# Patient Record
Sex: Female | Born: 1935 | Race: White | Hispanic: No | Smoking: Former smoker
Health system: Southern US, Community
[De-identification: ages and names within clinical notes are randomized; demographics above are authoritative.]

## PROBLEM LIST (undated history)

## (undated) DIAGNOSIS — E785 Hyperlipidemia, unspecified: Secondary | ICD-10-CM

## (undated) DIAGNOSIS — K602 Anal fissure, unspecified: Secondary | ICD-10-CM

## (undated) DIAGNOSIS — I4819 Other persistent atrial fibrillation: Secondary | ICD-10-CM

## (undated) DIAGNOSIS — K222 Esophageal obstruction: Secondary | ICD-10-CM

## (undated) DIAGNOSIS — G4700 Insomnia, unspecified: Secondary | ICD-10-CM

## (undated) DIAGNOSIS — Z9889 Other specified postprocedural states: Secondary | ICD-10-CM

## (undated) DIAGNOSIS — N289 Disorder of kidney and ureter, unspecified: Secondary | ICD-10-CM

## (undated) DIAGNOSIS — I679 Cerebrovascular disease, unspecified: Secondary | ICD-10-CM

## (undated) DIAGNOSIS — K449 Diaphragmatic hernia without obstruction or gangrene: Secondary | ICD-10-CM

## (undated) DIAGNOSIS — K589 Irritable bowel syndrome without diarrhea: Secondary | ICD-10-CM

## (undated) DIAGNOSIS — M545 Low back pain, unspecified: Secondary | ICD-10-CM

## (undated) DIAGNOSIS — K219 Gastro-esophageal reflux disease without esophagitis: Secondary | ICD-10-CM

## (undated) DIAGNOSIS — K5790 Diverticulosis of intestine, part unspecified, without perforation or abscess without bleeding: Secondary | ICD-10-CM

## (undated) DIAGNOSIS — C44301 Unspecified malignant neoplasm of skin of nose: Secondary | ICD-10-CM

## (undated) DIAGNOSIS — K259 Gastric ulcer, unspecified as acute or chronic, without hemorrhage or perforation: Secondary | ICD-10-CM

## (undated) DIAGNOSIS — M1990 Unspecified osteoarthritis, unspecified site: Secondary | ICD-10-CM

## (undated) DIAGNOSIS — G8929 Other chronic pain: Secondary | ICD-10-CM

## (undated) DIAGNOSIS — I10 Essential (primary) hypertension: Secondary | ICD-10-CM

## (undated) DIAGNOSIS — S82891A Other fracture of right lower leg, initial encounter for closed fracture: Secondary | ICD-10-CM

## (undated) DIAGNOSIS — D126 Benign neoplasm of colon, unspecified: Secondary | ICD-10-CM

## (undated) DIAGNOSIS — J42 Unspecified chronic bronchitis: Secondary | ICD-10-CM

## (undated) DIAGNOSIS — Z9289 Personal history of other medical treatment: Secondary | ICD-10-CM

## (undated) DIAGNOSIS — R011 Cardiac murmur, unspecified: Secondary | ICD-10-CM

## (undated) DIAGNOSIS — R112 Nausea with vomiting, unspecified: Secondary | ICD-10-CM

## (undated) DIAGNOSIS — C649 Malignant neoplasm of unspecified kidney, except renal pelvis: Secondary | ICD-10-CM

## (undated) DIAGNOSIS — F411 Generalized anxiety disorder: Secondary | ICD-10-CM

## (undated) DIAGNOSIS — G629 Polyneuropathy, unspecified: Secondary | ICD-10-CM

## (undated) HISTORY — DX: Unspecified malignant neoplasm of skin of nose: C44.301

## (undated) HISTORY — DX: Malignant neoplasm of unspecified kidney, except renal pelvis: C64.9

## (undated) HISTORY — DX: Disorder of kidney and ureter, unspecified: N28.9

## (undated) HISTORY — DX: Unspecified osteoarthritis, unspecified site: M19.90

## (undated) HISTORY — DX: Esophageal obstruction: K22.2

## (undated) HISTORY — DX: Generalized anxiety disorder: F41.1

## (undated) HISTORY — DX: Personal history of other medical treatment: Z92.89

## (undated) HISTORY — DX: Essential (primary) hypertension: I10

## (undated) HISTORY — PX: SKIN CANCER EXCISION: SHX779

## (undated) HISTORY — PX: OTHER SURGICAL HISTORY: SHX169

## (undated) HISTORY — DX: Benign neoplasm of colon, unspecified: D12.6

## (undated) HISTORY — PX: CHOLECYSTECTOMY: SHX55

## (undated) HISTORY — DX: Polyneuropathy, unspecified: G62.9

## (undated) HISTORY — DX: Hyperlipidemia, unspecified: E78.5

## (undated) HISTORY — DX: Irritable bowel syndrome without diarrhea: K58.9

## (undated) HISTORY — DX: Cerebrovascular disease, unspecified: I67.9

## (undated) HISTORY — PX: TONSILLECTOMY: SUR1361

## (undated) HISTORY — DX: Anal fissure, unspecified: K60.2

## (undated) HISTORY — PX: APPENDECTOMY: SHX54

## (undated) HISTORY — DX: Gastric ulcer, unspecified as acute or chronic, without hemorrhage or perforation: K25.9

## (undated) HISTORY — DX: Diverticulosis of intestine, part unspecified, without perforation or abscess without bleeding: K57.90

## (undated) HISTORY — DX: Insomnia, unspecified: G47.00

## (undated) HISTORY — PX: CATARACT EXTRACTION W/ INTRAOCULAR LENS  IMPLANT, BILATERAL: SHX1307

---

## 1975-10-01 HISTORY — PX: ABDOMINAL HYSTERECTOMY: SHX81

## 1987-10-01 HISTORY — PX: ANTERIOR AND POSTERIOR VAGINAL REPAIR: SUR5

## 1998-04-06 ENCOUNTER — Observation Stay (HOSPITAL_COMMUNITY): Admission: RE | Admit: 1998-04-06 | Discharge: 1998-04-07 | Payer: Self-pay | Admitting: *Deleted

## 1998-04-16 ENCOUNTER — Emergency Department (HOSPITAL_COMMUNITY): Admission: EM | Admit: 1998-04-16 | Discharge: 1998-04-16 | Payer: Self-pay | Admitting: Internal Medicine

## 1999-02-14 ENCOUNTER — Encounter: Payer: Self-pay | Admitting: Gastroenterology

## 1999-02-14 ENCOUNTER — Ambulatory Visit (HOSPITAL_COMMUNITY): Admission: RE | Admit: 1999-02-14 | Discharge: 1999-02-14 | Payer: Self-pay | Admitting: Gastroenterology

## 1999-08-23 ENCOUNTER — Encounter: Payer: Self-pay | Admitting: Emergency Medicine

## 1999-08-23 ENCOUNTER — Emergency Department (HOSPITAL_COMMUNITY): Admission: EM | Admit: 1999-08-23 | Discharge: 1999-08-23 | Payer: Self-pay | Admitting: Emergency Medicine

## 2000-05-08 ENCOUNTER — Encounter: Admission: RE | Admit: 2000-05-08 | Discharge: 2000-05-08 | Payer: Self-pay | Admitting: Pulmonary Disease

## 2000-05-08 ENCOUNTER — Encounter: Payer: Self-pay | Admitting: Pulmonary Disease

## 2000-12-18 ENCOUNTER — Emergency Department (HOSPITAL_COMMUNITY): Admission: EM | Admit: 2000-12-18 | Discharge: 2000-12-19 | Payer: Self-pay | Admitting: Emergency Medicine

## 2002-12-09 ENCOUNTER — Emergency Department (HOSPITAL_COMMUNITY): Admission: EM | Admit: 2002-12-09 | Discharge: 2002-12-10 | Payer: Self-pay | Admitting: Emergency Medicine

## 2003-08-09 ENCOUNTER — Inpatient Hospital Stay (HOSPITAL_COMMUNITY): Admission: RE | Admit: 2003-08-09 | Discharge: 2003-08-10 | Payer: Self-pay | Admitting: Orthopedic Surgery

## 2004-05-31 ENCOUNTER — Emergency Department (HOSPITAL_COMMUNITY): Admission: EM | Admit: 2004-05-31 | Discharge: 2004-05-31 | Payer: Self-pay | Admitting: Emergency Medicine

## 2004-07-31 ENCOUNTER — Ambulatory Visit: Payer: Self-pay | Admitting: Cardiology

## 2004-09-27 ENCOUNTER — Ambulatory Visit: Payer: Self-pay | Admitting: Pulmonary Disease

## 2004-11-05 ENCOUNTER — Ambulatory Visit: Payer: Self-pay | Admitting: Pulmonary Disease

## 2004-11-06 ENCOUNTER — Ambulatory Visit: Payer: Self-pay | Admitting: Cardiology

## 2004-12-11 ENCOUNTER — Ambulatory Visit: Payer: Self-pay | Admitting: Pulmonary Disease

## 2004-12-26 ENCOUNTER — Ambulatory Visit: Payer: Self-pay | Admitting: *Deleted

## 2005-01-07 ENCOUNTER — Ambulatory Visit: Payer: Self-pay | Admitting: Family Medicine

## 2005-01-14 ENCOUNTER — Ambulatory Visit: Payer: Self-pay | Admitting: Cardiology

## 2005-03-27 ENCOUNTER — Ambulatory Visit: Payer: Self-pay | Admitting: Pulmonary Disease

## 2005-04-09 ENCOUNTER — Ambulatory Visit: Payer: Self-pay | Admitting: *Deleted

## 2005-05-07 ENCOUNTER — Ambulatory Visit: Payer: Self-pay | Admitting: Pulmonary Disease

## 2005-05-07 ENCOUNTER — Inpatient Hospital Stay (HOSPITAL_COMMUNITY): Admission: EM | Admit: 2005-05-07 | Discharge: 2005-05-09 | Payer: Self-pay | Admitting: Emergency Medicine

## 2005-05-08 ENCOUNTER — Ambulatory Visit: Payer: Self-pay | Admitting: Cardiology

## 2005-05-09 ENCOUNTER — Encounter: Payer: Self-pay | Admitting: Cardiology

## 2005-05-09 ENCOUNTER — Ambulatory Visit: Payer: Self-pay | Admitting: Pulmonary Disease

## 2005-05-13 ENCOUNTER — Ambulatory Visit: Payer: Self-pay | Admitting: Pulmonary Disease

## 2005-05-17 ENCOUNTER — Ambulatory Visit: Payer: Self-pay | Admitting: Internal Medicine

## 2005-05-21 ENCOUNTER — Ambulatory Visit: Payer: Self-pay | Admitting: Cardiology

## 2005-05-22 ENCOUNTER — Ambulatory Visit: Payer: Self-pay | Admitting: Pulmonary Disease

## 2005-05-29 ENCOUNTER — Ambulatory Visit: Payer: Self-pay

## 2005-06-05 ENCOUNTER — Ambulatory Visit: Payer: Self-pay | Admitting: Cardiology

## 2005-06-11 ENCOUNTER — Ambulatory Visit: Payer: Self-pay | Admitting: Pulmonary Disease

## 2005-06-14 ENCOUNTER — Ambulatory Visit: Payer: Self-pay | Admitting: Internal Medicine

## 2005-06-25 ENCOUNTER — Ambulatory Visit: Payer: Self-pay | Admitting: *Deleted

## 2005-07-01 ENCOUNTER — Ambulatory Visit: Payer: Self-pay | Admitting: Cardiology

## 2005-07-11 ENCOUNTER — Ambulatory Visit: Payer: Self-pay | Admitting: Cardiology

## 2005-07-18 ENCOUNTER — Ambulatory Visit: Payer: Self-pay | Admitting: Cardiology

## 2005-08-01 ENCOUNTER — Ambulatory Visit: Payer: Self-pay | Admitting: Cardiology

## 2005-08-06 ENCOUNTER — Ambulatory Visit: Payer: Self-pay | Admitting: Cardiology

## 2005-08-19 ENCOUNTER — Ambulatory Visit: Payer: Self-pay | Admitting: Cardiology

## 2005-08-19 ENCOUNTER — Ambulatory Visit: Payer: Self-pay | Admitting: Pulmonary Disease

## 2005-08-28 ENCOUNTER — Ambulatory Visit: Payer: Self-pay | Admitting: Pulmonary Disease

## 2005-10-02 ENCOUNTER — Ambulatory Visit: Payer: Self-pay | Admitting: Cardiology

## 2005-10-28 ENCOUNTER — Ambulatory Visit: Payer: Self-pay | Admitting: Pulmonary Disease

## 2005-10-29 ENCOUNTER — Ambulatory Visit: Payer: Self-pay | Admitting: Cardiology

## 2005-10-30 ENCOUNTER — Ambulatory Visit: Payer: Self-pay | Admitting: Cardiology

## 2005-11-08 ENCOUNTER — Encounter: Payer: Self-pay | Admitting: Cardiovascular Disease

## 2005-11-08 ENCOUNTER — Ambulatory Visit: Payer: Self-pay | Admitting: Cardiovascular Disease

## 2005-11-20 ENCOUNTER — Ambulatory Visit: Payer: Self-pay | Admitting: Pulmonary Disease

## 2005-11-22 ENCOUNTER — Ambulatory Visit: Payer: Self-pay | Admitting: Internal Medicine

## 2005-11-27 ENCOUNTER — Ambulatory Visit: Payer: Self-pay | Admitting: Cardiology

## 2005-12-11 ENCOUNTER — Ambulatory Visit: Payer: Self-pay | Admitting: Cardiology

## 2005-12-23 ENCOUNTER — Ambulatory Visit: Payer: Self-pay | Admitting: Pulmonary Disease

## 2005-12-24 ENCOUNTER — Ambulatory Visit (HOSPITAL_COMMUNITY): Admission: RE | Admit: 2005-12-24 | Discharge: 2005-12-24 | Payer: Self-pay | Admitting: Pulmonary Disease

## 2005-12-25 ENCOUNTER — Ambulatory Visit: Payer: Self-pay | Admitting: *Deleted

## 2006-01-15 ENCOUNTER — Ambulatory Visit: Payer: Self-pay | Admitting: *Deleted

## 2006-01-25 ENCOUNTER — Encounter: Admission: RE | Admit: 2006-01-25 | Discharge: 2006-01-25 | Payer: Self-pay | Admitting: Neurology

## 2006-02-10 ENCOUNTER — Ambulatory Visit: Payer: Self-pay | Admitting: Cardiology

## 2006-03-10 ENCOUNTER — Ambulatory Visit: Payer: Self-pay | Admitting: Cardiology

## 2006-04-10 ENCOUNTER — Ambulatory Visit: Payer: Self-pay | Admitting: Cardiology

## 2006-04-22 ENCOUNTER — Ambulatory Visit: Payer: Self-pay | Admitting: Cardiology

## 2006-05-12 ENCOUNTER — Ambulatory Visit: Payer: Self-pay | Admitting: Cardiovascular Disease

## 2006-05-14 ENCOUNTER — Ambulatory Visit: Payer: Self-pay | Admitting: Cardiology

## 2006-05-19 ENCOUNTER — Ambulatory Visit: Payer: Self-pay | Admitting: Internal Medicine

## 2006-05-26 ENCOUNTER — Ambulatory Visit: Payer: Self-pay

## 2006-06-09 ENCOUNTER — Ambulatory Visit: Payer: Self-pay | Admitting: *Deleted

## 2006-06-20 ENCOUNTER — Ambulatory Visit: Payer: Self-pay | Admitting: Cardiology

## 2006-06-21 ENCOUNTER — Emergency Department (HOSPITAL_COMMUNITY): Admission: EM | Admit: 2006-06-21 | Discharge: 2006-06-21 | Payer: Self-pay | Admitting: Emergency Medicine

## 2006-06-24 ENCOUNTER — Ambulatory Visit: Payer: Self-pay | Admitting: Pulmonary Disease

## 2006-06-26 ENCOUNTER — Emergency Department (HOSPITAL_COMMUNITY): Admission: EM | Admit: 2006-06-26 | Discharge: 2006-06-27 | Payer: Self-pay | Admitting: Emergency Medicine

## 2006-07-01 ENCOUNTER — Ambulatory Visit: Payer: Self-pay | Admitting: Pulmonary Disease

## 2006-07-10 ENCOUNTER — Ambulatory Visit: Payer: Self-pay | Admitting: Cardiology

## 2006-08-07 ENCOUNTER — Ambulatory Visit: Payer: Self-pay | Admitting: Internal Medicine

## 2006-08-19 ENCOUNTER — Ambulatory Visit: Payer: Self-pay | Admitting: Internal Medicine

## 2006-08-20 ENCOUNTER — Ambulatory Visit: Payer: Self-pay | Admitting: Internal Medicine

## 2006-08-25 ENCOUNTER — Ambulatory Visit: Payer: Self-pay | Admitting: Cardiology

## 2006-09-05 ENCOUNTER — Ambulatory Visit: Payer: Self-pay | Admitting: Cardiology

## 2006-09-09 ENCOUNTER — Ambulatory Visit: Payer: Self-pay

## 2006-10-02 ENCOUNTER — Ambulatory Visit: Payer: Self-pay | Admitting: Cardiology

## 2006-10-15 ENCOUNTER — Ambulatory Visit: Payer: Self-pay | Admitting: Cardiology

## 2006-10-24 ENCOUNTER — Ambulatory Visit: Payer: Self-pay | Admitting: Internal Medicine

## 2006-11-11 ENCOUNTER — Ambulatory Visit: Payer: Self-pay | Admitting: Cardiology

## 2006-11-14 ENCOUNTER — Ambulatory Visit: Payer: Self-pay | Admitting: Cardiology

## 2006-11-25 ENCOUNTER — Ambulatory Visit: Payer: Self-pay | Admitting: Pulmonary Disease

## 2006-11-25 LAB — CONVERTED CEMR LAB
ALT: 21 units/L (ref 0–40)
AST: 24 units/L (ref 0–37)
Basophils Relative: 0.9 % (ref 0.0–1.0)
Bilirubin, Direct: 0.1 mg/dL (ref 0.0–0.3)
CO2: 30 meq/L (ref 19–32)
Calcium: 9.3 mg/dL (ref 8.4–10.5)
Chloride: 109 meq/L (ref 96–112)
Direct LDL: 138 mg/dL
Eosinophils Absolute: 0.2 10*3/uL (ref 0.0–0.6)
Eosinophils Relative: 3.6 % (ref 0.0–5.0)
GFR calc Af Amer: 69 mL/min
GFR calc non Af Amer: 57 mL/min
Glucose, Bld: 95 mg/dL (ref 70–99)
HCT: 41.1 % (ref 36.0–46.0)
HDL: 40.8 mg/dL (ref >39.0)
Lymphocytes Relative: 26 % (ref 12.0–46.0)
MCV: 91.1 fL (ref 78.0–100.0)
Neutro Abs: 2.9 10*3/uL (ref 1.4–7.7)
Neutrophils Relative %: 57.3 % (ref 43.0–77.0)
Platelets: 167 10*3/uL (ref 150–400)
Sodium: 145 meq/L (ref 135–145)
Total Protein: 6.4 g/dL (ref 6.0–8.3)
VLDL: 46 mg/dL — ABNORMAL HIGH (ref 0–40)
WBC: 5 10*3/uL (ref 4.5–10.5)

## 2006-12-03 ENCOUNTER — Ambulatory Visit: Payer: Self-pay | Admitting: *Deleted

## 2006-12-09 ENCOUNTER — Ambulatory Visit: Payer: Self-pay | Admitting: Pulmonary Disease

## 2006-12-11 ENCOUNTER — Ambulatory Visit: Payer: Self-pay | Admitting: Cardiology

## 2006-12-24 ENCOUNTER — Encounter: Admission: RE | Admit: 2006-12-24 | Discharge: 2006-12-24 | Payer: Self-pay | Admitting: Neurology

## 2007-01-13 ENCOUNTER — Ambulatory Visit: Payer: Self-pay | Admitting: Cardiovascular Disease

## 2007-01-27 ENCOUNTER — Ambulatory Visit: Payer: Self-pay | Admitting: Cardiology

## 2007-02-02 ENCOUNTER — Ambulatory Visit: Payer: Self-pay | Admitting: Cardiology

## 2007-02-16 ENCOUNTER — Ambulatory Visit: Payer: Self-pay | Admitting: Pulmonary Disease

## 2007-02-17 ENCOUNTER — Emergency Department (HOSPITAL_COMMUNITY): Admission: EM | Admit: 2007-02-17 | Discharge: 2007-02-17 | Payer: Self-pay | Admitting: Emergency Medicine

## 2007-02-17 LAB — CONVERTED CEMR LAB
LDL Cholesterol: 103 mg/dL — ABNORMAL HIGH (ref 0–99)
VLDL: 29 mg/dL (ref 0–40)

## 2007-02-19 ENCOUNTER — Ambulatory Visit: Payer: Self-pay | Admitting: Cardiology

## 2007-03-18 ENCOUNTER — Ambulatory Visit: Payer: Self-pay | Admitting: Cardiology

## 2007-04-15 ENCOUNTER — Ambulatory Visit: Payer: Self-pay | Admitting: Cardiovascular Disease

## 2007-05-01 ENCOUNTER — Ambulatory Visit: Payer: Self-pay | Admitting: Cardiology

## 2007-05-15 ENCOUNTER — Ambulatory Visit: Payer: Self-pay | Admitting: Internal Medicine

## 2007-06-12 ENCOUNTER — Ambulatory Visit: Payer: Self-pay | Admitting: Cardiology

## 2007-06-22 ENCOUNTER — Ambulatory Visit: Payer: Self-pay | Admitting: Pulmonary Disease

## 2007-06-23 ENCOUNTER — Ambulatory Visit: Payer: Self-pay | Admitting: Cardiology

## 2007-06-23 ENCOUNTER — Ambulatory Visit: Payer: Self-pay | Admitting: Pulmonary Disease

## 2007-06-23 LAB — CONVERTED CEMR LAB
ALT: 28 units/L (ref 0–35)
Basophils Absolute: 0.1 10*3/uL (ref 0.0–0.1)
Bilirubin, Direct: 0.1 mg/dL (ref 0.0–0.3)
Calcium: 10.4 mg/dL (ref 8.4–10.5)
Eosinophils Absolute: 0.3 10*3/uL (ref 0.0–0.6)
GFR calc Af Amer: 69 mL/min
GFR calc non Af Amer: 57 mL/min
Glucose, Bld: 99 mg/dL (ref 70–99)
HDL: 42.1 mg/dL (ref >39.0)
Hemoglobin, Urine: NEGATIVE
Ketones, ur: NEGATIVE mg/dL
MCHC: 34.3 g/dL (ref 30.0–36.0)
MCV: 91.1 fL (ref 78.0–100.0)
Monocytes Relative: 11.7 % — ABNORMAL HIGH (ref 3.0–11.0)
Neutrophils Relative %: 59.2 % (ref 43.0–77.0)
Platelets: 176 10*3/uL (ref 150–400)
Pro B Natriuretic peptide (BNP): 136 pg/mL — ABNORMAL HIGH (ref 0.0–100.0)
RBC: 4.45 M/uL (ref 3.87–5.11)
RDW: 12.5 % (ref 11.5–14.6)
Sodium: 147 meq/L — ABNORMAL HIGH (ref 135–145)
Specific Gravity, Urine: 1.01 (ref 1.000–1.03)
TSH: 4.93 microintl units/mL (ref 0.35–5.50)
Total CHOL/HDL Ratio: 4.5
Total Protein, Urine: NEGATIVE mg/dL
Triglycerides: 337 mg/dL (ref 0–149)
pH: 6.5 (ref 5.0–8.0)

## 2007-07-10 ENCOUNTER — Ambulatory Visit: Payer: Self-pay | Admitting: Internal Medicine

## 2007-07-29 ENCOUNTER — Ambulatory Visit: Payer: Self-pay | Admitting: Pulmonary Disease

## 2007-07-29 DIAGNOSIS — M1990 Unspecified osteoarthritis, unspecified site: Secondary | ICD-10-CM | POA: Insufficient documentation

## 2007-07-29 DIAGNOSIS — I4891 Unspecified atrial fibrillation: Secondary | ICD-10-CM

## 2007-07-29 DIAGNOSIS — K219 Gastro-esophageal reflux disease without esophagitis: Secondary | ICD-10-CM

## 2007-07-29 DIAGNOSIS — I10 Essential (primary) hypertension: Secondary | ICD-10-CM | POA: Insufficient documentation

## 2007-07-29 DIAGNOSIS — F411 Generalized anxiety disorder: Secondary | ICD-10-CM | POA: Insufficient documentation

## 2007-07-29 DIAGNOSIS — E7800 Pure hypercholesterolemia, unspecified: Secondary | ICD-10-CM

## 2007-08-10 ENCOUNTER — Ambulatory Visit: Payer: Self-pay | Admitting: Internal Medicine

## 2007-08-19 ENCOUNTER — Ambulatory Visit: Payer: Self-pay | Admitting: Cardiology

## 2007-08-19 LAB — CONVERTED CEMR LAB: Free T4: 0.7 ng/dL (ref 0.6–1.6)

## 2007-08-25 ENCOUNTER — Ambulatory Visit: Payer: Self-pay | Admitting: Pulmonary Disease

## 2007-09-07 ENCOUNTER — Telehealth: Payer: Self-pay | Admitting: Pulmonary Disease

## 2007-09-10 ENCOUNTER — Ambulatory Visit: Payer: Self-pay | Admitting: Pulmonary Disease

## 2007-09-11 ENCOUNTER — Ambulatory Visit: Payer: Self-pay | Admitting: Cardiology

## 2007-09-18 ENCOUNTER — Ambulatory Visit: Payer: Self-pay | Admitting: Cardiology

## 2007-10-09 ENCOUNTER — Telehealth (INDEPENDENT_AMBULATORY_CARE_PROVIDER_SITE_OTHER): Payer: Self-pay | Admitting: *Deleted

## 2007-10-16 ENCOUNTER — Ambulatory Visit: Payer: Self-pay | Admitting: Cardiology

## 2007-10-30 ENCOUNTER — Ambulatory Visit: Payer: Self-pay | Admitting: Pulmonary Disease

## 2007-11-06 ENCOUNTER — Ambulatory Visit: Payer: Self-pay | Admitting: Pulmonary Disease

## 2007-11-13 ENCOUNTER — Ambulatory Visit: Payer: Self-pay | Admitting: Internal Medicine

## 2007-11-27 ENCOUNTER — Ambulatory Visit: Payer: Self-pay | Admitting: Internal Medicine

## 2007-12-11 ENCOUNTER — Ambulatory Visit: Payer: Self-pay | Admitting: Internal Medicine

## 2007-12-18 ENCOUNTER — Ambulatory Visit: Payer: Self-pay | Admitting: Cardiovascular Disease

## 2008-01-10 ENCOUNTER — Emergency Department (HOSPITAL_COMMUNITY): Admission: EM | Admit: 2008-01-10 | Discharge: 2008-01-10 | Payer: Self-pay | Admitting: Emergency Medicine

## 2008-02-08 ENCOUNTER — Ambulatory Visit: Payer: Self-pay | Admitting: Cardiology

## 2008-02-10 ENCOUNTER — Ambulatory Visit: Payer: Self-pay | Admitting: Cardiology

## 2008-02-12 ENCOUNTER — Ambulatory Visit: Payer: Self-pay | Admitting: Pulmonary Disease

## 2008-02-12 LAB — CONVERTED CEMR LAB
Crystals: NEGATIVE
Ketones, ur: NEGATIVE mg/dL
Urine Glucose: NEGATIVE mg/dL
Urobilinogen, UA: 0.2 (ref 0.0–1.0)

## 2008-02-13 ENCOUNTER — Encounter: Payer: Self-pay | Admitting: Adult Health

## 2008-02-15 ENCOUNTER — Ambulatory Visit: Payer: Self-pay | Admitting: Internal Medicine

## 2008-02-18 ENCOUNTER — Ambulatory Visit: Payer: Self-pay | Admitting: Internal Medicine

## 2008-02-23 ENCOUNTER — Encounter: Payer: Self-pay | Admitting: Pulmonary Disease

## 2008-03-10 ENCOUNTER — Ambulatory Visit: Payer: Self-pay | Admitting: Cardiology

## 2008-03-15 ENCOUNTER — Telehealth (INDEPENDENT_AMBULATORY_CARE_PROVIDER_SITE_OTHER): Payer: Self-pay | Admitting: *Deleted

## 2008-04-07 ENCOUNTER — Ambulatory Visit: Payer: Self-pay | Admitting: Cardiology

## 2008-04-28 ENCOUNTER — Ambulatory Visit: Payer: Self-pay | Admitting: Cardiology

## 2008-05-05 ENCOUNTER — Ambulatory Visit: Payer: Self-pay | Admitting: Internal Medicine

## 2008-05-19 ENCOUNTER — Ambulatory Visit: Payer: Self-pay | Admitting: Internal Medicine

## 2008-06-16 ENCOUNTER — Ambulatory Visit: Payer: Self-pay | Admitting: Cardiology

## 2008-07-14 ENCOUNTER — Ambulatory Visit: Payer: Self-pay | Admitting: Internal Medicine

## 2008-07-28 ENCOUNTER — Ambulatory Visit: Payer: Self-pay | Admitting: Cardiovascular Disease

## 2008-08-04 ENCOUNTER — Ambulatory Visit: Payer: Self-pay | Admitting: Cardiology

## 2008-08-17 ENCOUNTER — Telehealth (INDEPENDENT_AMBULATORY_CARE_PROVIDER_SITE_OTHER): Payer: Self-pay | Admitting: *Deleted

## 2008-08-19 ENCOUNTER — Ambulatory Visit: Payer: Self-pay | Admitting: Cardiology

## 2008-09-02 ENCOUNTER — Ambulatory Visit: Payer: Self-pay | Admitting: Pulmonary Disease

## 2008-09-02 DIAGNOSIS — K5900 Constipation, unspecified: Secondary | ICD-10-CM | POA: Insufficient documentation

## 2008-09-02 DIAGNOSIS — N289 Disorder of kidney and ureter, unspecified: Secondary | ICD-10-CM | POA: Insufficient documentation

## 2008-09-03 DIAGNOSIS — I679 Cerebrovascular disease, unspecified: Secondary | ICD-10-CM

## 2008-09-08 ENCOUNTER — Ambulatory Visit: Payer: Self-pay | Admitting: Pulmonary Disease

## 2008-09-14 LAB — CONVERTED CEMR LAB
ALT: 23 units/L (ref 0–35)
AST: 22 units/L (ref 0–37)
Alkaline Phosphatase: 40 units/L (ref 39–117)
Basophils Absolute: 0 10*3/uL (ref 0.0–0.1)
Basophils Relative: 0.2 % (ref 0.0–3.0)
Bilirubin, Direct: 0.1 mg/dL (ref 0.0–0.3)
CO2: 30 meq/L (ref 19–32)
Chloride: 109 meq/L (ref 96–112)
Creatinine, Ser: 1 mg/dL (ref 0.4–1.2)
Ketones, ur: NEGATIVE mg/dL
LDL Cholesterol: 82 mg/dL (ref 0–99)
Leukocytes, UA: NEGATIVE
Lymphocytes Relative: 17.4 % (ref 12.0–46.0)
MCHC: 34.6 g/dL (ref 30.0–36.0)
Neutrophils Relative %: 70.6 % (ref 43.0–77.0)
Platelets: 158 10*3/uL (ref 150–400)
Potassium: 4.1 meq/L (ref 3.5–5.1)
RBC: 4.19 M/uL (ref 3.87–5.11)
Sodium: 143 meq/L (ref 135–145)
Specific Gravity, Urine: 1.02 (ref 1.000–1.03)
Total Bilirubin: 0.8 mg/dL (ref 0.3–1.2)
Total CHOL/HDL Ratio: 3.2
Triglycerides: 85 mg/dL (ref 0–149)
Urobilinogen, UA: 0.2 (ref 0.0–1.0)

## 2008-09-16 ENCOUNTER — Ambulatory Visit: Payer: Self-pay | Admitting: Cardiovascular Disease

## 2008-09-26 ENCOUNTER — Telehealth: Payer: Self-pay | Admitting: Pulmonary Disease

## 2008-09-26 ENCOUNTER — Telehealth (INDEPENDENT_AMBULATORY_CARE_PROVIDER_SITE_OTHER): Payer: Self-pay | Admitting: *Deleted

## 2008-10-13 ENCOUNTER — Ambulatory Visit: Payer: Self-pay | Admitting: Cardiovascular Disease

## 2008-10-25 ENCOUNTER — Telehealth (INDEPENDENT_AMBULATORY_CARE_PROVIDER_SITE_OTHER): Payer: Self-pay | Admitting: *Deleted

## 2008-11-08 ENCOUNTER — Ambulatory Visit: Payer: Self-pay | Admitting: Cardiology

## 2008-11-14 ENCOUNTER — Telehealth (INDEPENDENT_AMBULATORY_CARE_PROVIDER_SITE_OTHER): Payer: Self-pay | Admitting: *Deleted

## 2008-11-14 ENCOUNTER — Ambulatory Visit: Payer: Self-pay | Admitting: Internal Medicine

## 2008-11-14 ENCOUNTER — Ambulatory Visit: Payer: Self-pay | Admitting: Pulmonary Disease

## 2008-11-16 ENCOUNTER — Encounter: Payer: Self-pay | Admitting: Pulmonary Disease

## 2008-11-16 ENCOUNTER — Ambulatory Visit (HOSPITAL_COMMUNITY): Admission: RE | Admit: 2008-11-16 | Discharge: 2008-11-16 | Payer: Self-pay | Admitting: Urology

## 2008-11-28 ENCOUNTER — Ambulatory Visit: Payer: Self-pay | Admitting: Pulmonary Disease

## 2008-11-30 ENCOUNTER — Encounter: Payer: Self-pay | Admitting: Pulmonary Disease

## 2008-11-30 ENCOUNTER — Encounter: Admission: RE | Admit: 2008-11-30 | Discharge: 2008-11-30 | Payer: Self-pay | Admitting: Urology

## 2008-11-30 LAB — CONVERTED CEMR LAB
INR: 3 — ABNORMAL HIGH (ref 0.8–1.0)
Prothrombin Time: 30.6 s — ABNORMAL HIGH (ref 10.9–13.3)

## 2008-12-06 ENCOUNTER — Ambulatory Visit: Payer: Self-pay | Admitting: Cardiology

## 2008-12-13 ENCOUNTER — Ambulatory Visit: Payer: Self-pay | Admitting: Internal Medicine

## 2008-12-20 ENCOUNTER — Ambulatory Visit: Payer: Self-pay | Admitting: Cardiology

## 2008-12-20 ENCOUNTER — Ambulatory Visit: Payer: Self-pay | Admitting: Cardiovascular Disease

## 2009-01-05 ENCOUNTER — Telehealth (INDEPENDENT_AMBULATORY_CARE_PROVIDER_SITE_OTHER): Payer: Self-pay | Admitting: *Deleted

## 2009-01-06 ENCOUNTER — Ambulatory Visit (HOSPITAL_COMMUNITY): Admission: RE | Admit: 2009-01-06 | Discharge: 2009-01-07 | Payer: Self-pay | Admitting: Interventional Radiology

## 2009-01-06 ENCOUNTER — Encounter: Payer: Self-pay | Admitting: Pulmonary Disease

## 2009-01-06 ENCOUNTER — Encounter (INDEPENDENT_AMBULATORY_CARE_PROVIDER_SITE_OTHER): Payer: Self-pay | Admitting: Interventional Radiology

## 2009-01-11 ENCOUNTER — Encounter: Admission: RE | Admit: 2009-01-11 | Discharge: 2009-01-11 | Payer: Self-pay | Admitting: Interventional Radiology

## 2009-01-13 ENCOUNTER — Ambulatory Visit: Payer: Self-pay | Admitting: Internal Medicine

## 2009-01-20 ENCOUNTER — Ambulatory Visit: Payer: Self-pay | Admitting: Cardiology

## 2009-02-16 ENCOUNTER — Ambulatory Visit: Payer: Self-pay | Admitting: Internal Medicine

## 2009-02-21 ENCOUNTER — Encounter: Admission: RE | Admit: 2009-02-21 | Discharge: 2009-02-21 | Payer: Self-pay | Admitting: Interventional Radiology

## 2009-02-21 ENCOUNTER — Encounter: Payer: Self-pay | Admitting: Cardiology

## 2009-02-21 ENCOUNTER — Encounter: Admission: RE | Admit: 2009-02-21 | Discharge: 2009-02-21 | Payer: Self-pay | Admitting: Urology

## 2009-03-01 ENCOUNTER — Encounter: Payer: Self-pay | Admitting: *Deleted

## 2009-03-13 ENCOUNTER — Ambulatory Visit: Payer: Self-pay | Admitting: Cardiology

## 2009-03-13 LAB — CONVERTED CEMR LAB
POC INR: 2.8
Protime: 20.4

## 2009-03-22 ENCOUNTER — Encounter: Payer: Self-pay | Admitting: Pulmonary Disease

## 2009-04-05 ENCOUNTER — Encounter: Payer: Self-pay | Admitting: *Deleted

## 2009-04-14 ENCOUNTER — Telehealth: Payer: Self-pay | Admitting: Adult Health

## 2009-04-14 ENCOUNTER — Telehealth (INDEPENDENT_AMBULATORY_CARE_PROVIDER_SITE_OTHER): Payer: Self-pay | Admitting: *Deleted

## 2009-04-17 ENCOUNTER — Ambulatory Visit: Payer: Self-pay | Admitting: Adult Health

## 2009-04-17 ENCOUNTER — Ambulatory Visit: Payer: Self-pay | Admitting: Pulmonary Disease

## 2009-04-17 LAB — CONVERTED CEMR LAB
Basophils Absolute: 0 10*3/uL (ref 0.0–0.1)
Eosinophils Relative: 3.6 % (ref 0.0–5.0)
INR: 2.2 — ABNORMAL HIGH (ref 0.8–1.0)
Monocytes Relative: 12.9 % — ABNORMAL HIGH (ref 3.0–12.0)
Neutrophils Relative %: 67.5 % (ref 43.0–77.0)
Platelets: 164 10*3/uL (ref 150.0–400.0)
Prothrombin Time: 23.1 s — ABNORMAL HIGH (ref 10.9–13.3)
RDW: 12.6 % (ref 11.5–14.6)
WBC: 6.7 10*3/uL (ref 4.5–10.5)

## 2009-04-21 ENCOUNTER — Ambulatory Visit: Payer: Self-pay | Admitting: Internal Medicine

## 2009-04-21 LAB — CONVERTED CEMR LAB: Prothrombin Time: 13.8 s

## 2009-04-24 ENCOUNTER — Encounter: Payer: Self-pay | Admitting: Pulmonary Disease

## 2009-04-28 ENCOUNTER — Encounter (INDEPENDENT_AMBULATORY_CARE_PROVIDER_SITE_OTHER): Payer: Self-pay | Admitting: *Deleted

## 2009-05-01 ENCOUNTER — Ambulatory Visit: Payer: Self-pay | Admitting: Internal Medicine

## 2009-05-01 LAB — CONVERTED CEMR LAB: Prothrombin Time: 18.4 s

## 2009-05-22 ENCOUNTER — Ambulatory Visit: Payer: Self-pay | Admitting: Cardiovascular Disease

## 2009-06-06 ENCOUNTER — Ambulatory Visit: Payer: Self-pay | Admitting: Cardiology

## 2009-06-06 LAB — CONVERTED CEMR LAB: POC INR: 3.1

## 2009-06-20 ENCOUNTER — Ambulatory Visit: Payer: Self-pay | Admitting: Cardiology

## 2009-06-27 ENCOUNTER — Ambulatory Visit: Payer: Self-pay | Admitting: Cardiology

## 2009-07-14 ENCOUNTER — Telehealth: Payer: Self-pay | Admitting: Pulmonary Disease

## 2009-07-18 ENCOUNTER — Encounter: Admission: RE | Admit: 2009-07-18 | Discharge: 2009-07-18 | Payer: Self-pay | Admitting: Interventional Radiology

## 2009-07-18 ENCOUNTER — Ambulatory Visit (HOSPITAL_COMMUNITY): Admission: RE | Admit: 2009-07-18 | Discharge: 2009-07-18 | Payer: Self-pay | Admitting: Interventional Radiology

## 2009-07-21 ENCOUNTER — Ambulatory Visit: Payer: Self-pay | Admitting: Cardiology

## 2009-07-21 LAB — CONVERTED CEMR LAB: POC INR: 2.8

## 2009-07-25 ENCOUNTER — Telehealth: Payer: Self-pay | Admitting: Cardiology

## 2009-07-31 HISTORY — PX: RENAL CRYOABLATION: SHX2322

## 2009-08-14 ENCOUNTER — Telehealth (INDEPENDENT_AMBULATORY_CARE_PROVIDER_SITE_OTHER): Payer: Self-pay | Admitting: *Deleted

## 2009-08-18 ENCOUNTER — Ambulatory Visit (HOSPITAL_COMMUNITY): Admission: RE | Admit: 2009-08-18 | Discharge: 2009-08-19 | Payer: Self-pay | Admitting: Interventional Radiology

## 2009-08-22 ENCOUNTER — Telehealth: Payer: Self-pay | Admitting: Cardiology

## 2009-08-28 ENCOUNTER — Ambulatory Visit: Payer: Self-pay | Admitting: Cardiology

## 2009-08-28 LAB — CONVERTED CEMR LAB
INR: 3.5
POC INR: 3.5

## 2009-09-04 ENCOUNTER — Ambulatory Visit: Payer: Self-pay | Admitting: Cardiology

## 2009-09-05 LAB — CONVERTED CEMR LAB
BUN: 16 mg/dL (ref 6–23)
Chloride: 104 meq/L (ref 96–112)
Creatinine, Ser: 1 mg/dL (ref 0.4–1.2)
GFR calc non Af Amer: 56.28 mL/min (ref >60)

## 2009-09-11 ENCOUNTER — Ambulatory Visit: Payer: Self-pay | Admitting: Cardiovascular Disease

## 2009-09-11 LAB — CONVERTED CEMR LAB
INR: 5.4
POC INR: 5.4

## 2009-09-12 ENCOUNTER — Ambulatory Visit (HOSPITAL_COMMUNITY): Admission: RE | Admit: 2009-09-12 | Discharge: 2009-09-12 | Payer: Self-pay | Admitting: Interventional Radiology

## 2009-09-12 ENCOUNTER — Encounter: Admission: RE | Admit: 2009-09-12 | Discharge: 2009-09-12 | Payer: Self-pay | Admitting: Interventional Radiology

## 2009-09-27 ENCOUNTER — Ambulatory Visit: Payer: Self-pay | Admitting: Cardiology

## 2009-10-17 ENCOUNTER — Ambulatory Visit: Payer: Self-pay | Admitting: Cardiology

## 2009-11-07 ENCOUNTER — Ambulatory Visit: Payer: Self-pay | Admitting: Cardiology

## 2009-11-07 LAB — CONVERTED CEMR LAB: POC INR: 2.4

## 2009-11-15 ENCOUNTER — Ambulatory Visit: Payer: Self-pay | Admitting: Pulmonary Disease

## 2009-11-15 DIAGNOSIS — C649 Malignant neoplasm of unspecified kidney, except renal pelvis: Secondary | ICD-10-CM | POA: Insufficient documentation

## 2009-11-15 DIAGNOSIS — R490 Dysphonia: Secondary | ICD-10-CM

## 2009-11-17 ENCOUNTER — Ambulatory Visit: Payer: Self-pay | Admitting: Pulmonary Disease

## 2009-11-19 LAB — CONVERTED CEMR LAB
ALT: 21 units/L (ref 0–35)
AST: 23 units/L (ref 0–37)
Alkaline Phosphatase: 48 units/L (ref 39–117)
BUN: 19 mg/dL (ref 6–23)
Bilirubin, Direct: 0.1 mg/dL (ref 0.0–0.3)
Chloride: 107 meq/L (ref 96–112)
Eosinophils Absolute: 0.2 10*3/uL (ref 0.0–0.7)
Eosinophils Relative: 3.2 % (ref 0.0–5.0)
GFR calc non Af Amer: 63.53 mL/min (ref >60)
HCT: 40.8 % (ref 36.0–46.0)
HDL: 45.7 mg/dL (ref >39.00)
Lymphs Abs: 1.1 10*3/uL (ref 0.7–4.0)
MCHC: 33.8 g/dL (ref 30.0–36.0)
MCV: 93.5 fL (ref 78.0–100.0)
Monocytes Absolute: 0.7 10*3/uL (ref 0.1–1.0)
Neutrophils Relative %: 64.8 % (ref 43.0–77.0)
Platelets: 171 10*3/uL (ref 150.0–400.0)
Potassium: 4.4 meq/L (ref 3.5–5.1)
RDW: 12.6 % (ref 11.5–14.6)
Sed Rate: 17 mm/hr (ref 0–22)
Sodium: 142 meq/L (ref 135–145)
TSH: 1.78 microintl units/mL (ref 0.35–5.50)
Total Bilirubin: 0.4 mg/dL (ref 0.3–1.2)
Total CHOL/HDL Ratio: 3
VLDL: 50.2 mg/dL — ABNORMAL HIGH (ref 0.0–40.0)
WBC: 5.7 10*3/uL (ref 4.5–10.5)

## 2009-11-27 ENCOUNTER — Encounter: Payer: Self-pay | Admitting: Pulmonary Disease

## 2009-11-28 ENCOUNTER — Ambulatory Visit: Payer: Self-pay | Admitting: Cardiology

## 2009-12-05 ENCOUNTER — Ambulatory Visit: Payer: Self-pay | Admitting: Cardiology

## 2009-12-12 ENCOUNTER — Telehealth: Payer: Self-pay | Admitting: Cardiology

## 2009-12-22 ENCOUNTER — Telehealth: Payer: Self-pay | Admitting: Cardiology

## 2009-12-29 ENCOUNTER — Telehealth: Payer: Self-pay | Admitting: Pulmonary Disease

## 2010-01-02 ENCOUNTER — Ambulatory Visit: Payer: Self-pay | Admitting: Internal Medicine

## 2010-01-02 LAB — CONVERTED CEMR LAB: POC INR: 1.7

## 2010-01-26 ENCOUNTER — Encounter: Payer: Self-pay | Admitting: Cardiology

## 2010-01-29 ENCOUNTER — Ambulatory Visit: Payer: Self-pay | Admitting: Cardiology

## 2010-02-14 ENCOUNTER — Telehealth (INDEPENDENT_AMBULATORY_CARE_PROVIDER_SITE_OTHER): Payer: Self-pay | Admitting: *Deleted

## 2010-02-16 ENCOUNTER — Ambulatory Visit: Payer: Self-pay | Admitting: Internal Medicine

## 2010-02-27 ENCOUNTER — Ambulatory Visit: Payer: Self-pay | Admitting: Pulmonary Disease

## 2010-03-04 LAB — CONVERTED CEMR LAB
Albumin: 3.8 g/dL (ref 3.5–5.2)
Bilirubin, Direct: 0.1 mg/dL (ref 0.0–0.3)
Chloride: 105 meq/L (ref 96–112)
Cholesterol: 155 mg/dL (ref 0–200)
GFR calc non Af Amer: 61.89 mL/min (ref >60)
Glucose, Bld: 88 mg/dL (ref 70–99)
HDL: 40.9 mg/dL (ref >39.00)
Potassium: 4.1 meq/L (ref 3.5–5.1)
Sodium: 142 meq/L (ref 135–145)
Total CHOL/HDL Ratio: 4
Total Protein: 6.7 g/dL (ref 6.0–8.3)
VLDL: 41.2 mg/dL — ABNORMAL HIGH (ref 0.0–40.0)

## 2010-03-07 ENCOUNTER — Encounter: Admission: RE | Admit: 2010-03-07 | Discharge: 2010-03-07 | Payer: Self-pay | Admitting: Interventional Radiology

## 2010-03-07 ENCOUNTER — Encounter: Payer: Self-pay | Admitting: Cardiology

## 2010-03-07 ENCOUNTER — Ambulatory Visit (HOSPITAL_COMMUNITY): Admission: RE | Admit: 2010-03-07 | Discharge: 2010-03-07 | Payer: Self-pay | Admitting: Interventional Radiology

## 2010-03-09 ENCOUNTER — Telehealth: Payer: Self-pay | Admitting: Pulmonary Disease

## 2010-03-20 ENCOUNTER — Telehealth (INDEPENDENT_AMBULATORY_CARE_PROVIDER_SITE_OTHER): Payer: Self-pay | Admitting: *Deleted

## 2010-03-23 ENCOUNTER — Ambulatory Visit: Payer: Self-pay | Admitting: Internal Medicine

## 2010-03-23 LAB — CONVERTED CEMR LAB: POC INR: 1

## 2010-04-04 ENCOUNTER — Ambulatory Visit: Payer: Self-pay | Admitting: Internal Medicine

## 2010-04-04 LAB — CONVERTED CEMR LAB: POC INR: 1.6

## 2010-04-18 ENCOUNTER — Ambulatory Visit: Payer: Self-pay | Admitting: Cardiology

## 2010-04-18 LAB — CONVERTED CEMR LAB: POC INR: 2.1

## 2010-05-18 ENCOUNTER — Ambulatory Visit: Payer: Self-pay | Admitting: Cardiology

## 2010-05-23 ENCOUNTER — Telehealth: Payer: Self-pay | Admitting: Cardiology

## 2010-06-11 ENCOUNTER — Telehealth (INDEPENDENT_AMBULATORY_CARE_PROVIDER_SITE_OTHER): Payer: Self-pay | Admitting: *Deleted

## 2010-06-15 ENCOUNTER — Ambulatory Visit: Payer: Self-pay | Admitting: Cardiology

## 2010-06-18 ENCOUNTER — Ambulatory Visit: Payer: Self-pay | Admitting: Cardiology

## 2010-06-19 ENCOUNTER — Telehealth (INDEPENDENT_AMBULATORY_CARE_PROVIDER_SITE_OTHER): Payer: Self-pay | Admitting: *Deleted

## 2010-06-22 ENCOUNTER — Ambulatory Visit: Payer: Self-pay | Admitting: Cardiology

## 2010-06-22 ENCOUNTER — Inpatient Hospital Stay (HOSPITAL_COMMUNITY): Admission: AD | Admit: 2010-06-22 | Discharge: 2010-06-27 | Payer: Self-pay | Admitting: Cardiology

## 2010-06-22 ENCOUNTER — Encounter: Payer: Self-pay | Admitting: Cardiology

## 2010-06-26 ENCOUNTER — Encounter: Payer: Self-pay | Admitting: Cardiovascular Disease

## 2010-06-28 ENCOUNTER — Telehealth: Payer: Self-pay | Admitting: Cardiology

## 2010-07-03 ENCOUNTER — Telehealth: Payer: Self-pay | Admitting: Cardiology

## 2010-07-04 ENCOUNTER — Ambulatory Visit: Payer: Self-pay | Admitting: Cardiovascular Disease

## 2010-07-04 ENCOUNTER — Encounter: Payer: Self-pay | Admitting: Physician Assistant

## 2010-07-11 ENCOUNTER — Telehealth: Payer: Self-pay | Admitting: Cardiology

## 2010-07-19 ENCOUNTER — Ambulatory Visit: Payer: Self-pay | Admitting: Cardiovascular Disease

## 2010-07-19 ENCOUNTER — Ambulatory Visit: Payer: Self-pay | Admitting: Cardiology

## 2010-07-19 ENCOUNTER — Encounter: Payer: Self-pay | Admitting: Cardiology

## 2010-07-21 ENCOUNTER — Telehealth: Payer: Self-pay | Admitting: Cardiology

## 2010-07-30 ENCOUNTER — Telehealth: Payer: Self-pay | Admitting: Cardiology

## 2010-08-03 ENCOUNTER — Telehealth: Payer: Self-pay | Admitting: Cardiology

## 2010-08-03 ENCOUNTER — Ambulatory Visit: Payer: Self-pay | Admitting: Cardiology

## 2010-08-03 ENCOUNTER — Inpatient Hospital Stay (HOSPITAL_COMMUNITY): Admission: AD | Admit: 2010-08-03 | Discharge: 2010-08-07 | Payer: Self-pay | Admitting: Cardiology

## 2010-08-10 ENCOUNTER — Ambulatory Visit: Payer: Self-pay | Admitting: Cardiology

## 2010-08-16 ENCOUNTER — Telehealth: Payer: Self-pay | Admitting: Cardiology

## 2010-08-17 ENCOUNTER — Ambulatory Visit: Payer: Self-pay | Admitting: Cardiology

## 2010-08-20 ENCOUNTER — Telehealth: Payer: Self-pay | Admitting: Internal Medicine

## 2010-08-27 ENCOUNTER — Ambulatory Visit: Payer: Self-pay | Admitting: Internal Medicine

## 2010-08-27 ENCOUNTER — Telehealth: Payer: Self-pay | Admitting: Internal Medicine

## 2010-08-28 ENCOUNTER — Ambulatory Visit: Payer: Self-pay | Admitting: Cardiology

## 2010-08-29 LAB — CONVERTED CEMR LAB
BUN: 24 mg/dL — ABNORMAL HIGH (ref 6–23)
CO2: 27 meq/L (ref 19–32)
Chloride: 104 meq/L (ref 96–112)
Eosinophils Absolute: 0.2 10*3/uL (ref 0.0–0.7)
Eosinophils Relative: 3 % (ref 0–5)
Glucose, Bld: 97 mg/dL (ref 70–99)
HCT: 42.7 % (ref 36.0–46.0)
Hemoglobin: 13.9 g/dL (ref 12.0–15.0)
INR: 0.94 (ref <1.50)
Lymphs Abs: 1.6 10*3/uL (ref 0.7–4.0)
MCV: 94.5 fL (ref 78.0–100.0)
Monocytes Absolute: 1.2 10*3/uL — ABNORMAL HIGH (ref 0.1–1.0)
Monocytes Relative: 11 % (ref 3–12)
Potassium: 4.8 meq/L (ref 3.5–5.3)
RBC: 4.52 M/uL (ref 3.87–5.11)
WBC: 9 10*3/uL (ref 4.0–10.5)

## 2010-08-31 ENCOUNTER — Ambulatory Visit (HOSPITAL_COMMUNITY)
Admission: RE | Admit: 2010-08-31 | Discharge: 2010-08-31 | Payer: Self-pay | Source: Home / Self Care | Admitting: Interventional Radiology

## 2010-09-04 ENCOUNTER — Ambulatory Visit: Payer: Self-pay | Admitting: Cardiovascular Disease

## 2010-09-04 ENCOUNTER — Encounter: Admission: RE | Admit: 2010-09-04 | Discharge: 2010-09-04 | Payer: Self-pay | Admitting: Interventional Radiology

## 2010-09-04 LAB — CONVERTED CEMR LAB: POC INR: 3.2

## 2010-09-07 ENCOUNTER — Ambulatory Visit: Payer: Self-pay | Admitting: Internal Medicine

## 2010-09-07 ENCOUNTER — Encounter: Payer: Self-pay | Admitting: Internal Medicine

## 2010-09-07 DIAGNOSIS — I495 Sick sinus syndrome: Secondary | ICD-10-CM

## 2010-09-10 ENCOUNTER — Ambulatory Visit: Payer: Self-pay | Admitting: Internal Medicine

## 2010-09-10 ENCOUNTER — Ambulatory Visit (HOSPITAL_COMMUNITY)
Admission: RE | Admit: 2010-09-10 | Discharge: 2010-09-10 | Payer: Self-pay | Source: Home / Self Care | Attending: Cardiology | Admitting: Cardiology

## 2010-09-12 ENCOUNTER — Telehealth: Payer: Self-pay | Admitting: Internal Medicine

## 2010-09-13 ENCOUNTER — Telehealth: Payer: Self-pay | Admitting: Internal Medicine

## 2010-09-21 ENCOUNTER — Ambulatory Visit: Payer: Self-pay

## 2010-09-27 ENCOUNTER — Ambulatory Visit: Admission: RE | Admit: 2010-09-27 | Discharge: 2010-09-27 | Payer: Self-pay | Source: Home / Self Care

## 2010-10-08 ENCOUNTER — Ambulatory Visit: Admission: RE | Admit: 2010-10-08 | Discharge: 2010-10-08 | Payer: Self-pay | Source: Home / Self Care

## 2010-10-08 LAB — CONVERTED CEMR LAB: POC INR: 3

## 2010-10-15 ENCOUNTER — Encounter: Payer: Self-pay | Admitting: Cardiology

## 2010-10-15 ENCOUNTER — Ambulatory Visit
Admission: RE | Admit: 2010-10-15 | Discharge: 2010-10-15 | Payer: Self-pay | Source: Home / Self Care | Attending: Cardiology | Admitting: Cardiology

## 2010-10-17 ENCOUNTER — Ambulatory Visit
Admission: RE | Admit: 2010-10-17 | Discharge: 2010-10-17 | Payer: Self-pay | Source: Home / Self Care | Attending: Internal Medicine | Admitting: Internal Medicine

## 2010-10-17 ENCOUNTER — Encounter: Payer: Self-pay | Admitting: Internal Medicine

## 2010-10-21 ENCOUNTER — Encounter: Payer: Self-pay | Admitting: Urology

## 2010-10-22 ENCOUNTER — Ambulatory Visit: Admission: RE | Admit: 2010-10-22 | Discharge: 2010-10-22 | Payer: Self-pay | Source: Home / Self Care

## 2010-10-29 ENCOUNTER — Ambulatory Visit: Admission: RE | Admit: 2010-10-29 | Discharge: 2010-10-29 | Payer: Self-pay | Source: Home / Self Care

## 2010-10-30 NOTE — Assessment & Plan Note (Signed)
Summary: 6 mo f/u./cy  Medications Added ASPIRIN ADULT LOW STRENGTH 81 MG TBEC (ASPIRIN) 1 tab once daily        Visit Type:  6 mo f/u Primary Anne Hayes:  Dr. Pete Hayes  CC:  sob at times....denies any cp or edema.  History of Present Illness: [name] comes in today for evaluation management of her paroxysmal atrial fibrillation.  She denies any chest pain today or any shortness of breath. She does note that her heart goes in and out of atrial fib. She is currently made to fit in the 90s.  She has been on flecainide 100 mg twice a day for some time. She began to have more frequent breakthroughs.  She has a history of normal left ventricular function by 2-D echocardiogram in 2007. At that time showed normal mitral valve function but mild mitral regurgitation. She had some mild left atrial enlargement. She's also had a stress Myoview which showed no ischemia with normal left ventricular function. Please see below.  She denies orthopnea, PND or edema. She is very compliant and her last 2 INRs over the the last 4+ weeks have been therapeutic. Please refer to the Coumadin flow sheet. She denies any bleeding or melena.  She's had some problems with bradycardia in the past,  hence why she's on pindolol.  She does not want to increase the flexion because of severe constipation already.  Clinical Reports Reviewed:  CXR:  11/15/2009: CXR Results:  CHEST - 2 VIEW Comparison: 11/16/2008.   Findings: The cardiopericardial silhouette is enlarged. Pulmonary nodules seen in the left midlung on the previous study is not evident on today's film.  There is diffuse underlying chronic interstitial opacity with chronic features.  This appears slightly progressed in the lung bases.  No pulmonary edema or focal pneumonia. Bones are diffusely demineralized.   IMPRESSION: Cardiomegaly with underlying chronic interstitial changes, slightly progressed in the lung bases.   Read By:  Anne Hayes,  M.D.  Nuclear Study:  09/09/2006:  Excerise capacity: Adenosine study with no exercise  Blood Pressure response:  Clinical symptoms:  ECG impression: No significant ST segment change suggestive of ischemia  Overall impression: There is probable mild breast attenuation but no sign of scar or ischemia.  Anne Millers, MD   01/06/2003:  Final Interpretation: Stress Cardiolite with chest tightness as well as a positive electrocardiographic response. It should be noted there was supraventricular tachycardia with peak exercise that resolved in recovery. The scintigraphic results show no evidence of ischemia or infarction in any vascular territory. The gated ejection fraction was 71%.  Anne Hayes, M.D.; F.A.C.C.  Echocardiogram:  11/08/2005: Echocardiogram Findings:  SUMMARY   -  There were no left ventricular regional wall motion         abnormalities.   -  The aortic valve was mildly calcified.   -  There was mild mitral valvular regurgitation.   -  The left atrium was mildly dilated.    ---------------------------------------------------------------   Prepared and Electronically Authenticated by   Anne Hayes M.D.  05/09/2005: Echocardiogram Findings:   SUMMARY   -  Left ventricular ejection fraction was estimated to be 65 %.         There was no diagnostic evidence of left ventricular regional         wall motion abnormalities.   -  Left atrial size was at the upper limits of normal.   -  There are no significant wall motion abnormalities.   IMPRESSIONS   -  There are no significant wall motion abnormalities.    ---------------------------------------------------------------   Prepared and Electronically Authenticated by   Anne Hayes M.D.   Current Medications (verified): 1)  Coumadin 5 Mg  Tabs (Warfarin Sodium) .... Use As Directed 2)  Aspirin Adult Low Strength 81 Mg Tbec (Aspirin) .[name].. 1 Tab Once Daily 3)  Flecainide Acetate 100 Mg  Tabs (Flecainide  Acetate) .... Take One Tablet Two Times A Day 4)  Pindolol 5 Mg  Tabs (Pindolol) .... Take 1/2 Tablet Two Times A Day 5)  Amlodipine Besylate 2.5 Mg Tabs (Amlodipine Besylate) .... Take One By Mouth Evey Am 6)  Pravastatin Sodium 80 Mg  Tabs (Pravastatin Sodium) .... Take 1 Tablet By Mouth Once A Day 7)  Zetia 10 Mg  Tabs (Ezetimibe) .... Take 1 Tablet By Mouth Once A Day 8)  Fish Oil 1000 Mg  Caps (Omega-3 Fatty Acids) .... Take 1 Cap Once Daily 9)  Coq10 30 Mg Caps (Coenzyme Q10) .... Take 1 Tablet By Mouth Once A Day 10)  Cvs Folic Acid 400 Mcg  Tabs (Folic Acid) .... Take 1 Tablet By Mouth Once A Day 11)  Vitamin D 1000 Unit  Tabs (Cholecalciferol) .... Take 1 Tablet By Mouth Once A Day 12)  Magic Mouthwash .... 1 Tsp Gargle & Swallow 4 Times Daily As Directed...  Allergies: 1)  ! Penicillin 2)  ! 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  Alcohol Use - yes Retired   Risk Factors: Smoking Status: quit (12/20/2008)  Review of Systems       negative other than history of present illness  Vital Signs:  Patient profile:   77 year old female Height:      66.5 inches Weight:      156.12 pounds Pulse rate:   98 / minute Pulse rhythm:   irregular BP  sitting:   108 / 70  (left arm) Cuff size:   large  Vitals Entered By: Anne Hayes, CMA (June 18, 2010 4:10 PM)  Physical Exam  General:  no acute distress, looks younger than stated age Head:  normocephalic and atraumatic Eyes:  PERRLA/EOM intact; conjunctiva and lids normal. Neck:  Neck supple, no JVD. No masses, thyromegaly or abnormal cervical nodes. Chest Wall:  no deformities or breast masses noted Lungs:  Clear bilaterally to auscultation and percussion. Heart:  PMI nondisplaced, irregular rate and rhythm, soft systolic murmur at the apex. No gallop. Carotid upstrokes equal bilaterally without bruits Msk:  decreased ROM.   Pulses:  pulses normal in all 4 extremities Extremities:  No clubbing or cyanosis. Neurologic:  Alert and oriented x 3. Skin:  Intact without lesions or rashes. Psych:  Normal affect.   EKG  Procedure date:  06/18/2010  Findings:      patient for ablation, nonspecific ST segment changes, rate in the mid 90s  Impression & Recommendations:  Problem # 1:  ATRIAL FIBRILLATION (ICD-427.31) Assessment Deteriorated I have recommended that since she  does not want to go up on her flecainide dose, that we discontinue it. Will arrange  for her to come to the hospital this Friday to go on sotalol 120 mg p.o. b.i.d. We will discontinue the pindollol lbecause of a history of bradycardia. She has been therapeutic on her Coumadin for over 4 weeks. If she does not convert after 5 doses, we'll arrange for transesophageal echo cardioversion on Monday. Indications, and to benefit, and all questions have been answered. Hopefully, she will convert and will find a nice balance without a significant bradycardia or tachycardia when she goes into atrial fib. The following medications were removed from the medication list:    Flecainide Acetate 100 Mg Tabs (Flecainide acetate) .[name]... Take one tablet two times a day Her updated medication list for this problem includes:     Coumadin 5 Mg Tabs (Warfarin sodium) ..... Use as directed    Aspirin Adult Low Strength 81 Mg Tbec (Aspirin) .[name]... 1 tab once daily    Pindolol 5 Mg Tabs (Pindolol) .[name]... Take 1/2 tablet two times a day  Orders: EKG w/ Interpretation (93000)  Problem # 2:  COUMADIN THERAPY (ICD-V58.61) Assessment: Unchanged  Problem # 3:  HYPERTENSION (ICD-401.9) Assessment: Improved  Her updated medication list for this problem includes:    Aspirin Adult Low Strength 81 Mg Tbec (Aspirin) .[name]... 1 tab once daily    Pindolol 5 Mg Tabs (Pindolol) .[name]... Take 1/2 tablet two times a day    Amlodipine Besylate 2.5 Mg Tabs (Amlodipine besylate) .[name]... Take one by mouth evey am  Problem # 4:  CEREBROVASCULAR DISEASE (ICD-437.9)  Problem # 5:  CONSTIPATION (ICD-564.00)  Patient Instructions: 1)  Your physician has recommended you make the following change in your medication: STOP Flecainide 2)  Your physician has recommended that you start taking Sotalol.  Initiation of this medication requires a 3-day hospital stay, during which the medication is administered and you will be monitored closely.  You should not stop this medication without physician assistance.  We will schedule admission to the hospital on Friday September 23,11.

## 2010-10-30 NOTE — Assessment & Plan Note (Signed)
Summary: B/P ranges from 172/73am-144/63pm/nm  Medications Added AMLODIPINE BESYLATE 5 MG  TABS (AMLODIPINE BESYLATE) One tablet by mouth daily * MAGIC MOUTHWASH 1 tsp gargle & swallow, as needed BETAPACE 120 MG TABS (SOTALOL HCL) two times a day, pt does not know the dose      Allergies Added:   Visit Type:  follow-up, Hosp Primary Provider:  Dr. Pete Glatter  CC:  high BP, pt stated is tired, and .  History of Present Illness: This is an 77 year old white female patient who was recently admitted to the hospital for atrial fibrillation and sotalol treatment. She had been on flecainide but could not tolerate higher dose because of constipation. In the hospital she did have some sinus bradycardia on higher doses of sotalol and converted to sinus rhythm. When her sotalol dose was decreased she reverted back to atrial fibrillation. It was decided to stay with the higher dose of sotalol and follow her bradycardia.  Since the patient's been home she complains of elevated blood pressures. She checks them frequently at home and they've been between 156/94 and 175/73. Her pulse ranges between 45 and 97. She says she feels tired when her blood pressure is high. She lives at [facility] and needs one meal a day they are but when she cooks for herself she usually eats a frozen chicken potpie or something similar.  Current Medications (verified): 1)  Coumadin 5 Mg  Tabs (Warfarin Sodium) .... Use As Directed 2)  Aspirin Adult Low Strength 81 Mg Tbec (Aspirin) .[name].. 1 Tab Once Daily 3)  Amlodipine Besylate 2.5 Mg Tabs (Amlodipine Besylate) .... Take One By Mouth Evey Am 4)  Pravastatin Sodium 80 Mg  Tabs (Pravastatin Sodium) .... Take 1 Tablet By Mouth Once A Day 5)  Zetia 10 Mg  Tabs (Ezetimibe) .... Take 1 Tablet By Mouth Once A Day 6)  Fish Oil 1000 Mg  Caps (Omega-3 Fatty Acids) .... Take 1 Cap Once Daily 7)  Cvs Folic Acid 400 Mcg  Tabs (Folic Acid) .... Take 1 Tablet By Mouth Once A Day 8)   Vitamin D 1000 Unit  Tabs (Cholecalciferol) .... Take 1 Tablet By Mouth Once A Day 9)  Magic Mouthwash .... 1 Tsp Gargle & Swallow, As Needed 10)  Betapace 120 Mg Tabs (Sotalol Hcl) .... Two Times A Day, Pt Does Not Know The Dose  Allergies (verified): 1)  ! Penicillin 2)  ! 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 Neck: No JVD, HJR, Bruit, or thyroid enlargement Lungs: No tachypnea, clear without wheezing, rales, or rhonchi Cardiovascular: RRR, PMI not displaced, heart sounds normal, no murmurs, gallops, bruit, thrill, or heave. Abdomen: BS normal. Soft without organomegaly, masses,  lesions or tenderness. Extremities: without cyanosis, clubbing or edema. Good distal pulses bilateral SKin: Warm, no lesions or rashes  Musculoskeletal: No deformities Neuro: no focal signs    EKG  Procedure date:  07/04/2010  Findings:      sinus bradycardia of 42 beats per minute nonspecific T-wave changes QT corrected 412 ms  Impression & Recommendations:  Problem # 1:  ATRIAL FIBRILLATION (ICD-427.31) Patient converted to normal sinus rhythm on sotalol. She remains bradycardic in the 40s. I  reviewed her EKG with Dr.Allred, who was to continue the current dose of sotalol. The following medications were removed from the medication list:    Pindolol 5 Mg Tabs (Pindolol) .[name]... Take 1/2 tablet two times a day Her updated medication list for this problem includes:    Coumadin 5 Mg Tabs (Warfarin sodium) ..... Use as directed    Aspirin Adult Low Strength 81 Mg Tbec (Aspirin) .[name]... 1 tab once daily    Betapace 120 Mg Tabs (Sotalol hcl) .[name]..[name] Two times a day, pt does not know the dose  Orders: EKG w/ Interpretation (93000)  Problem # 2:  HYPERTENSION (ICD-401.9) Patient's blood pressures been elevated since she's been out of the hospital. Will increase her amlodipine 5 mg daily. We have also him and her 2 g sodium diet to follow. The following medications were removed from the medication list:    Pindolol 5 Mg Tabs (Pindolol) .[name]... Take 1/2 tablet two times a day Her updated medication list for this problem includes:    Aspirin Adult Low Strength 81 Mg Tbec (Aspirin) .[name]... 1 tab once daily    Amlodipine Besylate 5 Mg Tabs (Amlodipine besylate) ..... One tablet by mouth daily    Betapace 120 Mg Tabs (Sotalol hcl) .[name]..[name] Two times a day, pt does not know the dose  Problem # 3:  COUMADIN THERAPY (ICD-V58.61) continue Coumadin  Patient Instructions: 1)  Your physician recommends that you schedule a follow-up appointment in: Pt. has an appointment with Dr. Daleen Squibb on 07/19/10. 2)  Your physician has requested that you limit the intake of sodium (salt) in your diet to two grams daily. Please see [facility] handout. 3)  Your physician has recommended you make the following change in your medication: Amlodipine 5 mg daily. Take 2/2.5 mg Amlodipine daily untill finish, then take one tablet of the 5 mg amlodipine. Prescriptions: AMLODIPINE BESYLATE 5 MG  TABS (AMLODIPINE BESYLATE) One tablet by mouth daily  #30 x 6   Entered by:   Ollen Gross, RN, BSN   Authorized by:   Marletta Lor,  PA-C   Signed by:   Ollen Gross, RN, BSN on 07/04/2010   Method used:   Electronically to        [facility] [address] (retail)       [phone]       [place], [place]  [address]       Ph: [phone]       Fax: [phone]   RxID:   [id number]

## 2010-10-30 NOTE — Medication Information (Signed)
Summary: ccr  Anticoagulant Therapy  Managed by: Weston Brass, PharmD Referring MD: Valera Castle MD PCP: Merry Proud MD: Riley Kill MD, Maisie Fus Indication 1: Atrial Fibrillation (ICD-427.31) Lab Used: [facility] Site: [facility] INR POC 2.3 INR RANGE 2 - 3  Dietary changes: no    Health status changes: no    Bleeding/hemorrhagic complications: no    Recent/future hospitalizations: no    Any changes in medication regimen? no    Recent/future dental: no  Any missed doses?: no       Is patient compliant with meds? yes       Allergies: 1)  ! Penicillin 2)  ! Codeine  Anticoagulation Management History:      The patient is taking warfarin and comes in today for a routine follow up visit.  Positive risk factors for bleeding include an age of 75 years or older.  The bleeding index is 'intermediate risk'.  Positive CHADS2 values include History of HTN and Age > 75 years old.  The start date was 05/07/2005.  Her last INR was 5.4.  Anticoagulation responsible provider: Riley Kill MD, Maisie Fus.  INR POC: 2.3.  Cuvette Lot#: [id number].  Exp: 06/2011.    Anticoagulation Management Assessment/Plan:      The patient's current anticoagulation dose is Coumadin 5 mg  tabs: use as directed.  The target INR is 2 - 3.  The next INR is due 06/15/2010.  Anticoagulation instructions were given to patient.  Results were reviewed/authorized by Weston Brass, PharmD.  She was notified by Gweneth Fritter, PharmD Candidate.         Prior Anticoagulation Instructions: INR 2.1  Continue same dose of 1 tab daily except for 1.5 tabs on Tuesday and Thursday.  Re-check in 4 weeks.  Current Anticoagulation Instructions: INR 2.3  Continue taking 1 tablet (5mg ) every day except take 1.5 tablets (7.5mg ) on Tuesdays and Thursdays.  Recheck in 4 weeks.

## 2010-10-30 NOTE — Medication Information (Signed)
Summary: rov/cb  Anticoagulant Therapy  Managed by: Bethena Midget, RN, BSN Referring MD: Valera Castle MD PCP: Merry Proud MD: Juanda Chance MD, Lucien Budney Indication 1: Atrial Fibrillation (ICD-427.31) Lab Used: [facility] Site: [facility] INR POC 3.1 INR RANGE 2 - 3  Dietary changes: no    Health status changes: no    Bleeding/hemorrhagic complications: no    Recent/future hospitalizations: no    Any changes in medication regimen? yes       Details: Now on GERD med, not sure of name, will call us with name.   Recent/future dental: no  Any missed doses?: no       Is patient compliant with meds? yes       Allergies: 1)  ! Penicillin 2)  ! Codeine  Anticoagulation Management History:      The patient is taking warfarin and comes in today for a routine follow up visit.  Positive risk factors for bleeding include an age of 39 years or older.  The bleeding index is 'intermediate risk'.  Positive CHADS2 values include History of HTN and Age > 99 years old.  The start date was 05/07/2005.  Her last INR was 5.4.  Anticoagulation responsible provider: Juanda Chance MD, Smitty Cords.  INR POC: 3.1.  Cuvette Lot#: [id number].  Exp: 03/2011.    Anticoagulation Management Assessment/Plan:      The patient's current anticoagulation dose is Coumadin 5 mg  tabs: use as directed.  The target INR is 2 - 3.  The next INR is due 02/16/2010.  Anticoagulation instructions were given to patient.  Results were reviewed/authorized by Bethena Midget, RN, BSN.  She was notified by Bethena Midget, RN, BSN.         Prior Anticoagulation Instructions: INR 1.7. Take 2 tablets today, then take 1 tablet daily except 1.5 tablets Tues, Thurs, Sat.  Current Anticoagulation Instructions: INR 3.1 Change dose to 1 pill everyday except 1 1/2 pill on Tuesdays and Saturdays. Recheck in 2-3 weeks.

## 2010-10-30 NOTE — Medication Information (Signed)
Summary: rov/sl   Anticoagulant Therapy  Managed by: Weston Brass, PharmD Referring MD: Valera Castle MD PCP: Dr. Pete Glatter Supervising MD: Eden Emms MD, Theron Arista Indication 1: Atrial Fibrillation (ICD-427.31) Lab Used: [facility] Site: [facility] INR POC 3.2 INR RANGE 2 - 3  Dietary changes: yes       Details: decreased green vegetables  Health status changes: no    Bleeding/hemorrhagic complications: no    Recent/future hospitalizations: no    Any changes in medication regimen? no    Recent/future dental: no  Any missed doses?: no       Is patient compliant with meds? yes       Allergies: 1)  ! Penicillin 2)  ! Codeine  Anticoagulation Management History:      The patient is taking warfarin and comes in today for a routine follow up visit.  Positive risk factors for bleeding include an age of 75 years or older.  The bleeding index is 'intermediate risk'.  Positive CHADS2 values include History of HTN and Age > 26 years old.  The start date was 05/07/2005.  Her last INR was 0.94.  Anticoagulation responsible provider: Eden Emms MD, Theron Arista.  INR POC: 3.2.  Cuvette Lot#: [id number].  Exp: 07/2011.    Anticoagulation Management Assessment/Plan:      The patient's current anticoagulation dose is Coumadin 5 mg  tabs: use as directed.  The target INR is 2 - 3.  The next INR is due 09/11/2010.  Anticoagulation instructions were given to patient.  Results were reviewed/authorized by Weston Brass, PharmD.  She was notified by Weston Brass PharmD.         Prior Anticoagulation Instructions: INR 1.0  Today, Tuesday, November 29th and tomorrow, November 30th take Coumadin 2 tabs (10 mg). Then take Coumadin 1 tab (5 mg) daily. Return to clinic in 1 week.   Current Anticoagulation Instructions: INR 3.2  Continue same dose of 1 tablet every day.  Recheck INR in 1 week.

## 2010-10-30 NOTE — Progress Notes (Signed)
Summary: medication questions   Phone Note Call from Patient Call back at Home Phone [phone]   Caller: Patient Summary of Call: Pt calling regarding medication Initial call taken by: Judie Grieve,  July 11, 2010 10:53 AM  Follow-up for Phone Call        Pt understands to take amlodipine 5 mg daily as well as betapace 120mg  two times a day.  Her bp today was 121/60.  Yesterday bp was 159/72, 142/63, & 148/62. Mylo Red RN

## 2010-10-30 NOTE — Letter (Signed)
Summary: Cardioversion/TEE Instructions  Architectural technologist, Main Office  [address]   [place], [place] [address]   Phone: [phone]  Fax: [phone]     TEE Cardioversion Instructions  09/07/2010 MRN: [id number]  [facility] [address] [place], [place]  [address]  Dear Anne Hayes, You are scheduled for a  TEE Cardioversion on 09/10/10 with Dr. Jens Som.   Please arrive at the [facility] at 11:30 a.m.  on the day of your procedure.  1)   DIET:  A)   Nothing to eat or drink after midnight except your medications with a sip of water.    2)   Come to the [place] office on 09/07/10 for lab work.You do not have to be fasting.  3)   MAKE SURE YOU TAKE YOUR COUMADIN.  4)   B)   YOU MAY TAKE ALL of your remaining medications with a small amount of water.   5)  Must have a responsible person to drive you home.  6)   Bring a current list of your medications and current insurance cards.   * Special Note:  Every effort is made to have your procedure done on time. Occasionally there are emergencies that present themselves at the hospital that may cause delays. Please be patient if a delay does occur.  * If you have any questions after you get home, please call the office at [phone]. Anselm Pancoast

## 2010-10-30 NOTE — Progress Notes (Signed)
Summary: returned call [facility] x1   Phone Note Call from Patient Call back at [facility] Phone [phone]   Caller: Patient Call For: Velmer Woelfel Summary of Call: Returning Leigh's call. Initial call taken by: Darletta Moll,  March 09, 2010 3:44 PM  Follow-up for Phone Call        [facility] Yetta Barre RN  March 09, 2010 4:15 PM---   called and spoke with pt ---she is aware of lab results---per SN--she will need better low fat diet---triglycerides were still elevated at 206---ldl at 72.  pt voiced her understanding of lab results and will cont with diet alone. Randell Loop CMA  March 12, 2010 1:01 PM

## 2010-10-30 NOTE — Progress Notes (Signed)
Summary: set up labs  Phone Note Call from Patient Call back at Home Phone [phone]   Caller: Patient Call For: nadel Summary of Call: pt had to cancel for today w/ sn. however, she does want her labs set up to check her cholesterol.  Initial call taken by: Tivis Ringer, CNA,  Feb 14, 2010 12:44 PM  Follow-up for Phone Call        Called and spoke with pt.  She states that she had to cancel her appt for today because she is "overwhlemed" by all of her other appointments.  She is unsure when she will be able to resched at this point but is concerned about her triglycerides and would like to have labs rechecked before she goes back to see cards in 1 month.  Pt was last seen here by SN on 11/15/09.  Please advise thanks! Follow-up by: Vernie Murders,  Feb 14, 2010 1:26 PM  Additional Follow-up for Phone Call Additional follow up Details #1::        per SN---ok to have labs---lip-272.0/bmp-401.9/hepat-790.5---lmomtcb for pt to see when she wanted to come in for this. Randell Loop CMA  Feb 14, 2010 3:07 PM     Additional Follow-up for Phone Call Additional follow up Details #2::    called and spoke with pt and she will come in monday for her labs. Randell Loop CMA  Feb 16, 2010 9:20 AM

## 2010-10-30 NOTE — Medication Information (Signed)
Summary: rov/ewj  Anticoagulant Therapy  Managed by: Cloyde Reams, RN, BSN Referring MD: Valera Castle MD PCP: Merry Proud MD: Myrtis Ser MD, Tinnie Gens Indication 1: Atrial Fibrillation (ICD-427.31) Lab Used: [facility] Site: [facility] INR POC 2.0 INR RANGE 2 - 3  Dietary changes: yes       Details: Incr in salads, vit K.    Health status changes: no    Bleeding/hemorrhagic complications: no    Recent/future hospitalizations: no    Any changes in medication regimen? no    Recent/future dental: no  Any missed doses?: no       Is patient compliant with meds? yes       Allergies (verified): 1)  ! Penicillin 2)  ! Codeine  Anticoagulation Management History:      The patient is taking warfarin and comes in today for a routine follow up visit.  Positive risk factors for bleeding include an age of 75 years or older.  The bleeding index is 'intermediate risk'.  Positive CHADS2 values include History of HTN and Age > 29 years old.  The start date was 05/07/2005.  Her last INR was 5.4.  Anticoagulation responsible provider: Myrtis Ser MD, Tinnie Gens.  INR POC: 2.0.  Cuvette Lot#: [id number].  Exp: 01/2011.    Anticoagulation Management Assessment/Plan:      The patient's current anticoagulation dose is Coumadin 5 mg  tabs: use as directed.  The target INR is 2 - 3.  The next INR is due 01/02/2010.  Anticoagulation instructions were given to patient.  Results were reviewed/authorized by Cloyde Reams, RN, BSN.  She was notified by Cloyde Reams RN.         Prior Anticoagulation Instructions: INR 2.4  Continue on same dosage 5mg  daily except 7.5mg  on Tuesdays and Thursdays.  Recheck in 4 weeks.    Current Anticoagulation Instructions: INR 2.0  Take 2 tablets tonight, then resume same dosage 1 tablet daily except 1.5 tablets on Tuesdays and Thursdays.  Recheck in 4 weeks.

## 2010-10-30 NOTE — Progress Notes (Signed)
Summary: low bp   Phone Note Call from Patient   Caller: Patient [phone] Reason for Call: Talk to Nurse Summary of Call: pt calling re low bp Initial call taken by: Glynda Jaeger,  June 28, 2010 1:59 PM  Follow-up for Phone Call        Spoke with pt. Pt. states took her B/P this am with her b/p machine and  at the houseing office. B/P was 98/58 she states wanted to verify if she was in a-fib, but she said she was in SR. pt. denies any symptoms . Pt. was d/c from the hospital yesterday. She states her b/p was 98, 96 systalic in the hospital. Pt is taken Amlodipine 2.5 mg daily. I let pt. know to call the office if she has any symptoms. Pt. verbalized understanding.   Follow-up by: Ollen Gross, RN, BSN,  June 28, 2010 3:25 PM  Additional Follow-up for Phone Call Additional follow up Details #1::        Lets stop amlodopine. Additional Follow-up by: Gaylord Shih, MD, [facility],  June 28, 2010 4:41 PM     Appended Document: low bp [facility] Mylo Red RN  Appended Document: low bp Pt will stop Amlodipine. Mylo Red RN  Appended Document: low bp Pt's daughter has rechecked blood pressure x 2 today.  Pressures were 150/64 p 62 and 148/68.  Pt has taken her amlodipine today.  Will talk with DOD and call patient back. Mylo Red RN  Appended Document: low bp Pt aware DOD recommended continuing amlodipine as bp was 150/64 & 148/68. Mylo Red RN

## 2010-10-30 NOTE — Medication Information (Signed)
Summary: rov/eac  Anticoagulant Therapy  Managed by: Cloyde Reams, RN, BSN Referring MD: Valera Castle MD PCP: Merry Proud MD: Riley Kill MD, Maisie Fus Indication 1: Atrial Fibrillation (ICD-427.31) Lab Used: [facility] Site: [facility] INR POC 2.4 INR RANGE 2 - 3  Dietary changes: no    Health status changes: no    Bleeding/hemorrhagic complications: no    Recent/future hospitalizations: no    Any changes in medication regimen? no    Recent/future dental: no  Any missed doses?: no       Is patient compliant with meds? yes      Comments: Pt states she has been taking 5mg  daily except 7.5mg  on Tuesdays and Thursdays.    Allergies (verified): 1)  ! Penicillin 2)  ! Codeine  Anticoagulation Management History:      The patient is taking warfarin and comes in today for a routine follow up visit.  Positive risk factors for bleeding include an age of 75 years or older.  The bleeding index is 'intermediate risk'.  Positive CHADS2 values include History of HTN and Age > 56 years old.  The start date was 05/07/2005.  Her last INR was 5.4.  Anticoagulation responsible provider: Riley Kill MD, Maisie Fus.  INR POC: 2.4.  Cuvette Lot#: [id number].  Exp: 12/2010.    Anticoagulation Management Assessment/Plan:      The patient's current anticoagulation dose is Coumadin 5 mg  tabs: use as directed.  The target INR is 2 - 3.  The next INR is due 12/05/2009.  Anticoagulation instructions were given to patient.  Results were reviewed/authorized by Cloyde Reams, RN, BSN.  She was notified by Cloyde Reams RN.         Prior Anticoagulation Instructions: INR 2.1 Change dose to 5mg s everyday except 7.5mg s on Tuesdays, Thursdays and Saturdays. Recheck in 3 weeks.   Current Anticoagulation Instructions: INR 2.4  Continue on same dosage 5mg  daily except 7.5mg  on Tuesdays and Thursdays.  Recheck in 4 weeks.

## 2010-10-30 NOTE — Medication Information (Signed)
Summary: rov/sp      Allergies Added:  Anticoagulant Therapy  Managed by: Reina Fuse, PharmD Referring MD: Valera Castle MD PCP: Dr. Pete Glatter Supervising MD: Eden Emms MD, Theron Arista Indication 1: Atrial Fibrillation (ICD-427.31) Lab Used: [facility] Site: [facility] INR POC 2.5 INR RANGE 2 - 3  Dietary changes: yes       Details: Has been eating less leafy greens in the past week.   Health status changes: no    Bleeding/hemorrhagic complications: no    Recent/future hospitalizations: yes       Details: D/C'd from hospital the Friday before last. Has been on regular Coumadin schedule.   Any changes in medication regimen? no    Recent/future dental: no  Any missed doses?: no       Is patient compliant with meds? yes       Current Medications (verified): 1)  Coumadin 5 Mg  Tabs (Warfarin Sodium) .... Use As Directed 2)  Aspirin Adult Low Strength 81 Mg Tbec (Aspirin) .[name].. 1 Tab Once Daily 3)  Amlodipine Besylate 5 Mg  Tabs (Amlodipine Besylate) .... One Tablet By Mouth Daily 4)  Pravastatin Sodium 80 Mg  Tabs (Pravastatin Sodium) .... Take 1 Tablet By Mouth Once A Day 5)  Zetia 10 Mg  Tabs (Ezetimibe) .... Take 1 Tablet By Mouth Once A Day 6)  Fish Oil 1000 Mg  Caps (Omega-3 Fatty Acids) .... Take 1 Cap Once Daily 7)  Cvs Folic Acid 400 Mcg  Tabs (Folic Acid) .... Take 1 Tablet By Mouth Once A Day 8)  Vitamin D 1000 Unit  Tabs (Cholecalciferol) .... Take 1 Tablet By Mouth Once A Day 9)  Magic Mouthwash .... 1 Tsp Gargle & Swallow, As Needed 10)  Sotalol Hcl 120 Mg Tabs (Sotalol Hcl) .[name].. 1 Tab Two Times A Day 11)  Coq10 30 Mg Caps (Coenzyme Q10) .[name].. 1 Cap Once Daily 12)  Folic Acid 1 Mg Tabs (Folic Acid) .[name].. 1 Tab Once Daily 13)  Calcium-Vitamin D 500-200 Mg-Unit Tabs (Calcium Carbonate-Vitamin D) .[name].. 1 Tab Once Daily 14)  Omeprazole 40 Mg Cpdr (Omeprazole) .[name].. 1 Tab Once Daily  Allergies (verified): 1)  ! Penicillin 2)  ! Codeine  Anticoagulation Management  History:      The patient is taking warfarin and comes in today for a routine follow up visit.  Positive risk factors for bleeding include an age of 9 years or older.  The bleeding index is 'intermediate risk'.  Positive CHADS2 values include History of HTN and Age > 38 years old.  The start date was 05/07/2005.  Her last INR was 5.4.  Anticoagulation responsible provider: Eden Emms MD, Theron Arista.  INR POC: 2.5.  Cuvette Lot#: [id number].  Exp: 08/2011.    Anticoagulation Management Assessment/Plan:      The patient's current anticoagulation dose is Coumadin 5 mg  tabs: use as directed.  The target INR is 2 - 3.  The next INR is due 08/16/2010.  Anticoagulation instructions were given to patient.  Results were reviewed/authorized by Reina Fuse, PharmD.  She was notified by Reina Fuse PharmD.         Prior Anticoagulation Instructions: INR 2.6  Continue on same dosage 5mg  daily except 7.5mg  on Tuesdays and Thursdays.  Recheck in 4 weeks.    Current Anticoagulation Instructions: INR 2.5  Continue taking Coumadin 5 mg (1 tab) on all days except Coumadin 7.5 mg (1.5 tabs) on Tuesdays and Thursdays. Return to clinic in 4 weeks.

## 2010-10-30 NOTE — Progress Notes (Signed)
Summary: pt not any better/req call until 2p   Phone Note Call from Patient   Caller: Patient [phone] until 2p Reason for Call: Talk to Nurse Summary of Call: pt not any better, wants a nurse call pls Initial call taken by: Glynda Jaeger,  August 27, 2010 12:00 PM  Follow-up for Phone Call        Pt to come in today at 4pm Dennis Bast, RN, BSN  August 27, 2010 12:44 PM

## 2010-10-30 NOTE — Progress Notes (Signed)
  Phone Note Other Incoming   Request: Send information Summary of Call: [place] medical release completed by the patient. Request forwarded to [facility] for copies of all records.

## 2010-10-30 NOTE — Consult Note (Signed)
Summary: [facility]  [facility]   Imported By: Lester [place] 12/08/2009 09:03:17  _____________________________________________________________________  External Attachment:    Type:   Image     Comment:   External Document

## 2010-10-30 NOTE — Consult Note (Signed)
Summary: [facility]   [facility]   Imported By: Roderic Ovens 07/17/2010 15:10:46  _____________________________________________________________________  External Attachment:    Type:   Image     Comment:   External Document

## 2010-10-30 NOTE — Medication Information (Signed)
Summary: rov/tm  Anticoagulant Therapy  Managed by: Bethena Midget, RN, BSN Referring MD: Valera Castle MD PCP: Merry Proud MD: Myrtis Ser MD, Tinnie Gens Indication 1: Atrial Fibrillation (ICD-427.31) Lab Used: [facility] Site: [facility] INR POC 2.1 INR RANGE 2 - 3  Dietary changes: no    Health status changes: no    Bleeding/hemorrhagic complications: no    Recent/future hospitalizations: no    Any changes in medication regimen? no    Recent/future dental: no  Any missed doses?: no       Is patient compliant with meds? yes       Allergies: 1)  ! Penicillin 2)  ! Codeine  Anticoagulation Management History:      The patient is taking warfarin and comes in today for a routine follow up visit.  Positive risk factors for bleeding include an age of 75 years or older.  The bleeding index is 'intermediate risk'.  Positive CHADS2 values include History of HTN and Age > 75 years old.  The start date was 05/07/2005.  Her last INR was 5.4.  Anticoagulation responsible provider: Myrtis Ser MD, Tinnie Gens.  INR POC: 2.1.  Cuvette Lot#: [id number].  Exp: 12/2010.    Anticoagulation Management Assessment/Plan:      The patient's current anticoagulation dose is Coumadin 5 mg  tabs: use as directed.  The target INR is 2 - 3.  The next INR is due 11/07/2009.  Anticoagulation instructions were given to patient.  Results were reviewed/authorized by Bethena Midget, RN, BSN.  She was notified by Bethena Midget, RN, BSN.         Prior Anticoagulation Instructions: INR 2.1 Continue 5mg s daily except 7.5mg s on Tuesdays and Thursdays. Recheck in 2 weeks.   Current Anticoagulation Instructions: INR 2.1 Change dose to 5mg s everyday except 7.5mg s on Tuesdays, Thursdays and Saturdays. Recheck in 3 weeks.

## 2010-10-30 NOTE — Medication Information (Signed)
Summary: ccr  Medications Added AMIODARONE HCL 200 MG TABS (AMIODARONE HCL) three times a day x 1 week, then twice daily       Anticoagulant Therapy  Managed by: Reina Fuse, PharmD Referring MD: Valera Castle MD PCP: Dr. Pete Glatter Supervising MD: Myrtis Ser MD, Tinnie Gens Indication 1: Atrial Fibrillation (ICD-427.31) Lab Used: [facility] Site: [facility] INR POC 3.7 INR RANGE 2 - 3  Dietary changes: no    Health status changes: no    Bleeding/hemorrhagic complications: no    Recent/future hospitalizations: yes       Details: D/C'd from hospital 11/9 for Afib  Any changes in medication regimen? yes       Details: new start to amiodarone in hospital  Recent/future dental: no  Any missed doses?: no       Is patient compliant with meds? yes      Comments: Started on amiodarone 200 mg three times a day x 1 week, then 200 mg two times a day. INR elevated today. Will empirically decrease dose today by ~13%. Pt may need further dose reduction at next visit.   Allergies: 1)  ! Penicillin 2)  ! Codeine  Anticoagulation Management History:      The patient is taking warfarin and comes in today for a routine follow up visit.  Positive risk factors for bleeding include an age of 75 years or older.  The bleeding index is 'intermediate risk'.  Positive CHADS2 values include History of HTN and Age > 75 years old.  The start date was 05/07/2005.  Her last INR was 5.4.  Anticoagulation responsible provider: Myrtis Ser MD, Tinnie Gens.  INR POC: 3.7.  Cuvette Lot#: [id number].  Exp: 08/2011.    Anticoagulation Management Assessment/Plan:      The patient's current anticoagulation dose is Coumadin 5 mg  tabs: use as directed.  The target INR is 2 - 3.  The next INR is due 08/21/2010.  Anticoagulation instructions were given to patient.  Results were reviewed/authorized by Reina Fuse, PharmD.  She was notified by Reina Fuse PharmD.         Prior Anticoagulation Instructions: INR 2.5  Continue taking Coumadin  5 mg (1 tab) on all days except Coumadin 7.5 mg (1.5 tabs) on Tuesdays and Thursdays. Return to clinic in 4 weeks.   Current Anticoagulation Instructions: INR 3.7  Do not take Coumadin today, Friday, November 11th. Then, take Coumadin 1 tab (5 mg) every day. Return to clinic in 10 days.

## 2010-10-30 NOTE — Medication Information (Signed)
Summary: ccr  Anticoagulant Therapy  Managed by: Elaina Pattee, PharmD Referring MD: Valera Castle MD PCP: Merry Proud MD: Tenny Craw MD, Gunnar Fusi Indication 1: Atrial Fibrillation (ICD-427.31) Lab Used: [facility] Site: [facility] INR POC 1.0 INR RANGE 2 - 3  Dietary changes: no    Health status changes: no    Bleeding/hemorrhagic complications: no    Recent/future hospitalizations: yes       Details: Pt will be having eyelid tuck on 03/28/10. Pt stopped Coumadin Monday night per Dr. Daleen Squibb.  Any changes in medication regimen? no    Recent/future dental: no  Any missed doses?: no       Is patient compliant with meds? yes       Allergies: 1)  ! Penicillin 2)  ! Codeine  Anticoagulation Management History:      The patient is taking warfarin and comes in today for a routine follow up visit.  Positive risk factors for bleeding include an age of 75 years or older.  The bleeding index is 'intermediate risk'.  Positive CHADS2 values include History of HTN and Age > 50 years old.  The start date was 05/07/2005.  Her last INR was 5.4.  Anticoagulation responsible provider: Tenny Craw MD, Gunnar Fusi.  INR POC: 1.0.  Cuvette Lot#: [id number].  Exp: 8/12.    Anticoagulation Management Assessment/Plan:      The patient's current anticoagulation dose is Coumadin 5 mg  tabs: use as directed.  The target INR is 2 - 3.  The next INR is due 04/04/2010.  Anticoagulation instructions were given to patient.  Results were reviewed/authorized by Elaina Pattee, PharmD.  She was notified by Elaina Pattee, PharmD.         Prior Anticoagulation Instructions: INR 2.2  Continue taking 1.5 tablets on Tuesday and Saturday and 1 tablet all other days.  Return to clinic in 4 weeks.  Patient pending procedure, please inquire at next visit.  Current Anticoagulation Instructions: INR 1.0. Start Coumadin when MD tells you to after the procedure.  Take Coumadin 1.5 tablets for 2 days, then take 1 tablet daily except 1.5  tablets on Tues and Sat. Recheck on 04/04/10.

## 2010-10-30 NOTE — Assessment & Plan Note (Signed)
Summary: 6 MMO F/U /CY    Visit Type:  6 mo f/u Primary Provider:  Kriste Basque  CC:  sob at times...denies any cp or edema.  History of Present Illness: Anne Hayes returns today for further management of her paroxysmal atrial fibrillation, anticoagulation, hypertension, and hyperlipidemia.  She has no complaints today. In fact she feels really good. She denies any bleeding or melena.  Her last lipids were goal except for a total triglyceride count of 251 with a VLDL of 50.2.  Current Medications (verified): 1)  Coumadin 5 Mg  Tabs (Warfarin Sodium) .... Use As Directed 2)  Aspirin Adult Low Strength 81 Mg Tbec (Aspirin) .... Take 2 Tablets By Mouth Once Daily 3)  Flecainide Acetate 100 Mg  Tabs (Flecainide Acetate) .... Take One Tablet Two Times A Day 4)  Pindolol 5 Mg  Tabs (Pindolol) .... Take 1/2 Tablet Two Times A Day 5)  Amlodipine Besylate 2.5 Mg Tabs (Amlodipine Besylate) .... Take One By Mouth Evey Am 6)  Pravastatin Sodium 80 Mg  Tabs (Pravastatin Sodium) .... Take 1 Tablet By Mouth Once A Day 7)  Zetia 10 Mg  Tabs (Ezetimibe) .... Take 1 Tablet By Mouth Once A Day 8)  Fish Oil 1000 Mg  Caps (Omega-3 Fatty Acids) .... Take 1 Cap Once Daily 9)  Coq10 30 Mg Caps (Coenzyme Q10) .... Take 1 Tablet By Mouth Once A Day 10)  Cvs Folic Acid 400 Mcg  Tabs (Folic Acid) .... Take 1 Tablet By Mouth Once A Day 11)  One-A-Day Extras Antioxidant   Caps (Multiple Vitamins-Minerals) .... Take 1 Capsule By Mouth Once A Day 12)  Vitamin D 1000 Unit  Tabs (Cholecalciferol) .... Take 1 Tablet By Mouth Once A Day 13)  Magic Mouthwash .... 1 Tsp Gargle & Swallow 4 Times Daily As Directed...  Allergies: 1)  ! Penicillin 2)  ! 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  Alcohol Use - yes Retired   Risk Factors: Smoking Status: quit (12/20/2008)  Review of Systems       negative other than history of present illness  Vital Signs:  Patient profile:   77 year old female Height:      66.5 inches Weight:      156 pounds BMI:     24.89 Pulse rate:   50 / minute Pulse rhythm:   irregular BP sitting:   136 / 70  (left arm) Cuff size:   large  Vitals Entered By: Danielle Rankin, CMA (November 28, 2009 3:18 PM)  Physical Exam  General:  younger than stated a Head:  normocephalic and atraumatic Eyes:  PERRLA/EOM intact; conjunctiva and lids normal. Neck:  Neck supple, no JVD. No masses, thyromegaly or abnormal cervical nodes. Chest Praneeth Bussey:  no deformities or breast masses noted Lungs:  Clear bilaterally to auscultation and percussion. Heart:  regular rate and rhythm, normal S1-S2 Msk:  Back normal, normal gait. Muscle strength and tone normal. Pulses:  pulses normal in all 4 extremities Extremities:  No clubbing or cyanosis. Neurologic:  Alert and oriented x 3. Skin:  Intact without lesions or rashes. Psych:  Normal affect.   EKG  Procedure date:  11/28/2009  Findings:      sinus bradycardia with first-degree AV block, no acute changes  Impression & Recommendations:  Problem # 1:  PAROXYSMAL ATRIAL FIBRILLATION (ICD-427.31) Assessment Improved  Her updated medication list for this problem includes:    Coumadin 5 Mg Tabs (Warfarin sodium)  ..... Use as directed    Aspirin Adult Low Strength 81 Mg Tbec (Aspirin) .[name]... Take 2 tablets by mouth once daily    Flecainide Acetate 100 Mg Tabs (Flecainide acetate) .[name]... Take one tablet two times a day    Pindolol 5 Mg Tabs (Pindolol) .[name]... Take 1/2 tablet two times a day  Orders: EKG w/ Interpretation (93000)  Problem # 2:  COUMADIN THERAPY (ICD-V58.61) Assessment: Unchanged  Problem # 3:  HYPERTENSION (ICD-401.9) Assessment: Improved  Her updated medication list for this problem includes:    Aspirin Adult Low Strength 81 Mg Tbec (Aspirin) .[name]... Take 2 tablets by mouth once daily    Pindolol 5 Mg Tabs (Pindolol) .[name]... Take 1/2 tablet two times a day    Amlodipine Besylate 2.5 Mg Tabs (Amlodipine besylate) .[name]... Take one by mouth evey am  Problem # 4:  HYPERCHOLESTEROLEMIA (ICD-272.0) Assessment: Improved  Her updated medication list for this problem includes:    Pravastatin Sodium 80 Mg Tabs (Pravastatin sodium) .[name]... Take 1 tablet by mouth once a day    Zetia 10 Mg Tabs (Ezetimibe) .[name]... Take 1 tablet by mouth once a day  Patient Instructions: 1)  Your physician recommends that you schedule a follow-up appointment in: 6 MONTHS WITH DR Paxton Kanaan DUE SEPT 2011 2)  Your physician recommends that you continue on your current medications as directed. Please refer to the Current Medication list given to you today.

## 2010-10-30 NOTE — Progress Notes (Signed)
Summary: refill request   Phone Note Refill Request Message from:  Patient on May 23, 2010 9:50 AM  Refills Requested: Medication #1:  FLECAINIDE ACETATE 100 MG  TABS take one tablet two times a day [facility]   Method Requested: Telephone to Pharmacy Initial call taken by: Glynda Jaeger,  May 23, 2010 9:51 AM  Follow-up for Phone Call        CMA s/w pt to advised rx already called in earlier this morning. pt gave verbal understanding... Danielle Rankin, CMA  May 23, 2010 10:59 AM

## 2010-10-30 NOTE — Medication Information (Signed)
Summary: rov/ewj  Anticoagulant Therapy  Managed by: Weston Brass, PharmD Referring MD: Valera Castle MD PCP: Merry Proud MD: Shirlee Latch MD, Freida Busman Indication 1: Atrial Fibrillation (ICD-427.31) Lab Used: [facility] Site: [facility] INR POC 2.1 INR RANGE 2 - 3  Dietary changes: no    Health status changes: no    Bleeding/hemorrhagic complications: no    Recent/future hospitalizations: no    Any changes in medication regimen? no    Recent/future dental: no  Any missed doses?: no       Is patient compliant with meds? yes       Allergies: 1)  ! Penicillin 2)  ! Codeine  Anticoagulation Management Assessment/Plan:      The patient's current anticoagulation dose is Coumadin 5 mg  tabs: use as directed.  The target INR is 2 - 3.  The next INR is due 05/16/2010.  Anticoagulation instructions were given to patient.  Results were reviewed/authorized by Weston Brass, PharmD.  She was notified by Dillard Cannon.         Prior Anticoagulation Instructions: INR 1.6  Take 1.5 tablets today and tomorrow, then resume same dosage 1 tablet daily except 1.5 tablets on Tuesdays and Thursdays.  Recheck in 2 weeks.    Current Anticoagulation Instructions: INR 2.1  Continue same dose of 1 tab daily except for 1.5 tabs on Tuesday and Thursday.  Re-check in 4 weeks.  Anticoagulation Management History:      The patient is taking warfarin and comes in today for a routine follow up visit.  Positive risk factors for bleeding include an age of 75 years or older.  The bleeding index is 'intermediate risk'.  Positive CHADS2 values include History of HTN and Age > 64 years old.  The start date was 05/07/2005.  Her last INR was 5.4.  Anticoagulation responsible provider: Shirlee Latch MD, [facility]  INR POC: 2.1.  Cuvette Lot#: [id number].  Exp: 06/2011.

## 2010-10-30 NOTE — Assessment & Plan Note (Signed)
Summary: rov/apc   Primary Care Provider:  Kriste Basque  CC:  14 month ROV & review of mult medical problems....  History of Present Illness: 77 y/o WF here for a follow up visit... she has multiple medical problems as noted below...    ~  November 15, 2009:  she had f/u right renal lesion by Urology w/ slow growth evident & Bx 4/10 showed Renal Cell Ca- clear cell type... decision was made betw DrBorden & [name] to try radiofreq ablation & this was done on 01/06/09 & 08/18/09... f/u CT 12/10 showed large ablation defect in right kidney- no residual enhancement (?sm nodule RLL to be followed as well)... she remains on Flecanide Rx from [name]- stable... her CC is intermittent hoarseness & "can't clear my throat"... her exam is neg & we discussed Rx w/ Mucinex, MMW, & ENT eval to get a look at her cords...   Current Problems:   Hx of COUGH (ICD-786.2) - he intermittent and recurrent croupy cough & throat symptoms- ?reflux related... encouraged to take PPI daily, & antireflux regimen discussed...  ~  baseline CXR showed incr markings c/w mild fibrosis...  ~  CXR 2/11 showed cardiomeg, & diffuse chr interstitial opac, osteopenia, NAD...  HYPERTENSION (ICD-401.9) - on PINDOLOL 5mg - 1/2Bid & AMLODIPINE 2.5mg /d... BP= 114/64, takes meds regularly, & tolerates well... denies HA, visual changes, CP, palipit, dizziness, syncope, dyspnea, edema, etc...   PAROXYSMAL ATRIAL FIBRILLATION (ICD-427.31) - long hx of tachy palpit... on FLECAINIDE 100mg Bid & COUMADIN followed [facility] the [facility]... she is followed by [name] & last seen 11/09- Amlodipine 2.5mg  added for her BP.  ~  [facility] 8/06 w/ new AF & rvr... converted spont to NSR...   ~  2DEcho 2/07 showed no LV regional wall motion abn, mild AoV calcif, mild MR, mild LA dil...  ~  NuclearStressTest 12/07 showed no scar or ischemia, EF= 72%... no change from 2000 or 2004.  CEREBROVASCULAR DISEASE (ICD-437.9) - she had a neuro eval 2008 by DrWillis w/ hx of  diplopia...  ~  MRI showed some atrophy & small vessel disease...  HYPERCHOLESTEROLEMIA (ICD-272.0) - on PRAVASTATIN 80mg /d,  ZETIA 10mg /d,  FISH OIL 1000mg Bid... prev followed in the [facility]id Clinic but she never followed up...  ~  last FLP 9/08 showed TChol 191, TG 337, HDL 42, LDL 90... may need fibrate added.  ~  FLP 12/09 showed TChol 143, TG 85, HDL 44, LDL 82  ~  FLP 2/11 showed TChol 138, TG 251, HDL 46, LDL 64  GERD (ICD-530.81) - last EGD was 1998 by [facility] showing small gastric ulcer, neg HPylori, Rx PPI.   CONSTIPATION (ICD-564.00) - last colonoscopy 11/07 by DrDBrodie was WNL- no divertics, polyps, etc... constip Rx w/ Miralax/ Senakot OTC... but she claims severe reaction to Miralax(?)...  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 she has urge > stress incont w/ unacceptable side effects from Detrol Ditropan, Enablex, Veiscare... she had prev bladder tac from DrMcPhail, & Raz Urethropexy from DrTannenbaum... she has a left renal cyst- 4-56mm size, no changes from 2000 & followed by sonar... **incidental finding of an 18mm enhancing lesion in right kidney, mid to lower pole- seen on CT Abd 9/08 (concern for small renal cell ca)... plan was for f/u sonar Q21mo to determine the growth rate...  ~  2/11:  ** SEE ABOVE **  DEGENERATIVE JOINT DISEASE (ICD-715.90) - hx DJD and LBP treated by  [name] and DrKritzer in the past...  ANXIETY DISORDER, GENERALIZED (ICD-300.02)   Allergies: 1)  ! Penicillin 2)  ! Codeine  Comments:  Nurse/Medical Assistant: The patient's medications and allergies were reviewed with the patient and were updated in the Medication and Allergy Lists.  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 non contributory  Social History: Reviewed history from 06/16/2009 and no changes required. Widowed  Tobacco Use - Former.  Alcohol Use - yes Retired   Review of Systems      See HPI       The patient complains of decreased hearing and dyspnea on exertion.  The patient denies anorexia, fever, weight loss, weight gain, vision loss, hoarseness, chest pain, syncope, peripheral edema, prolonged cough, headaches, hemoptysis, abdominal pain, melena, hematochezia, severe indigestion/heartburn, hematuria, incontinence, muscle weakness, suspicious skin lesions, transient blindness, difficulty walking, depression, unusual weight change, abnormal bleeding, enlarged lymph nodes, and angioedema.    Vital Signs:  Patient profile:   77 year old female Height:      66.5 inches Weight:      163.25 pounds BMI:     26.05 O2 Sat:      98 % on Room air Temp:     97.1 degrees F oral Pulse rate:   55 / minute BP sitting:   114 / 64  (right arm) Cuff size:   regular  Vitals Entered By: Randell Loop CMA (November 15, 2009 12:11 PM)  O2 Sat at Rest %:  98 O2 Flow:  Room air CC: 14 month ROV & review of mult medical problems... Is Patient Diabetic? No Pain Assessment Patient in pain? no      Comments meds updated today   Physical Exam  Additional Exam:  WD, WN, 77 y/o WF in NAD... GENERAL:  Alert & oriented; pleasant & cooperative... HEENT:  [place]/AT, EOM-wnl, PERRLA, EACs-clear, TMs-wnl, NOSE-clear, THROAT-clear & wnl, no lesions  seen. NECK:  Supple w/ fairROM; no JVD; normal carotid impulses w/o bruits; no thyromegaly or nodules palpated; no lymphadenopathy. CHEST:  Clear to P & A; without wheezes/ rales/ or rhonchi. HEART:  Regular Rhythm; without murmurs/ rubs/ or gallops. ABDOMEN:  Soft & nontender; normal bowel sounds; no organomegaly or masses detected. EXT: without deformities, mod arthritic changes; no varicose veins/ +venous insuffic/ no edema. NEURO:  CN's intact; motor testing normal; sensory testing normal; gait normal & balance OK. DERM:  No lesions noted; no rash etc...    CXR  Procedure date:  11/15/2009  Findings:      CHEST - 2 VIEW Comparison: 11/16/2008.   Findings: The cardiopericardial silhouette is enlarged. Pulmonary nodules seen in the left midlung on the previous study is not evident on today's film.  There is diffuse underlying chronic interstitial opacity with chronic features.  This appears slightly progressed in the lung bases.  No pulmonary edema or focal pneumonia. Bones are diffusely demineralized.   IMPRESSION: Cardiomegaly with underlying chronic interstitial changes, slightly progressed in the lung bases.   Read By:  Kennith Center,  M.D.   MISC. Report  Procedure  date:  11/17/2009  Findings:      Lipid Panel (LIPID)   Cholesterol               138 mg/dL                   1-610   Triglycerides        [H]  251.0 mg/dL                 9.6-045.4   HDL                       09.81 mg/dL                 >19.14   Cholesterol LDL - Direct                             64.4 mg/dL  BMP (METABOL)   Sodium                    142 mEq/L                   135-145   Potassium                 4.4 mEq/L                   3.5-5.1   Chloride                  107 mEq/L                   96-112   Carbon Dioxide            31 mEq/L                    19-32   Glucose              [H]  106 mg/dL                   78-29   BUN                       19 mg/dL                    5-62    Creatinine                0.9 mg/dL                   1.3-0.8   Calcium                   9.3 mg/dL                   6.5-78.4   GFR                       63.53 mL/min                >60  Hepatic/Liver Function Panel (HEPATIC)   Total Bilirubin           0.4 mg/dL                   6.9-6.2   Direct Bilirubin          0.1 mg/dL  0.0-0.3   Alkaline Phosphatase      48 U/L                      39-117   AST                       23 U/L                      0-37   ALT                       21 U/L                      0-35   Total Protein             6.7 g/dL                    1.6-1.0   Albumin                   3.6 g/dL                    9.6-0.4            Comments:      CBC Platelet w/Diff (CBCD)   White Cell Count          5.7 K/uL                    4.5-10.5   Red Cell Count            4.36 Mil/uL                 3.87-5.11   Hemoglobin                13.8 g/dL                   54.0-98.1   Hematocrit                40.8 %                      36.0-46.0   MCV                       93.5 fl                     78.0-100.0   Platelet Count            171.0 K/uL                  150.0-400.0   Neutrophil %              64.8 %                      43.0-77.0   Lymphocyte %              18.7 %                      12.0-46.0   Monocyte %           [H]  13.1 %                      3.0-12.0   Eosinophils%  3.2 %                       0.0-5.0   Basophils %               0.2 %                       0.0-3.0  TSH (TSH)   FastTSH                   1.78 uIU/mL                 0.35-5.50  Sed Rate (ESR)   Sed Rate                  17 mm/hr                    0-22   Impression & Recommendations:  Problem # 1:  HOARSENESS (EAV-409.81) We discussed neg exam & need to get a look at her cords... Rx w/ Mucinex, Fluids, MMW, & refer to ENT. Orders: ENT Referral (ENT)  Problem # 2:  HYPERTENSION (ICD-401.9) Controlled-  same meds. Her updated medication list for this  problem includes:    Pindolol 5 Mg Tabs (Pindolol) .[name]... Take 1/2 tablet two times a day    Amlodipine Besylate 2.5 Mg Tabs (Amlodipine besylate) .[name]... Take one by mouth evey am  Orders: T-2 View CXR ([id number])  Problem # 3:  PAROXYSMAL ATRIAL FIBRILLATION (ICD-427.31) Followed by [name] for Cards-  stable, same Rx. Her updated medication list for this problem includes:    Coumadin 5 Mg Tabs (Warfarin sodium) ..... Use as directed    Aspirin Adult Low Strength 81 Mg Tbec (Aspirin) .[name]... Take 2 tablets by mouth once daily    Flecainide Acetate 100 Mg Tabs (Flecainide acetate) .[name]... Take one tablet two times a day    Pindolol 5 Mg Tabs (Pindolol) .[name]... Take 1/2 tablet two times a day    Amlodipine Besylate 2.5 Mg Tabs (Amlodipine besylate) .[name]... Take one by mouth evey am  Problem # 4:  HYPERCHOLESTEROLEMIA (ICD-272.0) Stable on meds & diet... Her updated medication list for this problem includes:    Pravastatin Sodium 80 Mg Tabs (Pravastatin sodium) .[name]... Take 1 tablet by mouth once a day    Zetia 10 Mg Tabs (Ezetimibe) .[name]... Take 1 tablet by mouth once a day  Problem # 5:  GERD (ICD-530.81) GI is stable...  Problem # 6:  RENAL CELL CANCER (ICD-189.0) Followed by DrBorden & [name]... f/u CT planned for May11...  Problem # 7:  OTHER MEDICAL PROBLEMS AS NOTED>>>  Complete Medication List: 1)  Coumadin 5 Mg Tabs (Warfarin sodium) .... Use as directed 2)  Aspirin Adult Low Strength 81 Mg Tbec (Aspirin) .... Take 2 tablets by mouth once daily 3)  Flecainide Acetate 100 Mg Tabs (Flecainide acetate) .... Take one tablet two times a day 4)  Pindolol 5 Mg Tabs (Pindolol) .... Take 1/2 tablet two times a day 5)  Amlodipine Besylate 2.5 Mg Tabs (Amlodipine besylate) .... Take one by mouth evey am 6)  Pravastatin Sodium 80 Mg Tabs (Pravastatin sodium) .... Take 1 tablet by mouth once a day 7)  Zetia 10 Mg Tabs (Ezetimibe) .... Take 1 tablet by mouth once a day 8)  Fish Oil 1000 Mg Caps  (Omega-3 fatty acids) .... Take 1 cap once daily 9)  Coq10 30 Mg Caps (Coenzyme q10) .Marland KitchenMarland KitchenMarland Kitchen  Take 1 tablet by mouth once a day 10)  Cvs Folic Acid 400 Mcg Tabs (Folic acid) .... Take 1 tablet by mouth once a day 11)  One-a-day Extras Antioxidant Caps (Multiple vitamins-minerals) .... Take 1 capsule by mouth once a day 12)  Vitamin D 1000 Unit Tabs (Cholecalciferol) .... Take 1 tablet by mouth once a day 13)  Magic Mouthwash  .... 1 tsp gargle & swallow 4 times daily as directed...  Other Orders: Prescription Created Electronically [phone])  Patient Instructions: 1)  Today we updated your med list- see below.... 2)  We wrote a new perscription for MAGIC MOUTHWASH to gargle & swallow 4 times daily.Marland KitchenMarland Kitchen 3)  We will arrange for an ENT appt to get a look at your vocal cords... 4)  Today we did a follow up CXR.Marland KitchenMarland Kitchen 5)  Please return to our lab one morning soon for your FASTING blood work... 6)  Call for any questions.Marland KitchenMarland Kitchen 7)  Let's plan a recheck in 2-3 months to see how you are doing... Prescriptions: MAGIC MOUTHWASH 1 tsp gargle & swallow 4 times daily as directed...  #4 oz x prn   Entered and Authorized by:   Michele Mcalpine MD   Signed by:   Michele Mcalpine MD on 11/15/2009   Method used:   Print then Give to Patient   RxID:   [id number]

## 2010-10-30 NOTE — Medication Information (Signed)
Summary: rov/sl   Anticoagulant Therapy  Managed by: Lyna Poser, PharmD Referring MD: Valera Castle MD PCP: Dr. Pete Glatter Supervising MD: Antoine Poche MD, Fayrene Fearing Indication 1: Atrial Fibrillation (ICD-427.31) Lab Used: [facility] Site: [facility] INR POC 2.6 INR RANGE 2 - 3   Health status changes: yes       Details: heart fluttering   Bleeding/hemorrhagic complications: no    Recent/future hospitalizations: no    Any changes in medication regimen? no    Recent/future dental: no  Any missed doses?: no       Is patient compliant with meds? yes      Comments: started amiodarone a couple weeks ago before leaving hospital.   Allergies: 1)  ! Penicillin 2)  ! Codeine  Anticoagulation Management History:      The patient is taking warfarin and comes in today for a routine follow up visit.  Positive risk factors for bleeding include an age of 75 years or older.  The bleeding index is 'intermediate risk'.  Positive CHADS2 values include History of HTN and Age > 18 years old.  The start date was 05/07/2005.  Her last INR was 5.4.  Anticoagulation responsible provider: Antoine Poche MD, Fayrene Fearing.  INR POC: 2.6.  Cuvette Lot#: [id number].  Exp: 08/2011.    Anticoagulation Management Assessment/Plan:      The patient's current anticoagulation dose is Coumadin 5 mg  tabs: use as directed.  The target INR is 2 - 3.  The next INR is due 08/31/2010.  Anticoagulation instructions were given to patient.  Results were reviewed/authorized by Lyna Poser, PharmD.         Prior Anticoagulation Instructions: INR 3.7  Do not take Coumadin today, Friday, November 11th. Then, take Coumadin 1 tab (5 mg) every day. Return to clinic in 10 days.   Current Anticoagulation Instructions: INR 2.6 Continue taking 1 tablet everyday. Recheck in 2 weeks.

## 2010-10-30 NOTE — Progress Notes (Signed)
Summary: heart racing since yesterday   Phone Note Call from Patient   Caller: Patient Reason for Call: Talk to Nurse Summary of Call: pt heart racing since yesterday pm-denies any other symptoms other than fatigue -pls call [phone] until [phone] or [phone] Initial call taken by: Glynda Jaeger,  July 30, 2010 9:08 AM  Follow-up for Phone Call        per lisa office [phone] calling back for pt Lorne Skeens  July 30, 2010 9:34 AM  Returned patient call and no answer.  I will call back later. Mylo Red RN  Additional Follow-up for Phone Call Additional follow up Details #1::        PT calling back [phone] Judie Grieve  July 30, 2010 10:36 AM Mrs. Wanamaker calls today with concern about elevated bp and hr. Early am 136/105 hr 117.  She says she has been in atrial fib since Sunday.  She is feeling tired and frustrated.  She was unable to go to work b/c of this.  The nurse from [facility] rechecked her bp and according to pt " got pretty much the same thing, and she wouldn't let me drive".  Mrs. Richeson also states she was a little nauseous this morning and the nausea resolved after eating breakfast.  She is not short of breath and is not having any dizziness. Reassurance was given to her. Her current bp is 127/108 hr 104.  She has taken her medications.  She is aware Dr. Daleen Squibb is out of the office today.  She does not want to go to the hospital. I will forward to Dr. Daleen Squibb.  I told her I will check on her back this afternoon and that if she were to develop any symptoms (shortness of breath or dizziness)  to please call us.  She sounded less frustrated by the end of our conversation. Mylo Red RN      Appended Document: heart racing since yesterday LMTCB. Mylo Red RN  Appended Document: heart racing since yesterday S he will have recurrent PAF. Unless really symptomatic and doesnt stop, come to hospital.

## 2010-10-30 NOTE — Medication Information (Signed)
Summary: rov/to   Anticoagulant Therapy  Managed by: Geoffry Paradise, PharmD Referring MD: Valera Castle MD PCP: Dr. Pete Glatter Supervising MD: Johney Frame MD, Fayrene Fearing Indication 1: Atrial Fibrillation (ICD-427.31) Lab Used: [facility] Site: [facility] INR POC 3.5 INR RANGE 2 - 3           Allergies: 1)  ! Penicillin 2)  ! Codeine  Anticoagulation Management History:      Positive risk factors for bleeding include an age of 70 years or older.  The bleeding index is 'intermediate risk'.  Positive CHADS2 values include History of HTN and Age > 38 years old.  The start date was 05/07/2005.  Her last INR was 0.94.  Anticoagulation responsible provider: Sondi Desch MD, Fayrene Fearing.  INR POC: 3.5.  Cuvette Lot#: [id number].  Exp: 07/2011.    Anticoagulation Management Assessment/Plan:      The patient's current anticoagulation dose is Coumadin 5 mg  tabs: use as directed.  The target INR is 2 - 3.  The next INR is due 09/21/2010.  Anticoagulation instructions were given to patient.  Results were reviewed/authorized by Geoffry Paradise, PharmD.         Prior Anticoagulation Instructions: INR 3.2  Continue same dose of 1 tablet every day.  Recheck INR in 1 week.   Current Anticoagulation Instructions: INR: 3.5   Your INR is high today.  Skip tonight's dose of Coumadin.  Then take 1 tablet everyday except for 1/2 a tablet on Monday.  Please return to clinic in 2 weeks for another INR check.

## 2010-10-30 NOTE — Progress Notes (Signed)
   Phone Note Outgoing Call   Call placed by: Bjorn Loser Call placed to: Patient Details for Reason: Follow-up on heart rate Summary of Call: Patient states that she has been taking her heart rate over the weekend after going back on Amiodarone three times a day. She states that the rate varied from 112-120.  She did take PRN Cardizem yesterday.  This morning her HR was 66 but stated that it was likely to go back up this afternoon. Pt states that Dr. Johney Frame is following her now.  I adv pt that I would send the info to him and his nurse for further follow-up as they deemed necessary.   Initial call taken by: Claris Gladden RN,  August 20, 2010 10:07 AM  Follow-up for Phone Call        spoke with pt and advised her to continue to take the Amiodarone 200mg  three times a day and the Cardizem 30mg  q 8 hours as needed afib.  Alos keep follow up apt 12/09 with Dr Johney Frame.  Her HR today is 66.  Will ask Dr Johney Frame on Jhonnie [address]ner if there is anything else  she can do.  Pt aware doctor not in office until Evonnie Pat, RN, BSN  August 20, 2010 11:09 AM Follow-up by: Hillis Range, MD,  August 22, 2010 10:15 PM  Additional Follow-up for Phone Call Additional follow up Details #1::        agree with above plan She should contact our office again if not improved.  She may require cardioversion if she remains in afib. Additional Follow-up by: Hillis Range, MD,  August 22, 2010 10:15 PM

## 2010-10-30 NOTE — Medication Information (Signed)
Summary: Anne Hayes   Anticoagulant Therapy  Managed by: Reina Fuse, PharmD Referring MD: Valera Castle MD PCP: Dr. Salome Holmes MD: Myrtis Ser MD, Tinnie Gens Indication 1: Atrial Fibrillation (ICD-427.31) Lab Used: [facility] Site: [facility] INR POC 1.0 INR RANGE 2 - 3  Dietary changes: yes       Details: Ate extra greens last week.   Health status changes: no    Bleeding/hemorrhagic complications: no    Recent/future hospitalizations: no    Any changes in medication regimen? yes       Details: Started amiodarone two weeks ago.  Recent/future dental: no  Any missed doses?: no       Is patient compliant with meds? yes      Comments: Pt was scheduled for cardioversion today. Labs drawn yesterday revealed INR of 0.94. Pt reports compliance with her warfarin and no changes. With the new start to amiodarone it would be expected  INR would be supratherapeutic rather than subtherapeutic. Will boost and recheck in 1 week.   Allergies: 1)  ! Penicillin 2)  ! Codeine  Anticoagulation Management History:      The patient is taking warfarin and comes in today for a routine follow up visit.  Positive risk factors for bleeding include an age of 75 years or older.  The bleeding index is 'intermediate risk'.  Positive CHADS2 values include History of HTN and Age > 70 years old.  The start date was 05/07/2005.  Her last INR was 0.94.  Anticoagulation responsible provider: Myrtis Ser MD, Tinnie Gens.  INR POC: 1.0.  Cuvette Lot#: [id number].  Exp: 08/2011.    Anticoagulation Management Assessment/Plan:      The patient's current anticoagulation dose is Coumadin 5 mg  tabs: use as directed.  The target INR is 2 - 3.  The next INR is due 09/04/2010.  Anticoagulation instructions were given to patient.  Results were reviewed/authorized by Reina Fuse, PharmD.  She was notified by Reina Fuse PharmD.         Prior Anticoagulation Instructions: INR 2.6 Continue taking 1 tablet everyday. Recheck in 2 weeks.    Current Anticoagulation Instructions: INR 1.0  Today, Tuesday, November 29th and tomorrow, November 30th take Coumadin 2 tabs (10 mg). Then take Coumadin 1 tab (5 mg) daily. Return to clinic in 1 week.

## 2010-10-30 NOTE — Progress Notes (Signed)
Summary: c/o dizziness, afib- would like to be seen today   Phone Note Call from Patient Call back at [facility] Phone [phone]   Caller: Patient Details for Reason: c/o dizziness, afib, b/p  today 90/62.  pt would like to be seen.  Initial call taken by: Lorne Skeens,  August 03, 2010 8:54 AM  Follow-up for Phone Call        Pt calls today b/c she has increasing dizziness and is not feeling well with her rhythm.  bp 90/60 hr 97. Dr. Daleen Squibb & Rosann Auerbach are aware and patient will be transported to hospital this am. Mylo Red RN

## 2010-10-30 NOTE — Medication Information (Signed)
Summary: rov/tm  Anticoagulant Therapy  Managed by: Eda Keys, PharmD Referring MD: Valera Castle MD PCP: Merry Proud MD: Tenny Craw MD, Gunnar Fusi Indication 1: Atrial Fibrillation (ICD-427.31) Lab Used: [facility] Site: [facility] INR POC 2.2 INR RANGE 2 - 3  Dietary changes: no    Health status changes: no    Bleeding/hemorrhagic complications: no    Recent/future hospitalizations: yes       Details: see note below regarding procedure  Any changes in medication regimen? no    Recent/future dental: no  Any missed doses?: no       Is patient compliant with meds? yes      Comments: eye lid tuck 03/28/10, Dr. Daleen Squibb approved holding coumadin 5 days in advance of procedure.  Will follow up with this at next appt.    Allergies: 1)  ! Penicillin 2)  ! Codeine  Anticoagulation Management History:      The patient is taking warfarin and comes in today for a routine follow up visit.  Positive risk factors for bleeding include an age of 75 years or older.  The bleeding index is 'intermediate risk'.  Positive CHADS2 values include History of HTN and Age > 12 years old.  The start date was 05/07/2005.  Her last INR was 5.4.  Anticoagulation responsible provider: Tenny Craw MD, Gunnar Fusi.  INR POC: 2.2.  Cuvette Lot#: [id number].  Exp: 8/12.    Anticoagulation Management Assessment/Plan:      The patient's current anticoagulation dose is Coumadin 5 mg  tabs: use as directed.  The target INR is 2 - 3.  The next INR is due 03/16/2010.  Anticoagulation instructions were given to patient.  Results were reviewed/authorized by Eda Keys, PharmD.  She was notified by Eda Keys, PharmD.         Prior Anticoagulation Instructions: INR 3.1 Change dose to 1 pill everyday except 1 1/2 pill on Tuesdays and Saturdays. Recheck in 2-3 weeks.   Current Anticoagulation Instructions: INR 2.2  Continue taking 1.5 tablets on Tuesday and Saturday and 1 tablet all other days.  Return to clinic in 4  weeks.  Patient pending procedure, please inquire at next visit.

## 2010-10-30 NOTE — Progress Notes (Signed)
  Phone Note Other Incoming   Request: Send information Summary of Call: Request for records received from [facility]. Request forwarded to [employer] to send 3 years of records.

## 2010-10-30 NOTE — Progress Notes (Signed)
   12 Lead faxed to [facility] @ fax [phone] [facility]  March 20, 2010 1:22 PM

## 2010-10-30 NOTE — Progress Notes (Signed)
Summary: pt having low bp   Phone Note Call from Patient   Caller: Patient Reason for Call: Talk to Nurse Summary of Call: pt still having low bp-pulse rate [phone] Initial call taken by: Glynda Jaeger,  July 03, 2010 3:49 PM  Follow-up for Phone Call        Spoke with patient. Pt. states her b/p today ranges from 172/73 to 144/63 pulse 46 to 49. Pt states she feels a little weak. On 06/28/10 pt's b/p was ranging from 96 to 98 systalic. Amlodipine 2.5 mg was stop per Dr. wall MD. On 06/29/10 pt's b/p was 150/64 to 148/68. Amlodipine 2.5 mg daily was restarted per DOD. According to pt. she  is taken the Amlidipine 2.5 mg daily. An appointment was made with the PA for tomorrow at 10:15 AM . Pt. aware. Follow-up by: Ollen Gross, RN, BSN,  July 03, 2010 4:21 PM

## 2010-10-30 NOTE — Progress Notes (Signed)
Summary: heart rate 112**LMTCB**   Phone Note Call from Patient   Caller: Patient Reason for Call: Talk to Nurse Summary of Call: pt states heart rate is 112. pt states heart is fluttering. no chest pain no sob.  Initial call taken by: Roe Coombs,  August 16, 2010 10:45 AM  Follow-up for Phone Call        I spoke with the pt and she c/o fast heart rate that has been constant since yesterday morning.   The pt c/o "a little" SOB and fatigue.  Pt denies CP.  The pt's pulse is running around 120.  BP 129/73.  The only change that the pt has made in her medications was decreasing her Amiodarone to bid yesterday.  I will speak with Dr Johney Frame and call the pt back.  Follow-up by: Julieta Gutting, RN, BSN,  August 16, 2010 10:52 AM  Additional Follow-up for Phone Call Additional follow up Details #1::        Per Dr Johney Frame the pt should go back to Amiodarone three times a day and use as needed Cardizem. He would like the pt to call the office tomorrow and let us know how she is feeling. I left a message for the pt to call back.  Julieta Gutting, RN, BSN  August 16, 2010 12:50 PM  Left message for pt to call back. Julieta Gutting, RN, BSN  August 16, 2010 3:41 PM    Additional Follow-up for Phone Call Additional follow up Details #2::    LMTCB Mylo Red RN   Appended Document: heart rate 112**LMTCB** pt in office. b/p 101/70, hr 110, denies cp and shob. adv pt to take Amiodarone three times a day and use Cardizem as needed q8. pt expressed understanding. Will f/u w/pt on Monday to see how doing. Also adv pt if becomes shob or devlop cp to seek emergency care. Claris Gladden, RN, BSN    Appended Document: heart rate 112**LMTCB** Left message on machine. I will call back tomorrow. Mylo Red RN

## 2010-10-30 NOTE — Letter (Signed)
Summary: Cardioversion/TEE Instructions  Architectural technologist, Main Office  [address]   [place], [place] [address]   Phone: [phone]  Fax: [phone]    Cardioversion 08/27/2010 MRN: [id number]  [facility] [address] [place], [place]  [address]  Dear Ms. Selman, You are scheduled for a Cardioversion on 08/28/10 with Dr. Elease Hashimoto.   Please arrive at the [facility] at 9:00 a.m.  on the day of your procedure.  1)   DIET:  A)   Nothing to eat or drink after midnight except your medications with a sip of water.    2)   Come to the [place] office on 08/27/10 for lab work.  You do not have to be fasting.  3)   MAKE SURE YOU TAKE YOUR COUMADIN.  4) B)   YOU MAY TAKE ALL of your remaining medications with a small amount of water.     5)  Must have a responsible person to drive you home.  6)   Bring a current list of your medications and current insurance cards.   * Special Note:  Every effort is made to have your procedure done on time. Occasionally there are emergencies that present themselves at the hospital that may cause delays. Please be patient if a delay does occur.  * If you have any questions after you get home, please call the office at [phone]. Anselm Pancoast

## 2010-10-30 NOTE — Progress Notes (Signed)
Summary: xray  Phone Note Call from Patient   Caller: Patient Call For: Anne Hayes Summary of Call: calling to see if copy of chest xray have been sent to Dr Anne Hayes office Initial call taken by: Anne Hayes,  December 29, 2009 1:13 PM  Follow-up for Phone Call        xray faxed per pt request to dr. Laverle Hayes. pt aware.  Anne Hayes CMA  December 29, 2009 1:36 PM

## 2010-10-30 NOTE — Medication Information (Signed)
Summary: rov/cb  Anticoagulant Therapy  Managed by: Cloyde Reams, RN, BSN Referring MD: Valera Castle MD PCP: Merry Proud MD: Johney Frame MD, Fayrene Fearing Indication 1: Atrial Fibrillation (ICD-427.31) Lab Used: [facility] Site: [facility] INR POC 1.6 INR RANGE 2 - 3  Dietary changes: no    Health status changes: no    Bleeding/hemorrhagic complications: no    Recent/future hospitalizations: no    Any changes in medication regimen? no    Recent/future dental: no  Any missed doses?: yes     Details: Off several days prior to eye surgery, resumed on Thurs 03/29/10.   Is patient compliant with meds? yes       Allergies: 1)  ! Penicillin 2)  ! Codeine  Anticoagulation Management History:      The patient is taking warfarin and comes in today for a routine follow up visit.  Positive risk factors for bleeding include an age of 24 years or older.  The bleeding index is 'intermediate risk'.  Positive CHADS2 values include History of HTN and Age > 28 years old.  The start date was 05/07/2005.  Her last INR was 5.4.  Anticoagulation responsible provider: Jayd Cadieux MD, Fayrene Fearing.  INR POC: 1.6.  Cuvette Lot#: [id number].  Exp: 06/2011.    Anticoagulation Management Assessment/Plan:      The patient's current anticoagulation dose is Coumadin 5 mg  tabs: use as directed.  The target INR is 2 - 3.  The next INR is due 04/18/2010.  Anticoagulation instructions were given to patient.  Results were reviewed/authorized by Cloyde Reams, RN, BSN.  She was notified by Cloyde Reams RN.         Prior Anticoagulation Instructions: INR 1.0. Start Coumadin when MD tells you to after the procedure.  Take Coumadin 1.5 tablets for 2 days, then take 1 tablet daily except 1.5 tablets on Tues and Sat. Recheck on 04/04/10.  Current Anticoagulation Instructions: INR 1.6  Take 1.5 tablets today and tomorrow, then resume same dosage 1 tablet daily except 1.5 tablets on Tuesdays and Thursdays.  Recheck in 2 weeks.

## 2010-10-30 NOTE — Medication Information (Signed)
Summary: rov/jk  Anticoagulant Therapy  Managed by: Cloyde Reams, RN, BSN Referring MD: Valera Castle MD PCP: Merry Proud MD: Myrtis Ser MD, Tinnie Gens Indication 1: Atrial Fibrillation (ICD-427.31) Lab Used: [facility] Site: [facility] INR POC 2.6 INR RANGE 2 - 3  Dietary changes: no    Health status changes: no    Bleeding/hemorrhagic complications: no    Recent/future hospitalizations: no    Any changes in medication regimen? no    Recent/future dental: no  Any missed doses?: no       Is patient compliant with meds? yes       Allergies: 1)  ! Penicillin 2)  ! Codeine  Anticoagulation Management History:      The patient is taking warfarin and comes in today for a routine follow up visit.  Positive risk factors for bleeding include an age of 75 years or older.  The bleeding index is 'intermediate risk'.  Positive CHADS2 values include History of HTN and Age > 75 years old.  The start date was 05/07/2005.  Her last INR was 5.4.  Anticoagulation responsible provider: Myrtis Ser MD, Tinnie Gens.  INR POC: 2.6.  Cuvette Lot#: [id number].  Exp: 08/2011.    Anticoagulation Management Assessment/Plan:      The patient's current anticoagulation dose is Coumadin 5 mg  tabs: use as directed.  The target INR is 2 - 3.  The next INR is due 07/13/2010.  Anticoagulation instructions were given to patient.  Results were reviewed/authorized by Cloyde Reams, RN, BSN.  She was notified by Cloyde Reams RN.         Prior Anticoagulation Instructions: INR 2.3  Continue taking 1 tablet (5mg ) every day except take 1.5 tablets (7.5mg ) on Tuesdays and Thursdays.  Recheck in 4 weeks.   Current Anticoagulation Instructions: INR 2.6  Continue on same dosage 5mg  daily except 7.5mg  on Tuesdays and Thursdays.  Recheck in 4 weeks.

## 2010-10-30 NOTE — Progress Notes (Signed)
   Phone Note Other Incoming   Reason for Call: Discuss lab or test results Details for Reason: INR O.94 Details of Action Taken: Post poned DCCV Summary of Call: Anne Hayes has symptomatic Afib and was seen in the office on 11/28, STAT PT/INR ordered after plan for DCCV made/scheduled for 08/28/10.  Unfortunately, her INR came back well below therapeutic range at 0.94.  This is curious as INR 2.39 on 08/07/10 & she reports excellent compliance.  She does admit to increased consumption of "greens" but still ? accuracy of this lab.  Will cancel DCCV for now and recheck PT/INR asap in the office.  Reschedule DCCV after INR determined to be therapeutic.  Discussed this plan with [name] and she is OK with it as outlined above. Initial call taken by: Lenard Galloway

## 2010-10-30 NOTE — Progress Notes (Signed)
   Phone Note Call from Patient   Caller: Patient Summary of Call: pt called this AM, complaining of increased HR of 114 to 118. she felt she was back in A fib. she denied any associated symptoms. she had not yet taken her AM meds, including Sotalol. I called back a few hours later. she still remained asymptomatic and, in fact, stated that she felt "fine". however, she now was reporting a HR of 46, with BP 126/62. I advised her to call me in the event she were to develop any symptoms of dizziness or near-syncope. she was reassured by this, but still frustrated that she might be going in/out of A fib. Initial call taken by: Nelida Meuse, PA-C,  July 21, 2010 4:43 PM  Follow-up for Phone Call        Called pt to check on her. She states heart beat returned to regular rhythm  Sunday AM. At that time heart rate was 46, BP 129/64. Rhythm has been regular since then. At 8:15 this AM heart rate was 47 and BP 144/61. She states she felt tired when in afib but feels fine today.  I told pt I would forward note to Dr. Daleen Squibb and call her back if he wanted to make any changes Follow-up by: Dossie Arbour, RN, BSN,  July 23, 2010 9:49 AM  Additional Follow-up for Phone Call Additional follow up Details #1::        delighted it was that slow. no changes for now. Additional Follow-up by: Gaylord Shih, MD, [facility],  July 23, 2010 12:26 PM

## 2010-10-30 NOTE — Progress Notes (Signed)
Summary: carb list   Phone Note Call from Patient Call back at Home Phone [phone]   Caller: Patient Reason for Call: Talk to Nurse Summary of Call: req to speak to Anne Hayes about a list of carbs she can eat and can not eat Initial call taken by: Migdalia Dk,  December 12, 2009 2:14 PM  Follow-up for Phone Call        list mailed to patient..mp Follow-up by: Anne Hayes PharmD, BCPS, CPP,  December 12, 2009 2:19 PM

## 2010-10-30 NOTE — Letter (Signed)
Summary: [facility] Medical Clearance   [facility] Medical Clearance   Imported By: Roderic Ovens 02/15/2010 13:29:15  _____________________________________________________________________  External Attachment:    Type:   Image     Comment:   External Document

## 2010-10-30 NOTE — Progress Notes (Signed)
Summary: pt would like a diet plan sent to her   Phone Note Call from Patient Call back at Home Phone [phone]   Caller: Patient Reason for Call: Talk to Nurse, Talk to Doctor Summary of Call: pt is on triglycerides and would like someone to mail her a list of things she can and can not eat Initial call taken by: Omer Jack,  December 22, 2009 3:48 PM  Follow-up for Phone Call        Pt. has not received carbohydrate food list sent by Shelby Dubin, PharmD. She is requesting to have it sent again. Will forward this note to Shelby Dubin. Pt. aware Corrie Dandy is not in office today and it would be early next week when list mailed Dossie Arbour, RN, BSN  December 22, 2009 4:16 PM  mailed...mp Follow-up by: Shelby Dubin PharmD, BCPS, CPP,  December 26, 2009 10:16 AM

## 2010-10-30 NOTE — Assessment & Plan Note (Signed)
Summary: eph/amber    Visit Type:  EPH Primary Provider:  Dr. Pete Glatter  CC:  pt states some sob at times....pt denies any other cardiac complaints today...pt states her heart rate has been in the 40's since she has been home from the hospital.  History of Present Illness: Ms Domek returns today for post hospital followup for atrial fibrillation. Please see the discharge summary for fairly complicated hospital course.  She finally held sinus rhythm and mostly sinus bradycardia with sotalol 120 mg  twice a day.  Since discharge she had no further recurrent symptoms of atrial fib. She denies palpitations, chest discomfort, dyspnea on exertion, presyncope or syncope. Her blood pressures been under relatively good control her heart rates have been in the mid 40s to high 40s when she checked it.  Current Medications (verified): 1)  Coumadin 5 Mg  Tabs (Warfarin Sodium) .... Use As Directed 2)  Aspirin Adult Low Strength 81 Mg Tbec (Aspirin) .[name].. 1 Tab Once Daily 3)  Amlodipine Besylate 5 Mg  Tabs (Amlodipine Besylate) .... One Tablet By Mouth Daily 4)  Pravastatin Sodium 80 Mg  Tabs (Pravastatin Sodium) .... Take 1 Tablet By Mouth Once A Day 5)  Zetia 10 Mg  Tabs (Ezetimibe) .... Take 1 Tablet By Mouth Once A Day 6)  Fish Oil 1000 Mg  Caps (Omega-3 Fatty Acids) .... Take 1 Cap Once Daily 7)  Cvs Folic Acid 400 Mcg  Tabs (Folic Acid) .... Take 1 Tablet By Mouth Once A Day 8)  Vitamin D 1000 Unit  Tabs (Cholecalciferol) .... Take 1 Tablet By Mouth Once A Day 9)  Magic Mouthwash .... 1 Tsp Gargle & Swallow, As Needed 10)  Sotalol Hcl 120 Mg Tabs (Sotalol Hcl) .[name].. 1 Tab Two Times A Day 11)  Coq10 30 Mg Caps (Coenzyme Q10) .[name].. 1 Cap Once Daily 12)  Folic Acid 1 Mg Tabs (Folic Acid) .[name].. 1 Tab Once Daily 13)  Calcium-Vitamin D 500-200 Mg-Unit Tabs (Calcium Carbonate-Vitamin D) .[name].. 1 Tab Once Daily 14)  Omeprazole 40 Mg Cpdr (Omeprazole) .[name].. 1 Tab Once Daily  Allergies: 1)  !  Penicillin 2)  ! 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  Alcohol Use - yes Retired   Risk Factors: Smoking Status: quit (12/20/2008)  Review of Systems       negative other than history of present illness  Vital Signs:  Patient profile:   77 year old female Height:      66.5 inches Weight:      157 pounds BMI:     25.05 Pulse rate:   40 / minute Pulse rhythm:   irregular BP sitting:   130 / 62  (left arm) Cuff size:   large  Vitals Entered By: Danielle Rankin, CMA (July 19, 2010 12:02 PM)  Physical Exam  General:  Well developed, well nourished, in no acute distress. Head:  normocephalic and atraumatic Eyes:  PERRLA/EOM intact; conjunctiva and lids normal. Neck:  Neck  supple, no JVD. No masses, thyromegaly or abnormal cervical nodes. Chest Zarian Colpitts:  no deformities or breast masses noted Lungs:  Clear bilaterally to auscultation and percussion. Heart:  PMI nondisplaced, slow rate and rhythm is regular, normal S1-S2, soft systolic murmur, no carotid bruits Msk:  Back normal, normal gait. Muscle strength and tone normal. Pulses:  pulses normal in all 4 extremities Extremities:  No clubbing or cyanosis. Neurologic:  Alert and oriented x 3. Skin:   Intact without lesions or rashes. Psych:  Normal affect.   Impression & Recommendations:  Problem # 1:  ATRIAL FIBRILLATION (ICD-427.31) Assessment Improved Will continue sotalol. She increased her heart rate is 72 beats per minute walking in the office. She is having no symptoms of bradycardia. If she breaks through she'll need to see Dr. Delories Heinz before further therapy. If she develops symptomatic bradycardia we will also have to make changes. I will see her back in 3 months. Her updated medication list for this problem includes:    Coumadin 5 Mg Tabs (Warfarin sodium) ..... Use as directed    Aspirin Adult Low Strength 81 Mg Tbec (Aspirin) .[name]... 1 tab once daily    Sotalol Hcl 120 Mg Tabs (Sotalol hcl) .[name]... 1 tab two times a day  Problem # 2:  COUMADIN THERAPY (ICD-V58.61) Assessment: Unchanged  Problem # 3:  HYPERTENSION (ICD-401.9) Assessment: Unchanged  Her updated medication list for this problem includes:    Aspirin Adult Low Strength 81 Mg Tbec (Aspirin) .[name]... 1 tab once daily    Amlodipine Besylate 5 Mg Tabs (Amlodipine besylate) ..... One tablet by mouth daily    Sotalol Hcl 120 Mg Tabs (Sotalol hcl) .[name]... 1 tab two times a day  Patient Instructions: 1)  Your physician recommends that you schedule a follow-up appointment in: 3 months

## 2010-10-30 NOTE — Medication Information (Signed)
Summary: rov/ewj  Anticoagulant Therapy  Managed by: Elaina Pattee, PharmD Referring MD: Valera Castle MD PCP: Merry Proud MD: Graciela Husbands MD, Viviann Spare Indication 1: Atrial Fibrillation (ICD-427.31) Lab Used: [facility] Site: [facility] INR POC 1.7 INR RANGE 2 - 3  Dietary changes: yes       Details: Pt still eating more greens/salads.  Health status changes: no    Bleeding/hemorrhagic complications: no    Recent/future hospitalizations: no    Any changes in medication regimen? yes       Details: New medication started for hiatal hernia. Pt will bring next visit.  Recent/future dental: no  Any missed doses?: no       Is patient compliant with meds? yes       Allergies: 1)  ! Penicillin 2)  ! Codeine  Anticoagulation Management History:      The patient is taking warfarin and comes in today for a routine follow up visit.  Positive risk factors for bleeding include an age of 75 years or older.  The bleeding index is 'intermediate risk'.  Positive CHADS2 values include History of HTN and Age > 57 years old.  The start date was 05/07/2005.  Her last INR was 5.4.  Anticoagulation responsible provider: Graciela Husbands MD, Viviann Spare.  INR POC: 1.7.  Cuvette Lot#: [id number].  Exp: 01/2011.    Anticoagulation Management Assessment/Plan:      The patient's current anticoagulation dose is Coumadin 5 mg  tabs: use as directed.  The target INR is 2 - 3.  The next INR is due 01/23/2010.  Anticoagulation instructions were given to patient.  Results were reviewed/authorized by Elaina Pattee, PharmD.  She was notified by Elaina Pattee, PharmD.         Prior Anticoagulation Instructions: INR 2.0  Take 2 tablets tonight, then resume same dosage 1 tablet daily except 1.5 tablets on Tuesdays and Thursdays.  Recheck in 4 weeks.    Current Anticoagulation Instructions: INR 1.7. Take 2 tablets today, then take 1 tablet daily except 1.5 tablets Tues, Thurs, Sat.

## 2010-10-30 NOTE — Assessment & Plan Note (Signed)
Summary: eph/jml  Medications Added AMIODARONE HCL 200 MG TABS (AMIODARONE HCL) 1 tab  daily        Visit Type:  Follow-up Primary Provider:  Dr. Pete Glatter   History of Present Illness: The patient presents today for urgent electrophysiology followup.  She continues to have symptomatic persistent atrial fibrillation. She reports feeling fatigued and "washed out".  She reports compliance with coumadin and amiodarone.   We planned [facility] last week, however INRs were subtherapeutic.  The patient denies symptoms of chest pain, shortness of breath, orthopnea, PND, lower extremity edema, dizziness, presyncope, syncope, or neurologic sequela. The patient is tolerating medications without difficulties and is otherwise without complaint today.   Current Medications (verified): 1)  Coumadin 5 Mg  Tabs (Warfarin Sodium) .... Use As Directed 2)  Aspirin Adult Low Strength 81 Mg Tbec (Aspirin) .[name].. 1 Tab Once Daily 3)  Amlodipine Besylate 5 Mg  Tabs (Amlodipine Besylate) .... One Tablet By Mouth Daily 4)  Pravastatin Sodium 80 Mg  Tabs (Pravastatin Sodium) .... Take 1 Tablet By Mouth Once A Day 5)  Zetia 10 Mg  Tabs (Ezetimibe) .... Take 1 Tablet By Mouth Once A Day 6)  Fish Oil 1000 Mg  Caps (Omega-3 Fatty Acids) .... Take 1 Cap Once Daily 7)  Cvs Folic Acid 400 Mcg  Tabs (Folic Acid) .... Take 1 Tablet By Mouth Once A Day 8)  Vitamin D 1000 Unit  Tabs (Cholecalciferol) .... Take 1 Tablet By Mouth Once A Day 9)  Magic Mouthwash .... 1 Tsp Gargle & Swallow, As Needed 10)  Coq10 30 Mg Caps (Coenzyme Q10) .[name].. 1 Cap Once Daily 11)  Folic Acid 1 Mg Tabs (Folic Acid) .[name].. 1 Tab Once Daily 12)  Calcium-Vitamin D 500-200 Mg-Unit Tabs (Calcium Carbonate-Vitamin D) .[name].. 1 Tab Once Daily 13)  Omeprazole 40 Mg Cpdr (Omeprazole) .[name].. 1 Tab Once Daily 14)  Amiodarone Hcl 200 Mg Tabs (Amiodarone Hcl) .[name].. 1 Tab Three Times A Day  Allergies: 1)  ! Penicillin 2)  ! 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 S/P appendectomy S/P hysterectomy in 1977 S/P cholecystectomy in 1973 S/P bilat bunionectomies S/P AP repair & Raz urethropexy in 1989  Family History: Reviewed history from 12/20/2008 and no changes required. non contributory  Social History: Reviewed history from 06/16/2009 and no changes required. Widowed  Tobacco Use - Former.  Alcohol Use - yes Retired   Review of Systems       All systems are reviewed and negative except as listed in the HPI.   Vital Signs:  Patient profile:   77 year old female Height:      66.5 inches Weight:      156 pounds BMI:     24.89 Pulse rate:   88 / minute BP sitting:   128 / 80  (left arm)  Vitals Entered By: Laurance Flatten CMA (September 07, 2010 2:20 PM)  Physical Exam  General:  Well developed, well nourished, in no acute distress. Head:  normocephalic and atraumatic Eyes:  PERRLA/EOM intact; conjunctiva and lids normal. Mouth:  Teeth, gums and palate normal. Oral mucosa normal. Neck:  Neck supple, no JVD. No masses, thyromegaly or abnormal cervical  nodes. Lungs:  Clear bilaterally to auscultation and percussion. Heart:  iRRR ,no m/r/g Abdomen:  Bowel sounds positive; abdomen soft and non-tender without masses, organomegaly, or hernias noted. No hepatosplenomegaly. Msk:  Back normal, normal gait. Muscle strength and tone normal. Pulses:  pulses normal in all 4 extremities Extremities:  No clubbing or cyanosis. Neurologic:  Alert and oriented x 3.  old R facial droop  (mild),  CNII-XII otherwise intact, strength/sensation are intact   Impression & Recommendations:  Problem # 1:  ATRIAL FIBRILLATION (ICD-427.31) The patient has symptomatic persistent atrial fibrillation.  She has now been loaded with amiodarone for several weeks.  R/B/A to TEE guieded cardioversion were discussed with the patient.  She is willing to proceed with cardioversion.  If she has further difficulties with afib, then we will consider catheter ablation as our next strategy. We will decrease amiodarone to 200mg  daily at this time.  Problem # 2:  SINUS BRADYCARDIA (ICD-427.81) decrease amiodarone to 200mg  daily I would anticipate that after cardioversion, her heart rate would return to 40s.  She has been asymptomatic previously.  Problem # 3:  HYPERTENSION (ICD-401.9) stable  Patient Instructions: 1)  Your physician has recommended you make the following change in your medication: decrease Amiodarone to once daily

## 2010-10-30 NOTE — Assessment & Plan Note (Signed)
Summary: rov    Visit Type:  Follow-up Primary Provider:  Dr. Pete Glatter   History of Present Illness: The patient presents today for urgent electrophysiology followup.  She continues to have symptomatic persistent atrial fibrillation.  She feels that she has been in afib for a week with heart rates 110s.  She reports feeling fatigued and "washed out".  She reports compliance with coumadin and amiodarone.  The patient denies symptoms of chest pain, shortness of breath, orthopnea, PND, lower extremity edema, dizziness, presyncope, syncope, or neurologic sequela. The patient is tolerating medications without difficulties and is otherwise without complaint today.   Current Medications (verified): 1)  Coumadin 5 Mg  Tabs (Warfarin Sodium) .... Use As Directed 2)  Aspirin Adult Low Strength 81 Mg Tbec (Aspirin) .[name].. 1 Tab Once Daily 3)  Amlodipine Besylate 5 Mg  Tabs (Amlodipine Besylate) .... One Tablet By Mouth Daily 4)  Pravastatin Sodium 80 Mg  Tabs (Pravastatin Sodium) .... Take 1 Tablet By Mouth Once A Day 5)  Zetia 10 Mg  Tabs (Ezetimibe) .... Take 1 Tablet By Mouth Once A Day 6)  Fish Oil 1000 Mg  Caps (Omega-3 Fatty Acids) .... Take 1 Cap Once Daily 7)  Cvs Folic Acid 400 Mcg  Tabs (Folic Acid) .... Take 1 Tablet By Mouth Once A Day 8)  Vitamin D 1000 Unit  Tabs (Cholecalciferol) .... Take 1 Tablet By Mouth Once A Day 9)  Magic Mouthwash .... 1 Tsp Gargle & Swallow, As Needed 10)  Coq10 30 Mg Caps (Coenzyme Q10) .[name].. 1 Cap Once Daily 11)  Folic Acid 1 Mg Tabs (Folic Acid) .[name].. 1 Tab Once Daily 12)  Calcium-Vitamin D 500-200 Mg-Unit Tabs (Calcium Carbonate-Vitamin D) .[name].. 1 Tab Once Daily 13)  Omeprazole 40 Mg Cpdr (Omeprazole) .[name].. 1 Tab Once Daily 14)  Amiodarone Hcl 200 Mg Tabs (Amiodarone Hcl) .[name].. 1 Tab Three Times A Day  Allergies: 1)  ! Penicillin 2)  ! 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 S/P appendectomy S/P hysterectomy in 1977 S/P cholecystectomy in 1973 S/P bilat bunionectomies S/P AP repair & Raz urethropexy in 1989  Social History: Reviewed history from 06/16/2009 and no changes required. Widowed  Tobacco Use - Former.  Alcohol Use - yes Retired   Review of Systems       All systems are reviewed and negative except as listed in the HPI.   Vital Signs:  Patient profile:   77 year old female Height:      66.5 inches Weight:      154 pounds BMI:     24.57 Pulse rate:   102 / minute BP sitting:   120 / 80  (left arm)  Vitals Entered By: Laurance Flatten CMA (August 27, 2010 4:16 PM)  Physical Exam  General:  Well developed, well nourished, in no acute distress. Head:  normocephalic and atraumatic Eyes:  PERRLA/EOM intact; conjunctiva and lids normal. Neck:  Neck supple, no JVD. No masses, thyromegaly or abnormal cervical nodes. Lungs:  Clear bilaterally to auscultation and percussion. Heart:  iRRR ,no m/r/g Abdomen:  Bowel sounds positive; abdomen soft and non-tender without masses, organomegaly, or hernias noted. No hepatosplenomegaly. Msk:  Back normal,  normal gait. Muscle strength and tone normal. Pulses:  pulses normal in all 4 extremities Extremities:  No clubbing or cyanosis. Neurologic:  Alert and oriented x 3.  old R facial droop (mild),  CNII-XII otherwise intact, strength/sensation are intact   EKG  Procedure date:  08/27/2010  Findings:      afib, V rate 102, poor r wave progression  Impression & Recommendations:  Problem # 1:  ATRIAL FIBRILLATION  (ICD-427.31) The patient has symptomatic persistent atrial fibrillation.  She has now been loaded with amiodarone for several weeks.  R/B/A to cardioversion were discussed with the patient.  She is willing to proceed with cardioversion.  If she has further difficulties with afib, then we will consider catheter ablation as our next strategy.  Problem # 2:  COUMADIN THERAPY (ICD-V58.61) goal INR 2-3  Problem # 3:  HYPERTENSION (ICD-401.9) stable  Problem # 4:  HYPERCHOLESTEROLEMIA (ICD-272.0) stable Her updated medication list for this problem includes:    Pravastatin Sodium 80 Mg Tabs (Pravastatin sodium) .[name]... Take 1 tablet by mouth once a day    Zetia 10 Mg Tabs (Ezetimibe) .[name]... Take 1 tablet by mouth once a day

## 2010-10-30 NOTE — Miscellaneous (Signed)
Summary: DCCV  Clinical Lists Changes  Orders: Added new Test order of T-Basic Metabolic Panel [phone]) - Signed Added new Test order of T-PTT [phone]) - Signed Added new Test order of T-Protime, Auto ([id number]) - Signed Added new Test order of T-CBC w/Diff ([id number]) - Signed Observations: Added new observation of PI CARDIO: Your physician has recommended that you have a cardioversion (DCCV).  Electrical cardioversion uses a jolt of electricity to your heart either through paddles or wired patches attached to your chest. This is a controlled, usually prescheduled, procedure. Defibrillation is done under light anesthesia in the hospital, and you usually go home the day of the procedure. This is done to get your heart back into a normal rhythm. You are not awake for the procedure. Please see the instruction sheet given to you today. (08/27/2010 16:39)      Patient Instructions: 1)  Your physician has recommended that you have a cardioversion (DCCV).  Electrical cardioversion uses a jolt of electricity to your heart either through paddles or wired patches attached to your chest. This is a controlled, usually prescheduled, procedure. Defibrillation is done under light anesthesia in the hospital, and you usually go home the day of the procedure. This is done to get your heart back into a normal rhythm. You are not awake for the procedure. Please see the instruction sheet given to you today.

## 2010-11-01 NOTE — Medication Information (Signed)
Summary: ccr  Anticoagulant Therapy  Managed by: Bethena Midget, RN, BSN Referring MD: Valera Castle MD PCP: Dr. Pete Glatter Supervising MD: Jens Som MD, Arlys John Indication 1: Atrial Fibrillation (ICD-427.31) Lab Used: [facility] Site: [facility] INR POC 5.2 INR RANGE 2 - 3  Dietary changes: no    Health status changes: no    Bleeding/hemorrhagic complications: no    Recent/future hospitalizations: no    Any changes in medication regimen? yes       Details: Pt has also been taking Amio TID, she didn't do the dose change from OV 12/9 down to once daily.   Recent/future dental: no  Any missed doses?: no       Is patient compliant with meds? yes      Comments: Pt states she has been taking 5mg s daily except 7.5mg s on T&T, and we thought she was taking 5mg s daily except 2.5mg s on Mondays.   Allergies: 1)  ! Penicillin 2)  ! Codeine  Anticoagulation Management History:      The patient is taking warfarin and comes in today for a routine follow up visit.  Positive risk factors for bleeding include an age of 75 years or older.  The bleeding index is 'intermediate risk'.  Positive CHADS2 values include History of HTN and Age > 35 years old.  The start date was 05/07/2005.  Her last INR was 0.94.  Anticoagulation responsible provider: Jens Som MD, Arlys John.  INR POC: 5.2.  Cuvette Lot#: [id number].  Exp: 11/2011.    Anticoagulation Management Assessment/Plan:      The patient's current anticoagulation dose is Coumadin 5 mg  tabs: use as directed.  The target INR is 2 - 3.  The next INR is due 10/05/2010.  Anticoagulation instructions were given to patient.  Results were reviewed/authorized by Bethena Midget, RN, BSN.  She was notified by Bethena Midget, RN, BSN.         Prior Anticoagulation Instructions: INR: 3.5   Your INR is high today.  Skip tonight's dose of Coumadin.  Then take 1 tablet everyday except for 1/2 a tablet on Monday.  Please return to clinic in 2 weeks for another INR  check.    Current Anticoagulation Instructions: INR 5.2 Skip today and Fridays dose. Then start on Saturday taking 1 pill everyday. Recheck in one week.

## 2010-11-01 NOTE — Medication Information (Signed)
Summary: rov  Anticoagulant Therapy  Managed by: Bethena Midget, RN, BSN Referring MD: Valera Castle MD PCP: Dr. Pete Glatter Supervising MD: Daleen Squibb MD, Maisie Fus Indication 1: Atrial Fibrillation (ICD-427.31) Lab Used: [facility] Site: [facility] INR POC 3.8 INR RANGE 2 - 3  Dietary changes: no    Health status changes: no    Bleeding/hemorrhagic complications: no    Recent/future hospitalizations: no    Any changes in medication regimen? no    Recent/future dental: no  Any missed doses?: no       Is patient compliant with meds? yes      Comments: Pending Ablation 11/27/10 with Dr Johney Frame  Allergies: 1)  ! Penicillin 2)  ! Codeine  Anticoagulation Management History:      The patient is taking warfarin and comes in today for a routine follow up visit.  Positive risk factors for bleeding include an age of 75 years or older.  The bleeding index is 'intermediate risk'.  Positive CHADS2 values include History of HTN and Age > 58 years old.  The start date was 05/07/2005.  Her last INR was 3.0.  Anticoagulation responsible provider: Daleen Squibb MD, Maisie Fus.  INR POC: 3.8.  Cuvette Lot#: [id number].  Exp: 10/2011.    Anticoagulation Management Assessment/Plan:      The patient's current anticoagulation dose is Coumadin 5 mg  tabs: use as directed.  The target INR is 2 - 3.  The next INR is due 10/29/2010.  Anticoagulation instructions were given to patient.  Results were reviewed/authorized by Bethena Midget, RN, BSN.  She was notified by Bethena Midget, RN, BSN.         Prior Anticoagulation Instructions: INR 3.0 (INR goal: 2-3)  Take 1 tablet every day except 1/2 tablet on Mondays.  Recheck in 2 weeks.     Current Anticoagulation Instructions: INR 3.8 Skip Tuesday's dose then change dose to 1 pill everyday except 1/2 pill on Mondays and Fridays. Recheck in one week.

## 2010-11-01 NOTE — Progress Notes (Signed)
Summary: pt in a-fib  Phone Note Call from Patient Call back at [facility] Phone [phone]   Caller: Patient Summary of Call: Pt in a-fib  Initial call taken by: Judie Grieve,  September 12, 2010 8:31 AM  Follow-up for Phone Call        spoke with pt and she went back out of rhythm last night  didn't last 24hours Discussed with Dr Johney Frame  Will need to Scheduled pt for a follow up appoinment with Dr Johney Frame to discuss ablation Dennis Bast, RN, BSN  September 12, 2010 12:09 PM

## 2010-11-01 NOTE — Assessment & Plan Note (Signed)
Summary: 3 month rov/sl   Visit Type:  3 mo f/u Primary Provider:  Dr. Pete Glatter  CC:  pt states her heart is back out of rhythm since  cardioversion 09/11/10....sob.  History of Present Illness: Anne Hayes comes in today for followup of her refractory paroxysmal atrial fibrillation.  She underwent TEE cardioversion December 12. She went back into it the next day. She has been ever since.  She has a significant bradycardic component where she gets down into the 40s after cardioversion. We'll avoid beta blockers. We also aborted AV nodal blocking calcium channel blockers.  She is currently on amiodarone 200 mg a day. She remains on Coumadin.  She is very fatigued and has dyspnea on exertion. She very much wants to consider an ablation with Dr. Johney Frame.  Current Medications (verified): 1)  Coumadin 5 Mg  Tabs (Warfarin Sodium) .... Use As Directed 2)  Aspirin Adult Low Strength 81 Mg Tbec (Aspirin) .[name].. 1 Tab Once Daily 3)  Amlodipine Besylate 5 Mg  Tabs (Amlodipine Besylate) .... 1/2 Tab Once Daily 4)  Pravastatin Sodium 80 Mg  Tabs (Pravastatin Sodium) .... Take 1 Tablet By Mouth Once A Day 5)  Zetia 10 Mg  Tabs (Ezetimibe) .... Take 1 Tablet By Mouth Once A Day 6)  Fish Oil 1000 Mg  Caps (Omega-3 Fatty Acids) .... Take 1 Cap Once Daily 7)  Cvs Folic Acid 400 Mcg  Tabs (Folic Acid) .... Take 1 Tablet By Mouth Once A Day 8)  Vitamin D 1000 Unit  Tabs (Cholecalciferol) .... Take 1 Tablet By Mouth Once A Day 9)  Magic Mouthwash .... 1 Tsp Gargle & Swallow, As Needed 10)  Calcium-Vitamin D 500-200 Mg-Unit Tabs (Calcium Carbonate-Vitamin D) .[name].. 1 Tab Once Daily 11)  Omeprazole 40 Mg Cpdr (Omeprazole) .[name].. 1 Tab Once Daily 12)  Amiodarone Hcl 200 Mg Tabs (Amiodarone Hcl) .[name].. 1 Tab  Daily  Allergies: 1)  ! Penicillin 2)  ! 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  Alcohol Use - yes Retired   Risk Factors: Smoking Status: quit (12/20/2008)  Review of Systems       negative other history present illness  Vital Signs:  Patient profile:   77 year old female Height:      66.5 inches Weight:      155.50 pounds Pulse rate:   106 / minute Pulse rhythm:   irregular BP sitting:   120 / 58  (left arm) Cuff size:   large  Vitals Entered By: Danielle Rankin, CMA (October 15, 2010 3:52 PM)  Physical Exam  General:  Well developed, well nourished, in no acute distress. Head:  normocephalic and atraumatic Eyes:  PERRLA/EOM intact; conjunctiva and lids normal. Neck:  Neck supple, no JVD. No masses, thyromegaly or abnormal cervical nodes. Lungs:  Clear bilaterally to auscultation and percussion. Heart:  irregular rate and rhythm, variable S1-S2, no carotid bruits Msk:  Back normal, normal gait. Muscle strength and tone normal. Pulses:  pulses normal in all 4 extremities Extremities:  No clubbing or cyanosis. Neurologic:  Alert and oriented x 3. Skin:  Intact without lesions or rashes. Psych:  Normal affect.   Impression & Recommendations:  Problem # 1:  ATRIAL FIBRILLATION (ICD-427.31) Assessment Unchanged Will increase amiodarone to 400 mg a day. Appointment scheduled with Dr. Johney Frame. Her updated medication list for this problem  includes:    Coumadin 5 Mg Tabs (Warfarin sodium) ..... Use as directed    Aspirin Adult Low Strength 81 Mg Tbec (Aspirin) .[name]... 1 tab once daily    Amiodarone Hcl 200 Mg Tabs (Amiodarone hcl) .[name]... Take two tablets by mouth daily  Orders: EKG w/ Interpretation (93000)  Problem # 2:  SINUS BRADYCARDIA (ICD-427.81) Assessment: Unchanged  Her updated medication list for this problem includes:    Coumadin 5 Mg Tabs (Warfarin sodium) ..... Use as directed    Aspirin Adult Low Strength 81 Mg Tbec (Aspirin) .[name]... 1 tab once daily    Amlodipine Besylate 5 Mg Tabs (Amlodipine besylate) .[name]... 1/2 tab once daily    Amiodarone Hcl 200 Mg Tabs (Amiodarone hcl) .[name]... Take two tablets by mouth daily  Problem # 3:  COUMADIN THERAPY (ICD-V58.61) Assessment: Unchanged  Patient Instructions: 1)  Your physician recommends that you schedule a follow-up appointment in: Keep scheduled appointment with Dr. Johney Frame on October 17, 2010 2)  Your physician has recommended you make the following change in your medication: Increase amiodarone to 400 mg by mouth daily (this is 2 of the 200 mg tablets)

## 2010-11-01 NOTE — Medication Information (Signed)
Summary: rov/tm  Anticoagulant Therapy  Managed by: Bethena Midget, RN, BSN Referring MD: Valera Castle MD PCP: Dr. Pete Glatter Supervising MD: Excell Seltzer MD, Casimiro Needle Indication 1: Atrial Fibrillation (ICD-427.31) Lab Used: [facility] Site: [facility] INR POC 3.0 INR RANGE 2 - 3  Dietary changes: no    Health status changes: no    Bleeding/hemorrhagic complications: no    Recent/future hospitalizations: no    Any changes in medication regimen? no    Recent/future dental: no  Any missed doses?: no       Is patient compliant with meds? yes       Allergies: 1)  ! Penicillin 2)  ! Codeine  Anticoagulation Management History:      The patient is taking warfarin and comes in today for a routine follow up visit.  Positive risk factors for bleeding include an age of 2 years or older.  The bleeding index is 'intermediate risk'.  Positive CHADS2 values include History of HTN and Age > 75 years old.  The start date was 05/07/2005.  Her last INR was 0.94 and today's INR is 3.0.  Anticoagulation responsible provider: Excell Seltzer MD, Casimiro Needle.  INR POC: 3.0.  Cuvette Lot#: [id number].  Exp: 11/2011.    Anticoagulation Management Assessment/Plan:      The patient's current anticoagulation dose is Coumadin 5 mg  tabs: use as directed.  The target INR is 2 - 3.  The next INR is due 10/22/2010.  Anticoagulation instructions were given to patient.  Results were reviewed/authorized by Bethena Midget, RN, BSN.  She was notified by Linward Headland, PharmD candidate.         Prior Anticoagulation Instructions: INR 5.2 Skip today and Fridays dose. Then start on Saturday taking 1 pill everyday. Recheck in one week.   Current Anticoagulation Instructions: INR 3.0 (INR goal: 2-3)  Take 1 tablet every day except 1/2 tablet on Mondays.  Recheck in 2 weeks.

## 2010-11-01 NOTE — Progress Notes (Signed)
Summary: c/o heart out of rhythm  Phone Note Call from Patient Call back at Home Phone [phone]   Caller: Patient Reason for Call: Talk to Nurse Summary of Call: c/o heart out of rhythm again pt aware that Dr. Johney Frame is not in the office this week. Initial call taken by: Lorne Skeens,  September 13, 2010 12:03 PM  Follow-up for Phone Call        lmom for patient that she really needs to set up an appontment to see Dr Johney Frame.  She can always go to the hospital if she is feeling really bad.  However at her last visit prior to her DCCV her heart rate was controlled.  I discussed with DrAlled and he said to bring her in at his next avaliable office slot.  I have aske patient to call back and schedule appointment Dennis Bast, RN, BSN  September 13, 2010 1:22 PM

## 2010-11-01 NOTE — Assessment & Plan Note (Signed)
Summary: rov/mj   Visit Type:  Follow-up Primary Provider:  Dr. Pete Glatter  CC:  a fib?[name]  History of Present Illness: The patient presents today for routine electrophysiology followup. She continues to struggle with symptoms of afib.  She describes extreme fatigue, decreased exercise tolerance, and palpitations. The patient denies symptoms of chest pain, shortness of breath, orthopnea, PND, lower extremity edema, dizziness, presyncope, syncope, or neurologic sequela. The patient is tolerating medications without difficulties and is otherwise without complaint today.  She saw Dr Daleen Squibb earlier this week and her amiodarone was increased to 400mg  daily.  She states that prior to this am, she was persistently in afib.  Today, she has converted to sinus.  Current Medications (verified): 1)  Coumadin 5 Mg  Tabs (Warfarin Sodium) .... Use As Directed 2)  Aspirin Adult Low Strength 81 Mg Tbec (Aspirin) .[name].. 1 Tab Once Daily 3)  Amlodipine Besylate 5 Mg  Tabs (Amlodipine Besylate) .... Take One Daily 4)  Pravastatin Sodium 80 Mg  Tabs (Pravastatin Sodium) .... Take 1 Tablet By Mouth Once A Day 5)  Zetia 10 Mg  Tabs (Ezetimibe) .... Take 1 Tablet By Mouth Once A Day 6)  Fish Oil 1000 Mg  Caps (Omega-3 Fatty Acids) .... Take 1 Cap Once Daily 7)  Cvs Folic Acid 400 Mcg  Tabs (Folic Acid) .... Take 1 Tablet By Mouth Once A Day 8)  Vitamin D 1000 Unit  Tabs (Cholecalciferol) .... Take 1 Tablet By Mouth Once A Day 9)  Magic Mouthwash .... 1 Tsp Gargle & Swallow, As Needed 10)  Calcium-Vitamin D 500-200 Mg-Unit Tabs (Calcium Carbonate-Vitamin D) .[name].. 1 Tab Once Daily 11)  Omeprazole 40 Mg Cpdr (Omeprazole) .[name].. 1 Tab Once Daily 12)  Amiodarone Hcl 200 Mg Tabs (Amiodarone Hcl) .... Take Two Tablets By Mouth Daily  Allergies (verified): 1)  ! Penicillin 2)  ! Codeine  Past History:  Past Medical History: Reviewed history from 09/07/2010 and no changes required. Hx of COUGH (ICD-786.2) HYPERTENSION  (ICD-401.9) PERSISTENT ATRIAL FIBRILLATION (ICD-427.31) CEREBROVASCULAR DISEASE (ICD-437.9) HYPERCHOLESTEROLEMIA (ICD-272.0) GERD (ICD-530.81) CONSTIPATION (ICD-564.00) UNSPECIFIED DISORDER OF KIDNEY AND URETER (ICD-593.9) RENAL CELL CANCER (ICD-189.0) DEGENERATIVE JOINT DISEASE (ICD-715.90) ANXIETY DISORDER, GENERALIZED (ICD-300.02)  Past Surgical History: Reviewed history from 11/15/2009 and no changes required. S/P appendectomy S/P hysterectomy in 1977 S/P cholecystectomy in 1973 S/P bilat bunionectomies S/P AP repair & Raz urethropexy in 1989  Family History: Reviewed history from 12/20/2008 and no changes required. non contributory  Social History: Reviewed history from 06/16/2009 and no changes required. Widowed  Tobacco Use - Former.  Alcohol Use - yes Retired   Review of Systems       All systems are reviewed and negative except as listed in the HPI.   Vital Signs:  Patient profile:   77 year old female Height:      66.5 inches Weight:      157 pounds Pulse rate:   60 / minute BP sitting:   122 / 78  (left arm)  Vitals Entered By: Jacquelin Hawking, CMA (October 17, 2010 10:04 AM)  Physical Exam  General:  Well developed, well nourished, in no acute distress. Head:  normocephalic and atraumatic Eyes:  PERRLA/EOM intact; conjunctiva and lids normal. Mouth:  Teeth, gums and palate normal. Oral mucosa normal. Neck:  Neck supple, no JVD. No masses, thyromegaly or abnormal cervical nodes. Lungs:  Clear bilaterally to auscultation and percussion. Heart:  RRR, no m/r/g Abdomen:  Bowel sounds positive; abdomen soft and non-tender without masses,  organomegaly, or hernias noted. No hepatosplenomegaly. Msk:  Back normal, normal gait. Muscle strength and tone normal. Extremities:  No clubbing or cyanosis. Neurologic:  Alert and oriented x 3. Skin:  Intact without lesions or rashes. Psych:  Normal affect.     Echocardiogram  Procedure date:   06/22/2010  Findings:       Study Conclusions    - Left ventricle: The cavity size was normal. Wall thickness was     increased in a pattern of mild LVH. The estimated ejection     fraction was 60%. Images were inadequate for LV wall motion     assessment.   - Right ventricle: The cavity size was mildly dilated. Systolic     function was normal.   Transthoracic echocardiography. M-mode, complete 2D, spectral   Doppler, and color Doppler. Height: Height: 167.6cm. Height: 66in.   Weight: Weight: 68kg. Weight: 149.6lb. Body mass index: BMI:   24.2kg/m^2. Body surface area: BSA: 1.26m^2. Blood pressure: 134/90.   Patient status: Inpatient. Location: Bedside.     EKG  Procedure date:  10/17/2010  Findings:      sinus bradycardia 56 bpm, PR 204, Qtc 459  EKG  Procedure date:  10/17/2010  Findings:      sinus bradcardia 56, PR 204, Qtc 459  Impression & Recommendations:  Problem # 1:  ATRIAL FIBRILLATION (ICD-427.31) The patient has symptomic persistant afib.  She has failed medical therapy with flecainide, sotalol, and amiodarone. Therapeutic strategies for afib including medicine and ablation were discussed in detail with the patient today. Risk, benefits, and alternatives to EP study and radiofrequency ablation for afib were also discussed in detail today. These risks include but are not limited to stroke, bleeding, vascular damage, tamponade, perforation, damage to the esophagus, lungs, and other structures, pulmonary vein stenosis, worsening renal function, and death. The patient understands these risk and wishes to proceed. We will therefore schedule afib ablation at the next available time.  Problem # 2:  SINUS BRADYCARDIA (ICD-427.81) not symptomatic no changes today  Problem # 3:  HYPERTENSION (ICD-401.9) stable  Other Orders: EKG w/ Interpretation (93000)  Patient Instructions: 1)  Your physician has recommended that you have an ablation.  Catheter ablation is a  medical procedure used to treat some cardiac arrhythmias (irregular heartbeats). During catheter ablation, a long, thin, flexible tube is put into a blood vessel in your groin (upper thigh), or neck. This tube is called an ablation catheter. It is then guided to your heart through the blood vessel. Radiofrequency waves destroy small areas of heart tissue where abnormal heartbeats may cause an arrhythmia to start.  Please see the instruction sheet given to you today 2)  Sceduled for 11/27/10

## 2010-11-07 ENCOUNTER — Encounter (INDEPENDENT_AMBULATORY_CARE_PROVIDER_SITE_OTHER): Payer: Medicare Other

## 2010-11-07 ENCOUNTER — Encounter: Payer: Self-pay | Admitting: Internal Medicine

## 2010-11-07 DIAGNOSIS — Z7901 Long term (current) use of anticoagulants: Secondary | ICD-10-CM

## 2010-11-07 DIAGNOSIS — I4891 Unspecified atrial fibrillation: Secondary | ICD-10-CM

## 2010-11-07 HISTORY — PX: ABLATION: SHX5711

## 2010-11-07 NOTE — Medication Information (Signed)
Summary: rov/tm  Anticoagulant Therapy  Managed by: Louann Sjogren, PharmD Referring MD: Valera Castle MD PCP: Dr. Pete Glatter Supervising MD: Antoine Poche MD,Joandry Slagter Indication 1: Atrial Fibrillation (ICD-427.31) Lab Used: [facility] Site: [facility] INR POC 2.8 INR RANGE 2 - 3  Dietary changes: no    Health status changes: no    Bleeding/hemorrhagic complications: no    Recent/future hospitalizations: no    Any changes in medication regimen? no    Recent/future dental: no  Any missed doses?: no       Is patient compliant with meds? yes       Allergies: 1)  ! Penicillin 2)  ! Codeine  Anticoagulation Management History:      The patient is taking warfarin and comes in today for a routine follow up visit.  Positive risk factors for bleeding include an age of 75 years or older.  The bleeding index is 'intermediate risk'.  Positive CHADS2 values include History of HTN and Age > 60 years old.  The start date was 05/07/2005.  Her last INR was 3.0 and today's INR is 2.8.  Anticoagulation responsible provider: Zakhi Dupre MD,Kristyana Notte.  INR POC: 2.8.  Cuvette Lot#: [id number].  Exp: 10/2011.    Anticoagulation Management Assessment/Plan:      The patient's current anticoagulation dose is Coumadin 5 mg  tabs: use as directed.  The target INR is 2 - 3.  The next INR is due 11/05/2010.  Anticoagulation instructions were given to patient.  Results were reviewed/authorized by Louann Sjogren, PharmD.  She was notified by Louann Sjogren PharmD.         Prior Anticoagulation Instructions: INR 3.8 Skip Tuesday's dose then change dose to 1 pill everyday except 1/2 pill on Mondays and Fridays. Recheck in one week.   Current Anticoagulation Instructions: INR 2.8 (goal 2-3)  Continue taking 1 tablet everyday except take 1/2 tablet on Mondays and Fridays.  Recheck in 1 week.  Ablation procedure on February 28th.

## 2010-11-13 ENCOUNTER — Encounter: Payer: Self-pay | Admitting: Internal Medicine

## 2010-11-14 ENCOUNTER — Encounter (INDEPENDENT_AMBULATORY_CARE_PROVIDER_SITE_OTHER): Payer: Medicare Other

## 2010-11-14 ENCOUNTER — Encounter: Payer: Self-pay | Admitting: Internal Medicine

## 2010-11-14 DIAGNOSIS — Z7901 Long term (current) use of anticoagulants: Secondary | ICD-10-CM

## 2010-11-14 DIAGNOSIS — I4891 Unspecified atrial fibrillation: Secondary | ICD-10-CM

## 2010-11-14 LAB — CONVERTED CEMR LAB: POC INR: 2.2

## 2010-11-15 NOTE — Medication Information (Signed)
Summary: [facility]  Anticoagulant Therapy  Managed by: Windell Hummingbird, RN Referring MD: Valera Castle MD PCP: Dr. Salome Holmes MD: Graciela Husbands MD, Viviann Spare Indication 1: Atrial Fibrillation (ICD-427.31) Lab Used: [facility]  Site: [facility] INR POC 3.0 INR RANGE 2 - 3  Dietary changes: no    Health status changes: no    Bleeding/hemorrhagic complications: no    Recent/future hospitalizations: yes       Details: 11/27/2010  Any changes in medication regimen? no    Recent/future dental: no  Any missed doses?: no       Is patient compliant with meds? yes       Allergies: 1)  ! Penicillin 2)  ! Codeine  Anticoagulation Management History:      The patient is taking warfarin and comes in today for a routine follow up visit.  Positive risk factors for bleeding include an age of 43 years or older.  The bleeding index is 'intermediate risk'.  Positive CHADS2 values include History of HTN and Age > 25 years old.  The start date was 05/07/2005.  Her last INR was 2.8.  Anticoagulation responsible provider: Graciela Husbands MD, Viviann Spare.  INR POC: 3.0.  Cuvette Lot#: [id number].  Exp: 10/2011.    Anticoagulation Management Assessment/Plan:      The patient's current anticoagulation dose is Coumadin 5 mg  tabs: use as directed.  The target INR is 2 - 3.  The next INR is due 11/14/2010.  Anticoagulation instructions were given to patient.  Results were reviewed/authorized by Windell Hummingbird, RN.  She was notified by Windell Hummingbird, RN.         Prior Anticoagulation Instructions: INR 2.8 (goal 2-3)  Continue taking 1 tablet everyday except take 1/2 tablet on Mondays and Fridays.  Recheck in 1 week.  Ablation procedure on February 28th.  Current Anticoagulation Instructions: INR 3.0 Continue taking 1 tablet every day, except take 1/2 tablet Mondays and Fridays. Recheck 1 week.

## 2010-11-20 ENCOUNTER — Encounter: Payer: Self-pay | Admitting: Internal Medicine

## 2010-11-20 ENCOUNTER — Other Ambulatory Visit: Payer: Self-pay | Admitting: Internal Medicine

## 2010-11-20 ENCOUNTER — Encounter: Payer: Self-pay | Admitting: Cardiovascular Disease

## 2010-11-20 ENCOUNTER — Other Ambulatory Visit (INDEPENDENT_AMBULATORY_CARE_PROVIDER_SITE_OTHER): Payer: Medicare Other

## 2010-11-20 ENCOUNTER — Encounter (INDEPENDENT_AMBULATORY_CARE_PROVIDER_SITE_OTHER): Payer: Medicare Other

## 2010-11-20 DIAGNOSIS — I4891 Unspecified atrial fibrillation: Secondary | ICD-10-CM

## 2010-11-20 DIAGNOSIS — Z7901 Long term (current) use of anticoagulants: Secondary | ICD-10-CM

## 2010-11-20 LAB — CBC WITH DIFFERENTIAL/PLATELET
Basophils Absolute: 0 10*3/uL (ref 0.0–0.1)
HCT: 42.3 % (ref 36.0–46.0)
Hemoglobin: 14.2 g/dL (ref 12.0–15.0)
Lymphs Abs: 0.9 10*3/uL (ref 0.7–4.0)
MCHC: 33.6 g/dL (ref 30.0–36.0)
Monocytes Relative: 8.9 % (ref 3.0–12.0)
Neutro Abs: 6.4 10*3/uL (ref 1.4–7.7)
RDW: 14.1 % (ref 11.5–14.6)

## 2010-11-20 LAB — CONVERTED CEMR LAB: POC INR: 2.9

## 2010-11-20 LAB — BASIC METABOLIC PANEL
CO2: 30 mEq/L (ref 19–32)
GFR: 53.63 mL/min — ABNORMAL LOW (ref >60.00)
Glucose, Bld: 78 mg/dL (ref 70–99)
Potassium: 4.4 mEq/L (ref 3.5–5.1)
Sodium: 142 mEq/L (ref 135–145)

## 2010-11-20 LAB — APTT: aPTT: 39.9 s — ABNORMAL HIGH (ref 21.7–28.8)

## 2010-11-21 NOTE — Medication Information (Signed)
Summary: [facility]  Anticoagulant Therapy  Managed by: Weston Brass, PharmD Referring MD: Valera Castle MD PCP: Dr. Pete Glatter Supervising MD: Gala Romney MD, Reuel Boom Indication 1: Atrial Fibrillation (ICD-427.31) Lab Used: [facility]  Site: [facility] INR POC 2.2 INR RANGE 2 - 3  Dietary changes: no    Health status changes: no    Bleeding/hemorrhagic complications: no    Recent/future hospitalizations: yes       Details: ablation on 11/27/10  Any changes in medication regimen? no    Recent/future dental: no  Any missed doses?: no       Is patient compliant with meds? yes       Allergies: 1)  ! Penicillin 2)  ! Codeine  Anticoagulation Management History:      The patient is taking warfarin and comes in today for a routine follow up visit.  Positive risk factors for bleeding include an age of 75 years or older.  The bleeding index is 'intermediate risk'.  Positive CHADS2 values include History of HTN and Age > 15 years old.  The start date was 05/07/2005.  Her last INR was 2.8.  Anticoagulation responsible provider: Bensimhon MD, Reuel Boom.  INR POC: 2.2.  Exp: 10/2011.    Anticoagulation Management Assessment/Plan:      The patient's current anticoagulation dose is Coumadin 5 mg  tabs: use as directed.  The target INR is 2 - 3.  The next INR is due 11/21/2010.  Anticoagulation instructions were given to patient.  Results were reviewed/authorized by Weston Brass, PharmD.  She was notified by Margot Chimes PharmD Candidate.         Prior Anticoagulation Instructions: INR 3.0 Continue taking 1 tablet every day, except take 1/2 tablet Mondays and Fridays. Recheck 1 week.  Current Anticoagulation Instructions: INR 2.2  Continue to take 1 tablet everyday except on Mondays and Fridays when you take 1/2 tablet.  Recheck INR in 1 week.

## 2010-11-21 NOTE — Letter (Signed)
Summary: ELectrophysiology/Ablation Procedure Instructions  [facility], Main Office  [address]   [place], [place] [address]   Phone: [phone]  Fax: [phone]     Electrophysiology/Ablation Procedure Instructions    You are scheduled for a(n) afib ablation on 11/27/10 at 7:30am with Dr. Johney Frame.  1.  Please come to the Short Stay Center at [facility] at 5:30am on the day of your procedure.  2.  Come prepared to stay overnight.   Please bring your insurance cards and a list of your medications.  3.  Come to the [facility] office on 11/20/10 for lab work.  .  You do not have to be fasting.  4.  Do not have anything to eat or drink after midnight the night before your procedure.  5.  All of your  medications may be taken with a small amount of water.  6.  Educational material received:  _ Ablation   * Occasionally, EP studies and ablations can become lengthy.  Please make your family aware of this before your procedure starts.  Average time ranges from 2-8 hours for EP studies/ablations.  Your physician will locate your family after the procedure with the results.  * If you have any questions after you get home, please call the office at [phone]. Anselm Pancoast  TEE--11/26/10 check in at Short Stay at 10:00am  Nothing to eat or drink after midnight  Procdure scheduled for 11:00am

## 2010-11-23 ENCOUNTER — Telehealth: Payer: Self-pay | Admitting: Internal Medicine

## 2010-11-26 ENCOUNTER — Ambulatory Visit (HOSPITAL_COMMUNITY)
Admission: RE | Admit: 2010-11-26 | Discharge: 2010-11-26 | Disposition: A | Discharge status: Home / Self Care | Payer: Medicare Other | Source: Ambulatory Visit | Attending: Internal Medicine | Admitting: Internal Medicine

## 2010-11-26 ENCOUNTER — Encounter: Payer: Self-pay | Admitting: Internal Medicine

## 2010-11-26 DIAGNOSIS — I4891 Unspecified atrial fibrillation: Secondary | ICD-10-CM

## 2010-11-27 ENCOUNTER — Ambulatory Visit (HOSPITAL_COMMUNITY)
Admission: RE | Admit: 2010-11-27 | Discharge: 2010-11-28 | DRG: 251 | Disposition: A | Discharge status: Home / Self Care | Payer: Medicare Other | Source: Ambulatory Visit | Attending: Internal Medicine | Admitting: Internal Medicine

## 2010-11-27 ENCOUNTER — Ambulatory Visit (HOSPITAL_COMMUNITY): Admission: RE | Admit: 2010-11-27 | Payer: Self-pay | Source: Home / Self Care | Admitting: Internal Medicine

## 2010-11-27 DIAGNOSIS — J45909 Unspecified asthma, uncomplicated: Secondary | ICD-10-CM | POA: Insufficient documentation

## 2010-11-27 DIAGNOSIS — M545 Low back pain, unspecified: Secondary | ICD-10-CM | POA: Insufficient documentation

## 2010-11-27 DIAGNOSIS — F3289 Other specified depressive episodes: Secondary | ICD-10-CM | POA: Insufficient documentation

## 2010-11-27 DIAGNOSIS — I498 Other specified cardiac arrhythmias: Secondary | ICD-10-CM | POA: Insufficient documentation

## 2010-11-27 DIAGNOSIS — F329 Major depressive disorder, single episode, unspecified: Secondary | ICD-10-CM | POA: Insufficient documentation

## 2010-11-27 DIAGNOSIS — F411 Generalized anxiety disorder: Secondary | ICD-10-CM | POA: Insufficient documentation

## 2010-11-27 DIAGNOSIS — K219 Gastro-esophageal reflux disease without esophagitis: Secondary | ICD-10-CM | POA: Insufficient documentation

## 2010-11-27 DIAGNOSIS — I4891 Unspecified atrial fibrillation: Secondary | ICD-10-CM | POA: Insufficient documentation

## 2010-11-27 DIAGNOSIS — K589 Irritable bowel syndrome without diarrhea: Secondary | ICD-10-CM | POA: Insufficient documentation

## 2010-11-27 DIAGNOSIS — I10 Essential (primary) hypertension: Secondary | ICD-10-CM | POA: Insufficient documentation

## 2010-11-27 DIAGNOSIS — G8929 Other chronic pain: Secondary | ICD-10-CM | POA: Insufficient documentation

## 2010-11-27 DIAGNOSIS — E785 Hyperlipidemia, unspecified: Secondary | ICD-10-CM | POA: Insufficient documentation

## 2010-11-27 DIAGNOSIS — M1990 Unspecified osteoarthritis, unspecified site: Secondary | ICD-10-CM | POA: Insufficient documentation

## 2010-11-27 LAB — MRSA PCR SCREENING: MRSA by PCR: NEGATIVE

## 2010-11-27 LAB — POCT ACTIVATED CLOTTING TIME: Activated Clotting Time: 164 seconds

## 2010-11-27 LAB — PROTIME-INR
INR: 2.36 — ABNORMAL HIGH (ref 0.00–1.49)
Prothrombin Time: 25.9 seconds — ABNORMAL HIGH (ref 11.6–15.2)

## 2010-11-27 NOTE — Medication Information (Signed)
Summary: rov/cb  Anticoagulant Therapy  Managed by: Weston Brass, PharmD Referring MD: Valera Castle MD PCP: Dr. Pete Glatter Supervising MD: Excell Seltzer MD, Casimiro Needle Indication 1: Atrial Fibrillation (ICD-427.31) Lab Used: [facility] Site: [facility] INR POC 2.9 INR RANGE 2 - 3  Dietary changes: no    Health status changes: no    Bleeding/hemorrhagic complications: no    Recent/future hospitalizations: yes       Details: next monday she is having a TEE and on Tuesday she is having an ablation.    Any changes in medication regimen? no    Recent/future dental: no  Any missed doses?: no       Is patient compliant with meds? yes       Allergies: 1)  ! Penicillin 2)  ! Codeine  Anticoagulation Management History:      The patient is taking warfarin and comes in today for a routine follow up visit.  Positive risk factors for bleeding include an age of 75 years or older.  The bleeding index is 'intermediate risk'.  Positive CHADS2 values include History of HTN and Age > 50 years old.  The start date was 05/07/2005.  Her last INR was 2.8.  Anticoagulation responsible provider: Excell Seltzer MD, Casimiro Needle.  INR POC: 2.9.  Cuvette Lot#: [id number].  Exp: 10/2011.    Anticoagulation Management Assessment/Plan:      The patient's current anticoagulation dose is Coumadin 5 mg  tabs: use as directed.  The target INR is 2 - 3.  The next INR is due 12/05/2010.  Anticoagulation instructions were given to patient.  Results were reviewed/authorized by Weston Brass, PharmD.  She was notified by Margot Chimes PharmD Candidate.         Prior Anticoagulation Instructions: INR 2.2  Continue to take 1 tablet everyday except on Mondays and Fridays when you take 1/2 tablet.  Recheck INR in 1 week.   Current Anticoagulation Instructions: INR 2.9  Continue to take 1 tablet everyday except for on Mondays and Fridays when you only take 1/2 tablet.  Recheck INR in 7-10 days after ablation

## 2010-11-27 NOTE — Progress Notes (Signed)
Summary: QUESTIONS ABOUT STOP COUMADIN DUE TO SURGERY  Phone Note Call from Patient Call back at Home Phone [phone]   Caller: Patient Summary of Call: PT HAVING NEXT TUESDAY AND WANT TO KNOW IF SHE NEEDS TO STOP COUMADIN Initial call taken by: Judie Grieve,  November 23, 2010 9:49 AM  Follow-up for Phone Call        RN s/w Pt: reassured her that she did not need to stop coumadin for ablation. Pt stated daughter wanted to verify.  Follow-up by: Bernita Raisin, RN, BSN,  November 23, 2010 10:04 AM

## 2010-11-28 DIAGNOSIS — I4891 Unspecified atrial fibrillation: Secondary | ICD-10-CM

## 2010-11-28 LAB — POCT ACTIVATED CLOTTING TIME
Activated Clotting Time: 246 seconds
Activated Clotting Time: 293 seconds
Activated Clotting Time: 328 seconds

## 2010-12-06 ENCOUNTER — Emergency Department (HOSPITAL_COMMUNITY): Payer: Medicare Other

## 2010-12-06 ENCOUNTER — Emergency Department (HOSPITAL_COMMUNITY)
Admission: EM | Admit: 2010-12-06 | Discharge: 2010-12-06 | Disposition: A | Discharge status: Home / Self Care | Payer: Medicare Other | Attending: Emergency Medicine | Admitting: Emergency Medicine

## 2010-12-06 DIAGNOSIS — M25519 Pain in unspecified shoulder: Secondary | ICD-10-CM | POA: Insufficient documentation

## 2010-12-06 DIAGNOSIS — R079 Chest pain, unspecified: Secondary | ICD-10-CM | POA: Insufficient documentation

## 2010-12-06 DIAGNOSIS — Z79899 Other long term (current) drug therapy: Secondary | ICD-10-CM | POA: Insufficient documentation

## 2010-12-06 DIAGNOSIS — Z7901 Long term (current) use of anticoagulants: Secondary | ICD-10-CM | POA: Insufficient documentation

## 2010-12-06 DIAGNOSIS — I10 Essential (primary) hypertension: Secondary | ICD-10-CM | POA: Insufficient documentation

## 2010-12-07 ENCOUNTER — Encounter: Payer: Self-pay | Admitting: Cardiology

## 2010-12-07 ENCOUNTER — Encounter (INDEPENDENT_AMBULATORY_CARE_PROVIDER_SITE_OTHER): Payer: Medicare Other

## 2010-12-07 DIAGNOSIS — I4891 Unspecified atrial fibrillation: Secondary | ICD-10-CM

## 2010-12-07 DIAGNOSIS — Z7901 Long term (current) use of anticoagulants: Secondary | ICD-10-CM

## 2010-12-10 ENCOUNTER — Telehealth: Payer: Self-pay | Admitting: Internal Medicine

## 2010-12-10 LAB — PROTIME-INR
INR: 2.2 — ABNORMAL HIGH (ref 0.00–1.49)
Prothrombin Time: 24.6 seconds — ABNORMAL HIGH (ref 11.6–15.2)

## 2010-12-11 LAB — PROTIME-INR
INR: 1.89 — ABNORMAL HIGH (ref 0.00–1.49)
INR: 2.04 — ABNORMAL HIGH (ref 0.00–1.49)
INR: 2.39 — ABNORMAL HIGH (ref 0.00–1.49)
Prothrombin Time: 24.6 seconds — ABNORMAL HIGH (ref 11.6–15.2)
Prothrombin Time: 26.2 seconds — ABNORMAL HIGH (ref 11.6–15.2)

## 2010-12-11 LAB — COMPREHENSIVE METABOLIC PANEL
ALT: 18 U/L (ref 0–35)
AST: 24 U/L (ref 0–37)
Calcium: 9.6 mg/dL (ref 8.4–10.5)
Creatinine, Ser: 0.94 mg/dL (ref 0.4–1.2)
GFR calc Af Amer: >60 mL/min (ref >60)
GFR calc non Af Amer: 57 mL/min — ABNORMAL LOW (ref >60)
Glucose, Bld: 113 mg/dL — ABNORMAL HIGH (ref 70–99)
Sodium: 140 mEq/L (ref 135–145)
Total Protein: 7.1 g/dL (ref 6.0–8.3)

## 2010-12-11 LAB — MAGNESIUM: Magnesium: 2.2 mg/dL (ref 1.5–2.5)

## 2010-12-11 LAB — APTT: aPTT: 36 seconds (ref 24–37)

## 2010-12-11 LAB — CBC
MCH: 31.8 pg (ref 26.0–34.0)
MCHC: 33.6 g/dL (ref 30.0–36.0)
RDW: 12.5 % (ref 11.5–15.5)

## 2010-12-11 NOTE — Medication Information (Signed)
Summary: ccr. per pt call.gd  Anticoagulant Therapy  Managed by: Bethena Midget, RN, BSN Referring MD: Valera Castle MD PCP: Dr. Pete Glatter Supervising MD: Riley Kill MD, Maisie Fus Indication 1: Atrial Fibrillation (ICD-427.31) Lab Used: [facility] Site: [facility] INR POC 2.6 INR RANGE 2 - 3  Dietary changes: no    Health status changes: no    Bleeding/hemorrhagic complications: no    Recent/future hospitalizations: yes       Details: See note below  Any changes in medication regimen? yes       Details: Amiodarone dose decreased one week ago from BID to daily.   Recent/future dental: no  Any missed doses?: no       Is patient compliant with meds? yes      Comments: Went to ER last night because her Left shoulder down to elbow continued to hurt. All test and x-ray were negative.  Allergies: 1)  ! Penicillin 2)  ! Codeine  Anticoagulation Management History:      The patient is taking warfarin and comes in today for a routine follow up visit.  Positive risk factors for bleeding include an age of 75 years or older.  The bleeding index is 'intermediate risk'.  Positive CHADS2 values include History of HTN and Age > 75 years old.  The start date was 05/07/2005.  Her last INR was 3.0 ratio.  Anticoagulation responsible provider: Riley Kill MD, Maisie Fus.  INR POC: 2.6.  Cuvette Lot#: [id number].  Exp: 11/2011.    Anticoagulation Management Assessment/Plan:      The patient's current anticoagulation dose is Coumadin 5 mg  tabs: use as directed.  The target INR is 2 - 3.  The next INR is due 12/21/2010.  Anticoagulation instructions were given to patient.  Results were reviewed/authorized by Bethena Midget, RN, BSN.  She was notified by Bethena Midget, RN, BSN.         Prior Anticoagulation Instructions: INR 2.9  Continue to take 1 tablet everyday except for on Mondays and Fridays when you only take 1/2 tablet.  Recheck INR in 7-10 days after ablation   Current Anticoagulation Instructions: INR  2.6 Continue 1 pill everyday except 1/2 pill on Tuesdays and Thursdays. Recheck in 2 weeks.

## 2010-12-13 LAB — BASIC METABOLIC PANEL
BUN: 12 mg/dL (ref 6–23)
CO2: 29 mEq/L (ref 19–32)
Calcium: 9.6 mg/dL (ref 8.4–10.5)
Chloride: 108 mEq/L (ref 96–112)
GFR calc Af Amer: >60 mL/min (ref >60)
GFR calc non Af Amer: 54 mL/min — ABNORMAL LOW (ref >60)
GFR calc non Af Amer: 54 mL/min — ABNORMAL LOW (ref >60)
Glucose, Bld: 109 mg/dL — ABNORMAL HIGH (ref 70–99)
Glucose, Bld: 94 mg/dL (ref 70–99)
Potassium: 3.9 mEq/L (ref 3.5–5.1)
Potassium: 4.6 mEq/L (ref 3.5–5.1)
Sodium: 142 mEq/L (ref 135–145)
Sodium: 143 mEq/L (ref 135–145)

## 2010-12-13 LAB — CBC
HCT: 43.4 % (ref 36.0–46.0)
Hemoglobin: 14.3 g/dL (ref 12.0–15.0)
MCHC: 32.9 g/dL (ref 30.0–36.0)
RBC: 4.58 MIL/uL (ref 3.87–5.11)
WBC: 6 10*3/uL (ref 4.0–10.5)

## 2010-12-13 LAB — PROTIME-INR
INR: 1.43 (ref 0.00–1.49)
INR: 1.65 — ABNORMAL HIGH (ref 0.00–1.49)
INR: 1.65 — ABNORMAL HIGH (ref 0.00–1.49)
INR: 1.85 — ABNORMAL HIGH (ref 0.00–1.49)
Prothrombin Time: 17.6 seconds — ABNORMAL HIGH (ref 11.6–15.2)
Prothrombin Time: 18.9 seconds — ABNORMAL HIGH (ref 11.6–15.2)
Prothrombin Time: 19.7 seconds — ABNORMAL HIGH (ref 11.6–15.2)
Prothrombin Time: 21.5 seconds — ABNORMAL HIGH (ref 11.6–15.2)

## 2010-12-13 LAB — APTT: aPTT: 33 seconds (ref 24–37)

## 2010-12-13 LAB — TSH: TSH: 2.022 u[IU]/mL (ref 0.350–4.500)

## 2010-12-14 NOTE — Discharge Summary (Signed)
Anne Hayes, Anne Hayes                ACCOUNT NO.:  [id number]  MEDICAL RECORD NO.:  [id number]           PATIENT TYPE:  I  LOCATION:  2922                         FACILITY:  [facility]  PHYSICIAN:  Hillis Range, MD       DATE OF BIRTH:  09/22/1924  DATE OF ADMISSION:  11/27/2010 DATE OF DISCHARGE:  11/28/2010                              DISCHARGE SUMMARY   PRIMARY CARE PHYSICIAN:  Hal T. Stoneking, MD  PRIMARY CARDIOLOGIST:  Jesse Sans. Daleen Squibb, MD, [facility]  ELECTROPHYSIOLOGIST:  Hillis Range, MD  PRIMARY DIAGNOSIS:  Atrial fibrillation.  SECONDARY DIAGNOSIS: 1. Sinus bradycardia - asymptomatic. 2. Hypertension. 3. Hyperlipidemia. 4. Asthma. 5. Gastroesophageal reflux disease. 6. Irritable bowel syndrome. 7. Osteoarthritis. 8. Chronic low back pain. 9. Anxiety. 10.Depression.  ALLERGIES:  The patient is allergic to PENICILLIN, CODEINE, AND SULFA.  PROCEDURES THIS ADMISSION:  Electrophysiology study and radiofrequency catheter ablation for atrial fibrillation by Dr. Johney Frame on November 27, 2010.  This demonstrated sinus rhythm upon presentation, successful electrical isolation and anatomical encircling of all 4 pulmonary veins. The patient had no inducible arrhythmias following ablation, rest on and off Isuprel.  The patient had no early apparent complications.  BRIEF HISTORY OF PRESENT ILLNESS:  Anne Hayes is an 77 year old female with a history of paroxysmal atrial fibrillation.  She has failed medical  therapy with flecainide and amiodarone.  Treatment options for atrial fibrillation were discussed the patient by Dr. Johney Frame.  Risks, benefits, and alternatives radiofrequency catheter ablation were discussed and she wished to proceed.  HOSPITAL COURSE:  The patient was admitted on November 27, 2010, for planned ablation of atrial fibrillation.  She underwent transesophageal echocardiogram on November 26, 2010, which demonstrated a normal ejection fraction with no thrombus in the  left atrium or left atrial appendage.  The patient underwent ablation by Dr. Johney Frame with details as outlined above.  She was monitored on telemetry overnight, which demonstrated sinus bradycardia.  The patient's groin incisions were without hematoma or bruit.  Dr. Johney Frame examined the patient on November 28, 2010, and considered her stable for discharge.  FOLLOWUP APPOINTMENTS: 1. Dr. Johney Frame on Feb 20, 2011, at 9:15 a.m. 2. [facility] on December 05, 2010, at 3:15 p.m. 3. Dr. Daleen Squibb as scheduled. 4. Dr. Pete Glatter as scheduled.  DISCHARGE INSTRUCTIONS: 1. Increase activity slowly. 2. No driving for 2 days. 3. Follow a low-sodium, heart-healthy diet. 4. Keep groin incisions clean and dry.  DISCHARGE MEDICATIONS: 1. Warfarin as directed by [facility]. 2. Aspirin 81 mg daily. 3. Pravachol 80 mg daily. 4. Zetia 10 mg daily. 5. Fish oil 1000 mg daily. 6. Folic acid 0.4 mg daily. 7. Vitamin D3 daily. 8. Calcium/vitamin D daily. 9. Omeprazole 40 mg daily. 10.Artificial tears 1-2 drops in each eye daily. 11.Amiodarone 200 mg take 1 tablet daily-this is a     decreased dose for the patient. 12.Amlodipine 5 mg daily.  DISPOSITION:  The patient was seen and examined by Dr. Johney Frame on November 28, 2010, and considered stable for discharge.  DURATION OF DISCHARGE ENCOUNTER:  35 minutes.     Gypsy Balsam,  RN,BSN   ______________________________ Hillis Range, MD    AS/MEDQ  D:  11/28/2010  T:  11/28/2010  Job:  [id number]  cc:   Thomas C. Wall, MD, FACC Hal T. Stoneking, M.D.  Electronically Signed by Gypsy Balsam RNBSN on 12/04/2010 09:34:51 AM Electronically Signed by Hillis Range MD on 12/14/2010 10:52:46 PM

## 2010-12-14 NOTE — Op Note (Signed)
[name], [name]                ACCOUNT NO.:  [id number]  MEDICAL RECORD NO.:  [id number]           PATIENT TYPE:  I  LOCATION:  2922                         FACILITY:  [facility]  PHYSICIAN:  Hillis Range, MD       DATE OF BIRTH:  [birth year]  DATE OF PROCEDURE: DATE OF DISCHARGE:                              OPERATIVE REPORT   SURGEON:  Hillis Range, MD  PREPROCEDURE DIAGNOSIS:  Persistent atrial fibrillation.  POSTPROCEDURE DIAGNOSES:  Persistent atrial fibrillation.  PROCEDURES: 1. Comprehensive EP study. 2. Coronary sinus pacing and recording. 3. A 3-D mapping of SVT. 4. Radiofrequency ablation of SVT. 5. Arterial blood pressure monitoring. 6. Intracardiac echocardiography. 7. Transseptal puncture of an intact septum. 8. Pulmonary vein venography. 9. Isoproterenol infusion.  INTRODUCTION:  Anne Hayes is a pleasant 77 year old female with a history of persistent atrial fibrillation who presents today for EP study and radiofrequency ablation.  She has previously failed medical therapy with multiple antiarrhythmics including flecainide, sotalol, and amiodarone.  She therefore presents today for EP study and radiofrequency ablation.  DESCRIPTION OF THE PROCEDURE:  Informed written consent was obtained, and the patient was brought to the electrophysiology lab in the fasting state.  She was adequately sedated with intravenous medications as outlined in the anesthesia report.  The patient's right and left groins were prepped and draped in the usual sterile fashion by the EP lab staff.  Using a percutaneous Seldinger technique, one 6, one 7, and one 8-French hemostasis sheaths were placed in the right common femoral vein.  A 4-French hemostasis sheath was placed in the right common femoral artery for blood pressure monitoring.  An 11-French hemostasis sheath was placed in the left common femoral vein.  A 6-French decapolar Polaris X coronary sinus catheter was introduced  through the right common femoral vein and advanced into the coronary sinus for recording and pacing from this location.  A 6-French quadripolar Josephson catheter was introduced through the right common femoral vein and advanced into the right ventricle for recording and pacing.  This catheter was then pulled back to the His bundle location.  The patient presented to the electrophysiology lab in normal sinus rhythm.  Her PR interval was 211 msec with a QRS duration of 77 msec and a QT interval of 455 msec.  Her AH interval measured 127 msec with an HV interval of 54 msec.  Ventricular pacing was performed which revealed VA dissociation when pacing at a basic cycle length of 600 msec.  A 10- [place] [employer] AcuNav intracardiac echocardiography catheter was introduced through the left common femoral vein and advanced into the right atrium.  Intracardiac echocardiography was performed which revealed a moderate-sized left atrium.  The patient was noted to have four separate pulmonary veins which were moderate in size.  The middle right common femoral vein sheath was exchanged for an 8.5-French SL-2 transseptal sheath and transseptal access was achieved in a standard fashion using a Brockenbrough needle under biplane fluoroscopy with intracardiac echocardiography confirmation of the transseptal puncture. Once transseptal access had been achieved, heparin was administered intravenously and intra-arterially in order to maintain an  ACT of greater than 350 seconds throughout the procedure.  A 6-French multipurpose angiographic catheter with guidewire was introduced through the transseptal sheath and positioned over the mouth of all four pulmonary veins.  Pulmonary venograms were performed by hand injection of nonionic contrast and demonstrated moderate-sized pulmonary veins with no evidence of pulmonary vein stenosis.  The angiographic catheter was then removed.  The His bundle catheter  was removed and in its place a 3.5-mm Bios[employer] Webster EZ-Steer ThermaCool ablation catheter was advanced into the right atrium.  The transseptal sheath was pulled back into the IVC over a guidewire.  The ablation catheter was advanced across the transseptal hole using the wire as a guide.  The transseptal sheath was then re-advanced over the guidewire into the left atrium. The [employer] dual decapolar 20-mm circular mapping catheter was introduced through the transseptal sheath and positioned over the mouth of all four pulmonary veins.  Three-dimensional electroanatomical mapping was performed using [employer].  This demonstrated atrial activity within all four pulmonary veins at baseline.  The patient then underwent successful sequential electrical isolation and anatomical encircling of all four pulmonary veins using radiofrequency current with a circular mapping catheter as a guide.  Following ablation, isoproterenol was infused up to 10 mcg per minute.  The patient was observed to have occasional multifocal premature atrial contractions. During isoproterenol infusion, all four pulmonary veins were again evaluated and remained electrically silent.  Isoproterenol was therefore allowed to wash out.  During isoproterenol washout, atrial pacing was performed down to a cycle length of 250 msec with no arrhythmias observed.  The AV Wenckebach cycle length was 570 msec with no evidence of PR greater than RR.  The ablation catheter and circular catheter were then pulled back into the right atrium.  The circular mapping catheter was positioned at the junction of the superior vena cava and the right atrium.  The patient then underwent electrical isolation of the superior vena cava at the junction of the SVC and right atrium.  Prior to each ablation lesion, pacing was performed from the distal ablation electrode at maximum output to ensure that diaphragmatic stimulation was  not observed.  In addition during radiofrequency ablation, the diaphragm was evaluated with fluoroscopic visualization to confirm that no diaphragmatic injury was delivered.  A series of radiofrequency applications were delivered in this manner, creating a circular lesion set at the junction of the SVC and right atrium.  The patient was then observed without any further arrhythmias.  The procedure was therefore considered completed.  All catheters were removed and the sheaths were aspirated and flushed.  Intracardiac echocardiography confirmed that there was no pericardial effusion.  The patient was then transferred to the recovery area for sheath removal per protocol.  A limited bedside transthoracic echocardiogram confirmed no pericardial effusion.  There were no early apparent complications.  CONCLUSIONS: 1. Sinus rhythm upon presentation. 2. Successful electrical isolation and anatomical encircling of all     four pulmonary veins using radiofrequency current with a circular     mapping catheter as a guide. 3. No inducible arrhythmias following ablation both on and off     isoproterenol. 4. The superior vena cava was electrically encircled using     radiofrequency current. 5. No early apparent complications.     Hillis Range, MD JA/MEDQ  D:  11/27/2010  T:  11/28/2010  Job:  [id number]  cc:   Thomas C. Wall, MD, FACC Hal T. Stoneking, M.D.  Electronically Signed by Hillis Range MD  on 12/14/2010 10:52:50 PM

## 2010-12-18 NOTE — Progress Notes (Signed)
Summary: Have questions  Phone Note Call from Patient Call back at Home Phone [phone]   Caller: Patient Reason for Call: Talk to Nurse Summary of Call: Pt have few questions Initial call taken by: Judie Grieve,  December 10, 2010 2:50 PM  Follow-up for Phone Call        ok to go and walk on GXT at gym Dennis Bast, RN, BSN  December 10, 2010 4:32 PM

## 2010-12-21 ENCOUNTER — Ambulatory Visit (INDEPENDENT_AMBULATORY_CARE_PROVIDER_SITE_OTHER): Payer: Medicare Other | Admitting: *Deleted

## 2010-12-21 DIAGNOSIS — I4891 Unspecified atrial fibrillation: Secondary | ICD-10-CM

## 2010-12-21 DIAGNOSIS — Z7901 Long term (current) use of anticoagulants: Secondary | ICD-10-CM

## 2010-12-21 NOTE — Patient Instructions (Signed)
INR 4.0 Skip tonight's dose and take only 1/2 tablet tomorrow (Saturday.)  Then, resume taking 1 tablet (5 mg) daily, except take 1/2 tablet (2.5 mg) on Tuesdays and Thursdays. Recheck in 2 weeks.

## 2010-12-29 ENCOUNTER — Emergency Department (HOSPITAL_COMMUNITY): Payer: Medicare Other

## 2010-12-29 ENCOUNTER — Emergency Department (HOSPITAL_COMMUNITY)
Admission: EM | Admit: 2010-12-29 | Discharge: 2010-12-29 | Disposition: A | Discharge status: Home / Self Care | Payer: Medicare Other | Attending: Emergency Medicine | Admitting: Emergency Medicine

## 2010-12-29 DIAGNOSIS — J811 Chronic pulmonary edema: Secondary | ICD-10-CM | POA: Insufficient documentation

## 2010-12-29 DIAGNOSIS — R1013 Epigastric pain: Secondary | ICD-10-CM | POA: Insufficient documentation

## 2010-12-29 DIAGNOSIS — I10 Essential (primary) hypertension: Secondary | ICD-10-CM | POA: Insufficient documentation

## 2010-12-29 DIAGNOSIS — R112 Nausea with vomiting, unspecified: Secondary | ICD-10-CM | POA: Insufficient documentation

## 2010-12-29 DIAGNOSIS — J8410 Pulmonary fibrosis, unspecified: Secondary | ICD-10-CM | POA: Insufficient documentation

## 2010-12-29 DIAGNOSIS — E785 Hyperlipidemia, unspecified: Secondary | ICD-10-CM | POA: Insufficient documentation

## 2010-12-29 LAB — COMPREHENSIVE METABOLIC PANEL
ALT: 29 U/L (ref 0–35)
AST: 29 U/L (ref 0–37)
CO2: 25 mEq/L (ref 19–32)
Calcium: 8.8 mg/dL (ref 8.4–10.5)
Creatinine, Ser: 0.86 mg/dL (ref 0.4–1.2)
GFR calc Af Amer: >60 mL/min (ref >60)
GFR calc non Af Amer: >60 mL/min (ref >60)
Sodium: 136 mEq/L (ref 135–145)
Total Protein: 6.4 g/dL (ref 6.0–8.3)

## 2010-12-29 LAB — CBC
MCH: 30.5 pg (ref 26.0–34.0)
MCHC: 33.1 g/dL (ref 30.0–36.0)
Platelets: 184 10*3/uL (ref 150–400)
RDW: 13.7 % (ref 11.5–15.5)

## 2010-12-29 LAB — URINALYSIS, ROUTINE W REFLEX MICROSCOPIC
Hgb urine dipstick: NEGATIVE
Nitrite: NEGATIVE
Specific Gravity, Urine: 1.019 (ref 1.005–1.030)
Urobilinogen, UA: 0.2 mg/dL (ref 0.0–1.0)
pH: 5.5 (ref 5.0–8.0)

## 2010-12-29 LAB — URINE MICROSCOPIC-ADD ON

## 2010-12-29 LAB — DIFFERENTIAL
Basophils Absolute: 0 10*3/uL (ref 0.0–0.1)
Basophils Relative: 0 % (ref 0–1)
Eosinophils Absolute: 0.1 10*3/uL (ref 0.0–0.7)
Eosinophils Relative: 2 % (ref 0–5)
Monocytes Absolute: 1 10*3/uL (ref 0.1–1.0)
Monocytes Relative: 12 % (ref 3–12)
Neutro Abs: 6 10*3/uL (ref 1.7–7.7)

## 2010-12-30 LAB — URINE CULTURE
Colony Count: NO GROWTH
Culture: NO GROWTH

## 2010-12-31 ENCOUNTER — Telehealth: Payer: Self-pay | Admitting: *Deleted

## 2010-12-31 NOTE — Telephone Encounter (Signed)
Received call from [facility] at Dr. Laverle Hobby office. They saw pt today and checked PT and INR.  PT 47.9 and INR 4.0.  Pt was seen in Urgent Care over weekend and was started on Macrobid 100mg  and Doxycycline for Pneumonia and UTI.  Will forward note to Coumadin Clinic.

## 2011-01-01 NOTE — Telephone Encounter (Signed)
Saw Dr. Pete Glatter.  Diagnosed with PNA.  Given doxycycline.  Still has several days left.  Also on nitrofurantoin for UTI.  Has not been able to eat anything since we saw her last. Advised pt to hold Coumadin today and she has an appt to repeat INR on 4/5.

## 2011-01-02 LAB — CBC
HCT: 37.5 % (ref 36.0–46.0)
Hemoglobin: 12.7 g/dL (ref 12.0–15.0)
Hemoglobin: 13.6 g/dL (ref 12.0–15.0)
MCHC: 34 g/dL (ref 30.0–36.0)
MCV: 93.5 fL (ref 78.0–100.0)
Platelets: 129 10*3/uL — ABNORMAL LOW (ref 150–400)
Platelets: 152 10*3/uL (ref 150–400)
RBC: 4.27 MIL/uL (ref 3.87–5.11)
RDW: 12.6 % (ref 11.5–15.5)
RDW: 12.7 % (ref 11.5–15.5)

## 2011-01-02 LAB — CROSSMATCH

## 2011-01-02 LAB — BASIC METABOLIC PANEL
BUN: 11 mg/dL (ref 6–23)
Calcium: 9.4 mg/dL (ref 8.4–10.5)
Chloride: 105 mEq/L (ref 96–112)
GFR calc Af Amer: >60 mL/min (ref >60)
GFR calc non Af Amer: 54 mL/min — ABNORMAL LOW (ref >60)
GFR calc non Af Amer: 60 mL/min — ABNORMAL LOW (ref >60)
Glucose, Bld: 127 mg/dL — ABNORMAL HIGH (ref 70–99)
Potassium: 3.8 mEq/L (ref 3.5–5.1)
Potassium: 3.9 mEq/L (ref 3.5–5.1)
Sodium: 137 mEq/L (ref 135–145)
Sodium: 138 mEq/L (ref 135–145)

## 2011-01-02 LAB — PROTIME-INR: Prothrombin Time: 15.3 seconds — ABNORMAL HIGH (ref 11.6–15.2)

## 2011-01-03 ENCOUNTER — Ambulatory Visit (INDEPENDENT_AMBULATORY_CARE_PROVIDER_SITE_OTHER): Payer: Medicare Other | Admitting: *Deleted

## 2011-01-03 DIAGNOSIS — Z7901 Long term (current) use of anticoagulants: Secondary | ICD-10-CM

## 2011-01-03 DIAGNOSIS — I4891 Unspecified atrial fibrillation: Secondary | ICD-10-CM

## 2011-01-08 ENCOUNTER — Emergency Department (HOSPITAL_BASED_OUTPATIENT_CLINIC_OR_DEPARTMENT_OTHER)
Admission: EM | Admit: 2011-01-08 | Discharge: 2011-01-09 | Disposition: A | Discharge status: Home / Self Care | Payer: Medicare Other | Attending: Emergency Medicine | Admitting: Emergency Medicine

## 2011-01-08 ENCOUNTER — Emergency Department (INDEPENDENT_AMBULATORY_CARE_PROVIDER_SITE_OTHER): Payer: Medicare Other

## 2011-01-08 DIAGNOSIS — G319 Degenerative disease of nervous system, unspecified: Secondary | ICD-10-CM

## 2011-01-08 DIAGNOSIS — Z79899 Other long term (current) drug therapy: Secondary | ICD-10-CM | POA: Insufficient documentation

## 2011-01-08 DIAGNOSIS — R110 Nausea: Secondary | ICD-10-CM | POA: Insufficient documentation

## 2011-01-08 DIAGNOSIS — R51 Headache: Secondary | ICD-10-CM

## 2011-01-08 DIAGNOSIS — E785 Hyperlipidemia, unspecified: Secondary | ICD-10-CM | POA: Insufficient documentation

## 2011-01-08 LAB — URINALYSIS, ROUTINE W REFLEX MICROSCOPIC
Nitrite: NEGATIVE
Protein, ur: NEGATIVE mg/dL
Specific Gravity, Urine: 1.007 (ref 1.005–1.030)
Urobilinogen, UA: 0.2 mg/dL (ref 0.0–1.0)

## 2011-01-08 LAB — COMPREHENSIVE METABOLIC PANEL
AST: 36 U/L (ref 0–37)
BUN: 18 mg/dL (ref 6–23)
CO2: 27 mEq/L (ref 19–32)
Calcium: 9.4 mg/dL (ref 8.4–10.5)
Chloride: 106 mEq/L (ref 96–112)
Creatinine, Ser: 1 mg/dL (ref 0.4–1.2)
GFR calc Af Amer: >60 mL/min (ref >60)
GFR calc non Af Amer: 53 mL/min — ABNORMAL LOW (ref >60)
Total Bilirubin: 0.7 mg/dL (ref 0.3–1.2)

## 2011-01-08 LAB — CBC
Hemoglobin: 13 g/dL (ref 12.0–15.0)
MCH: 30 pg (ref 26.0–34.0)
MCHC: 32.8 g/dL (ref 30.0–36.0)
MCV: 91.5 fL (ref 78.0–100.0)
RBC: 4.33 MIL/uL (ref 3.87–5.11)

## 2011-01-08 LAB — LIPASE, BLOOD: Lipase: 76 U/L (ref 23–300)

## 2011-01-09 ENCOUNTER — Emergency Department (HOSPITAL_COMMUNITY)
Admission: EM | Admit: 2011-01-09 | Discharge: 2011-01-10 | Disposition: A | Discharge status: Home / Self Care | Payer: Medicare Other | Attending: Emergency Medicine | Admitting: Emergency Medicine

## 2011-01-09 ENCOUNTER — Emergency Department (HOSPITAL_COMMUNITY): Payer: Medicare Other

## 2011-01-09 DIAGNOSIS — E785 Hyperlipidemia, unspecified: Secondary | ICD-10-CM | POA: Insufficient documentation

## 2011-01-09 DIAGNOSIS — R51 Headache: Secondary | ICD-10-CM | POA: Insufficient documentation

## 2011-01-09 DIAGNOSIS — R110 Nausea: Secondary | ICD-10-CM | POA: Insufficient documentation

## 2011-01-09 DIAGNOSIS — Z7901 Long term (current) use of anticoagulants: Secondary | ICD-10-CM | POA: Insufficient documentation

## 2011-01-09 LAB — PROTIME-INR
INR: 1 (ref 0.00–1.49)
INR: 2.09 — ABNORMAL HIGH (ref 0.00–1.49)
Prothrombin Time: 13.6 seconds (ref 11.6–15.2)
Prothrombin Time: 23.6 seconds — ABNORMAL HIGH (ref 11.6–15.2)

## 2011-01-09 LAB — POCT CARDIAC MARKERS
CKMB, poc: 1.7 ng/mL (ref 1.0–8.0)
Myoglobin, poc: 376 ng/mL (ref 12–200)

## 2011-01-09 LAB — BASIC METABOLIC PANEL
CO2: 29 mEq/L (ref 19–32)
Calcium: 8.4 mg/dL (ref 8.4–10.5)
Chloride: 108 mEq/L (ref 96–112)
Creatinine, Ser: 0.83 mg/dL (ref 0.4–1.2)
GFR calc Af Amer: >60 mL/min (ref >60)
GFR calc Af Amer: >60 mL/min (ref >60)
GFR calc non Af Amer: >60 mL/min (ref >60)
Potassium: 4.4 mEq/L (ref 3.5–5.1)
Sodium: 136 mEq/L (ref 135–145)

## 2011-01-09 LAB — CROSSMATCH: Antibody Screen: NEGATIVE

## 2011-01-09 LAB — CBC
HCT: 40.1 % (ref 36.0–46.0)
MCHC: 33.5 g/dL (ref 30.0–36.0)
MCHC: 33.9 g/dL (ref 30.0–36.0)
MCV: 94.4 fL (ref 78.0–100.0)
RBC: 3.99 MIL/uL (ref 3.87–5.11)
RBC: 4.25 MIL/uL (ref 3.87–5.11)
WBC: 4.8 10*3/uL (ref 4.0–10.5)
WBC: 7.5 10*3/uL (ref 4.0–10.5)

## 2011-01-09 LAB — APTT: aPTT: 32 seconds (ref 24–37)

## 2011-01-09 LAB — ABO/RH: ABO/RH(D): A POS

## 2011-01-10 ENCOUNTER — Encounter: Payer: Medicare Other | Admitting: *Deleted

## 2011-01-14 ENCOUNTER — Encounter: Payer: Medicare Other | Admitting: *Deleted

## 2011-01-15 ENCOUNTER — Ambulatory Visit (INDEPENDENT_AMBULATORY_CARE_PROVIDER_SITE_OTHER): Payer: Medicare Other | Admitting: *Deleted

## 2011-01-15 DIAGNOSIS — I4891 Unspecified atrial fibrillation: Secondary | ICD-10-CM

## 2011-01-15 LAB — POCT INR: INR: 2.6

## 2011-01-30 ENCOUNTER — Ambulatory Visit (INDEPENDENT_AMBULATORY_CARE_PROVIDER_SITE_OTHER): Payer: Medicare Other | Admitting: *Deleted

## 2011-01-30 DIAGNOSIS — I4891 Unspecified atrial fibrillation: Secondary | ICD-10-CM

## 2011-01-30 LAB — POCT INR: INR: 3.5

## 2011-02-05 ENCOUNTER — Telehealth: Payer: Self-pay | Admitting: Internal Medicine

## 2011-02-05 NOTE — Telephone Encounter (Signed)
Spoke with patient okay to take Z-pack  Cleared with Pharm D

## 2011-02-05 NOTE — Telephone Encounter (Signed)
Pt to Urgent Care because of cold and they gave her a Z pak but because of her medication they told her she should not take it.  [facility] [address] is the phar she uses.

## 2011-02-12 NOTE — Assessment & Plan Note (Signed)
[facility]                            CARDIOLOGY OFFICE NOTE   GRETA, YUNG                         MRN:          [id number]  DATE:09/18/2007                            DOB:          03/07/1924    Ms. Prewitt returns today for further management of her paroxysmal  atrial fibrillation.   She just does not have any energy.  She says that she just gives out  when she tries to do anything.  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   She has had extensive blood work, all of which has been nonrevealing as  far as fatigue.  Specifically, her thyroid panel as well as her  hemoglobin are normal.   She has not had any symptomatic atrial fibrillation.  She remains on  flecainide 100 mg p.o. b.i.d.  She is also on Pindolol 2.5 mg b.i.d.  because of history of bradycardia.   PHYSICAL EXAMINATION:  VITAL SIGNS:  Blood pressure today is 120/70,  pulse 53 and she is in sinus bradycardia.  She has a borderline first-  degree AV block, otherwise unchanged.  Her weight is 161.  HEENT:  Unchanged.  Carotid upstrokes are equal bilaterally without  bruits.  No JVD.  Thyroid is not enlarged.  Trachea is midline.  LUNGS:  Clear.  HEART:  Regular rate and rhythm with no gallop.  ABDOMEN:  Soft.  EXTREMITIES:  No edema.  Pulses intact.  NEUROLOGIC:  Intact.   ASSESSMENT:  Ms. Laflamme is doing well on maintenance flecainide therapy  and low-dose Pindolol.  I do not see any reason for her generalized  fatigue.  I think she does remarkably well for her age.  She has got  quite a sense of humor.   PLAN:  1. Continue current medications.  2. See me back in 6 months.     Thomas C. Daleen Squibb, MD, [facility]  Electronically Signed    TCW/MedQ  DD: 09/18/2007  DT: 09/20/2007  Job #: [id number]

## 2011-02-12 NOTE — Assessment & Plan Note (Signed)
[facility]                            CARDIOLOGY OFFICE NOTE   WISDOM, SEYBOLD                         MRN:          [id number]  DATE:02/02/2007                            DOB:          [birth year]    Ms. Castrillon returns today for further management of her atrial  fibrillation.  She has had no episodes of atrial fib.  She is on very  low dose pindolol because of her history of tachybrady.  Her blood  pressure has been under good control.   Her meds are unchanged.   PHYSICAL EXAMINATION:  VITAL SIGNS:  Her blood pressure today is 115/65.  Her pulse is 58 and regular.  NECK:  Shows no JVD.  Carotid upstrokes were equal bilaterally without  bruits.  There is no thyromegaly.  LUNGS:  Clear.  HEART:  Reveals a regular rate and rhythm.  ABDOMEN:  Soft.  EXTREMITIES:  Reveal no edema.  Pulses are intact.   I am delighted Ms. Mottern is doing well with low dose pindolol.  I will  plan on seeing her back again in 6 months.     Thomas C. Daleen Squibb, MD, [facility]  Electronically Signed    TCW/MedQ  DD: 02/02/2007  DT: 02/02/2007  Job #: [id number]

## 2011-02-12 NOTE — Assessment & Plan Note (Signed)
[facility]                               LIPID CLINIC NOTE   CLARIS, PECH                         MRN:          [id number]  DATE:02/19/2007                            DOB:          May 06, 1924    [name] comes in today for the first time to the lipid clinic. She  is a patient seen regularly in the Coumadin clinic however. [name]  was referred to [place] by Dr. Daleen Squibb and her primary care doctor, Dr. Kriste Basque,  due to hyperlipidemia and recent intolerance to simvastatin.   PAST MEDICAL HISTORY:  Includes; hypertension, CHF, GERD, anxiety,  irritable bowel syndrome, DJD, and paroxysmal atrial fibrillation.   CURRENT MEDICATIONS:  Include;  1. Zetia 10 mg daily.  2. Fish oil 1 gram twice daily.  3. Warfarin as directed.  4. Pindolol.  5. Flecainide.  6. Vitamin D.  7. Zinc.  8. Folic acid.  9. Aspirin.  10.Multivitamin.  11.Vitamin E.  12.Ambien.   ALLERGIES:  INCLUDE; PENICILLIN AND CODEINE.   She is a nonsmoker and does not drink alcoholic beverages.   PHYSICAL EXAMINATION:  Includes; a weight of 156 pounds, blood pressure  115/60, heart rate 54.   LABORATORY DATA:  Include; total cholesterol of 174, triglycerides 146,  HDL 42.2, LDL 103.   ASSESSMENT:  Because of [name] history her goal triglyceride is  less than 150, HDL greater than 40, and goal LDL less than 100. She  meets all of these except for a slightly elevated LDL of 103. These labs  were drawn while she was still taking simvastatin along with her Zetia  and fish oil. She stopped taking simvastatin due to abdominal pains,  nausea, which subsided upon discontinuing simvastatin. She denies any  muscle aches or pain while on simvastatin. In the past she has taken  Lipitor and Crestor. She seems to have tolerated these just fine but  wanted to stop Lipitor at some point due to fears of it or reports of it  possibly effecting cognitive function and memory. The  cholesterol panel  looks good except for the slightly elevated LDL. I am pretty sure that  coming off simvastatin her LDL will climb and will remain elevated  unless we find another medication that she can add to her regimen.   PLAN:  We decided to add Crestor at a low dose 5 mg a day for 1 week and  then increasing to 10 mg a day as tolerated. [name] was given  samples. I also encouraged her to improve her diet and try to exercise  as much as possible. We did not talk much about diet and exercise at  this visit. Follow up with [place] in 6 weeks at which time we will check her  lipid and liver panel and make any changes that are necessary at that  time. She was urged to call us with any questions or problems in the  meantime.      Anne Hayes, PharmD  Electronically Signed      Anne Sans. Wall, MD, [facility]  Electronically Signed   TP/MedQ  DD: 02/19/2007  DT: 02/19/2007  Job #: [id number]

## 2011-02-12 NOTE — Assessment & Plan Note (Signed)
[facility]                            CARDIOLOGY OFFICE NOTE   Anne Hayes, SPAN                         MRN:          [id number]  DATE:02/10/2008                            DOB:          11/25/1923    [name] comes in today for further management of the following  issues:   1. Paroxysmal atrial fibrillation.  2. History of bradycardia, which is asymptomatic.  3. Anticoagulation.   She fell on April 12, when she missed a step.  She was evaluated in the  emergency department with an uneventful evaluation.  Her INR was 2.5.  She did not hit her head.   She has no complaints today, cardiovascular-wise.  All systems were  reviewed.   Her blood pressure today was 131/63, her pulse 51 and regular, weight is  160.  HEENT:  Normocephalic, atraumatic.  PERRLA.  Extraocular movements  intact.  Sclerae clear.  Facial symmetry normal.  Carotid upstrokes were equal bilaterally without bruits, no JVD.  Thyroid is not enlarged.  Trachea is midline.  LUNGS:  Clear.  HEART:  Reveals regular rate, but slow rhythm, no gallop.  ABDOMINAL EXAM:  Soft, good bowel sounds.  EXTREMITIES:  No edema.  Pulses are intact.  There is no sign of trauma.   ASSESSMENT AND PLAN:  [name] is doing well.  I have made no changes  in her medical program.  I will see her back in six months.     Thomas C. Daleen Squibb, MD, [facility]  Electronically Signed    TCW/MedQ  DD: 02/10/2008  DT: 02/10/2008  Job #: [id number]

## 2011-02-12 NOTE — Assessment & Plan Note (Signed)
[facility]                            CARDIOLOGY OFFICE NOTE   Anne, Hayes                       MRN:          [id number]  DATE:06/20/2009                            DOB:          1924-09-20    Anne Hayes comes in today for following problems.  1. History of paroxysmal atrial fibrillation, well controlled on      flecainide and low-dose pindolol.  2. Paroxysmal or labile hypertension, currently under good control.  3. History of bradycardia, which has been asymptomatic.  4. Anticoagulation, followed in our Coumadin Clinic.   She just finished an ablation therapy of a renal lesion.  Please see the  E-chart for details.   She is totally asymptomatic from a cardiac standpoint.   MEDICATIONS:  1. Coumadin 5 mg as directed.  2. Enteric-coated aspirin 81 mg per day.  3. Flecainide 100 mg p.o. b.i.d.  4. Pindolol 2.5 mg twice a day.  5. Amlodipine 2.5 mg once a day.  6. Pravastatin 80 mg p.o. daily.  7. Zetia 10 mg a day.  8. Fish oil 1000 mg a day.  9. Folic acid 400 mcg a day.  10.Vitamin D 1000 units per day.   PHYSICAL EXAMINATION:  VITAL SIGNS:  Her blood pressure today is 110/60  in the left arm, pulse 52 and regular, height 66.6 inches, weight is  158, BMI is 25.1.  GENERAL:  She looks younger than stated age.  HEENT:  Normal, atraumatic.  NECK:  Carotid upstrokes were equal bilaterally without bruits.  No JVD.  Thyroid is not enlarged.  Trachea is midline.  LUNGS:  Clear to auscultation and percussion.  HEART:  Regular rate and rhythm.  No murmur, rub, or gallop.  ABDOMEN:  Soft, good bowel sounds.  No midline bruit.  EXTREMITIES:  No cyanosis, clubbing, or edema.  Pulses are intact.  NEUROLOGIC:  Intact.  SKIN:  Unremarkable.   EKG shows sinus bradycardia with first-degree AV block, no change.  She  has left axis deviation, no change.   ASSESSMENT AND PLAN:  Anne Hayes is doing well.  We have made no change  in her  medical program.  I will see her back in 6 months.     Thomas C. Daleen Squibb, MD, [facility]  Electronically Signed    TCW/MedQ  DD: 06/20/2009  DT: 06/21/2009  Job #: [id number]

## 2011-02-12 NOTE — Assessment & Plan Note (Signed)
[facility]                            CARDIOLOGY OFFICE NOTE   Anne Hayes, Anne Hayes                         MRN:          [id number]  DATE:12/20/2008                            DOB:          1923-10-05    Anne Hayes comes in today for followup.  She has developed a small area  on her right kidney that is felt to be renal cell carcinoma.  Dr.  Fredia Sorrow of Radiology is planning on doing a percutaneous extirpation of  this lesion.  She also has a right upper lobe nodule that will need  followup CT scan in May per Dr. Antonietta Jewel letter I received today.   She is doing remarkably well from a cardiac standpoint.  Her blood  pressure has been under relatively good control and she has had no  symptomatic atrial fib on flecainide.  She remains on anticoagulation.   She has normal left ventricular function and negative stress Myoview a  little over 2 years ago.   She has no history of congestive heart failure, is not diabetic and has  had no previous stroke.   Her meds are outlined in the chart in specifically EMR and e-chart.   PHYSICAL EXAMINATION:  VITAL SIGNS:  Her blood pressure today is 110/70,  her pulse is 60 and regular.  Her EKG confirms again sinus rhythm and  sinus bradycardia.  Her PR, QRS, and QTC are stable.  HEENT:  Unremarkable.  NECK:  Carotids upstrokes were equal bilaterally without bruits.  Thyroid is not enlarged.  Trachea is midline.  LUNGS:  Clear to auscultation and percussion.  HEART:  Regular rate and rhythm.  ABDOMEN:  Soft.  EXTREMITIES:  No cyanosis, clubbing, or edema.  Pulses are intact.   ASSESSMENT AND PLAN:  Anne Hayes is doing well.  I have cleared her to  stop her Coumadin for 3 days prior to procedure and then restart once  Dr. Fredia Sorrow thinks appropriate from an interventional standpoint.  I do  not think she needs Lovenox overlap.  I will plan on seeing her back  again in 6 months.     Thomas C. Daleen Squibb, MD,  [facility]  Electronically Signed    TCW/MedQ  DD: 12/20/2008  DT: 12/21/2008  Job #: [id number]   cc:   Sherrine Maples T. Fredia Sorrow, M.D.
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
## 2011-02-13 ENCOUNTER — Ambulatory Visit (INDEPENDENT_AMBULATORY_CARE_PROVIDER_SITE_OTHER): Payer: Medicare Other | Admitting: *Deleted

## 2011-02-13 DIAGNOSIS — I4891 Unspecified atrial fibrillation: Secondary | ICD-10-CM

## 2011-02-13 LAB — POCT INR: INR: 3.1

## 2011-02-15 ENCOUNTER — Encounter: Payer: Self-pay | Admitting: Internal Medicine

## 2011-02-15 NOTE — Assessment & Plan Note (Signed)
[facility]                              CARDIOLOGY OFFICE NOTE   Anne Hayes, Anne Hayes                         MRN:          [id number]  DATE:05/19/2006                            DOB:          June 08, 1924    IDENTIFICATION:  Anne Hayes is a 77 year old woman who is followed by  Jesse Sans. Wall, MD, in clinic.  She comes in today with fatigue, shortness  of breath, bradycardia.   HISTORY OF PRESENT ILLNESS:  The patient was last seen on August 15 for  flecainide.  She began flecainide 100 mg b.i.d. and her metoprolol was  increased to 100 mg b.i.d.  She called Sunday to the fellow on call and  complained of feeling weak with a heart rate of 44.  She was told to hold  her metoprolol that evening, cut it down to one tablet, 50 mg daily, and  began this morning.  She took this morning's tablet, still feels weak.  Notes some feeling like her legs are going to give out, no syncope.   CURRENT MEDICATIONS:  1. Zantac 300 mg daily.  2. Zetia 10 mg daily.  3. Coumadin as per directed.  4. Simvastatin 20 mg daily.  5. Multivitamin.  6. Fish oil 1 g t.i.d.  7. Folic acid 800 mg daily.  8. Vitamin E daily.  9. Calcium with D 500 mg b.i.d.  10.Glucosamine b.i.d.  11.Lunesta q.h.s.  12.Metoprolol 50 mg b.i.d.  13.Flecainide 100 mg b.i.d.  14.Aspirin 325 mg daily.   PHYSICAL EXAMINATION:  GENERAL:  The patient is in no distress.  VITAL SIGNS:  Blood pressure is 122/68, pulse is 53, weight 157.  LUNGS:  Clear.  CARDIAC:  Regular rate and rhythm, S1, S2, no S3, no murmurs.  ABDOMEN:  Benign.  EXTREMITIES:  No edema.   Twelve-lead EKG:  Normal sinus bradycardia at 43 beats per minute, PR  interval 190 msec.   IMPRESSION:  The patient has returned to sinus rhythm; however, she is going  quite slow.  I told her to hold her metoprolol tonight and tomorrow morning.  Tomorrow evening if her heart rate is greater than 60, she should take 25 mg  of metoprolol.   If it is still not, she should hold and take 25 mg the  following morning again if her heart rate is greater than 60 beats per  minute.  If it is still quite slow, she should call our office.   Tentatively she is already set for a follow-up in clinic next Monday.  We  will go ahead and add a treadmill test to this as well for now.                                Pricilla Riffle, MD, [facility]    PVR/MedQ  DD:  05/19/2006  DT:  05/20/2006  Job #:  [id number]

## 2011-02-15 NOTE — Assessment & Plan Note (Signed)
[facility]                              CARDIOLOGY OFFICE NOTE   Anne Hayes, Anne Hayes                         MRN:          [id number]  DATE:06/20/2006                            DOB:          1924/03/02    Anne Hayes returns today for management of paroxysmal atrial fibrillation  and bradycardia.   Since starting pindolol 5 mg a day, she has felt better.  Her treadmill was  uneventful as recorded May 26, 2006.  She has had no more dizziness and  no more increased fatigue.   Her biggest complaint today is constipation.  She is taking milk of magnesia  every day.   Her blood pressure today is 142/82.  Her pulse is 56 and sinus bradycardia.  Her EKG is stable.  Her weight is 156.  Lungs are clear.  Heart reveals a  regular rate and rhythm.  Abdominal exam is soft.  Extremities reveal no  edema.   She brings in a whole host of blood pressures today that show a lability of  her systolic pressures.  However, some of them are less than 140.  I have  made no changes in her program today since she is so intolerant of  medicines.   PLAN:  1. Change milk of magnesia to MiraLax or generic over-the-counter      equivalent.  2. Follow up with [place] again in 6 months.                               Thomas C. Daleen Squibb, MD, [facility]    TCW/MedQ  DD:  06/20/2006  DT:  06/23/2006  Job #:  [id number]   cc:   Lonzo Cloud. Kriste Basque, MD

## 2011-02-15 NOTE — Assessment & Plan Note (Signed)
[facility]                            CARDIOLOGY OFFICE NOTE   Anne Hayes, Anne                         MRN:          [id number]  DATE:10/15/2006                            DOB:          02-Dec-1923    [name] returns today for further management of the following  issues:  1. Paroxysmal atrial fibrillation. This has been well-controlled on      flecainide and pindolol. She had one episode a week ago Sunday      where she had some chest pain and felt her heart racing. Her heart      rate was around 120. This is certainly better than the 130s and      14 0s we have seen in the past.  2. She has a history of symptomatic bradycardia. This is better on      pindolol running in the mid-50s at rest. She does have a lot of      fatigue however.  3. Hyperlipidemia. She is INTOLERANT OF SEVERAL STATINS. She is on      Zetia, which I told her to continue.   PHYSICAL EXAMINATION:  Her blood pressure is 136/88, pulse is 52 and  sinus brady. Her weight is 158.  HEENT: Normocephalic, atraumatic. PERRLA. Extra-ocular movements intact.  Sclerae are clear. Carotid upstrokes are equal bilaterally without  bruits. There is no JVD.  Thyroid is not enlarged. Trachea is midline.  LUNGS:  Are clear.  HEART: Reveals a slow rate and rhythm. There is no gallop.  ABDOMEN: Soft with good bowel sounds.  EXTREMITIES: Reveals no edema. Pulses are intact.   We walked around the office and her heart rate went into the mid-60s.  She was mildly short of breath.   Please note that her stress Myoview showed an EF of 72% with no ischemia  or scar. This study was done September 09, 2006.   We have asked Mrs. Anne Hayes to cut her pindolol in half to 2.5 mg a day.  She does not tolerate other beta-blockers very well. Will plan on seeing  her back in 4 weeks.     Thomas C. Daleen Squibb, MD, [facility]  Electronically Signed    TCW/MedQ  DD: 10/15/2006  DT: 10/15/2006  Job #: [id number]

## 2011-02-15 NOTE — Discharge Summary (Signed)
Anne Hayes, FORTON                ACCOUNT NO.:  [id number]   MEDICAL RECORD NO.:  [id number]          PATIENT TYPE:  INP   LOCATION:  2008                         FACILITY:  [facility]   PHYSICIAN:  Lonzo Cloud. Kriste Basque, M.D. [facility] OF BIRTH:  [birth year]   DATE OF ADMISSION:  05/07/2005  DATE OF DISCHARGE:  05/09/2005                                 DISCHARGE SUMMARY   FINAL DIAGNOSES:  1.  Admitted May 07, 2005 with new onset atrial fibrillation with rapid      ventricular response. Patient treated with Lovenox, Coumadin, and      diltiazem drip; with conversion to normal sinus rhythm. Cardiology      consult with Dr. Maringouin Bing, with recommendation for changing to      metoprolol therapy and Coumadin. The 2-D echocardiogram is pending at      the time of discharge.  2.  Long history of tachy palpitations; followed by Dr. Daleen Squibb in cardiology.      Previous event monitor had been unrevealing. The patient had refused a      repeat event monitor screening.  3.  Hypertension.  Controlled on medications. Medications adjusted this      admission.  4.  History of hypercholesterolemia.  Currently on Zetia. She is intolerant      to Crestor and refused Lipitor.  5.  History of asthmatic bronchitis and dyspnea on exertion.  6.  History of gastroesophageal reflux disease and irritable bowel syndrome.  7.  History of renal cysts and urinary tract infections. She had previous AP      repair and a Raz urethropexy in 1989.  8.  Status post cholecystectomy, appendectomy, and hysterectomy.  9.  History of degenerative arthritis and low back pain; evaluated by Dr.      Darrelyn Hillock and Dr. Gerlene Fee in the past.  10. History of anxiety and depression after the death of her second husband      earlier this year.  11. History of allergy to PENICILLIN and CODEINE.   BRIEF HISTORY AND PHYSICAL:  The patient is a 77 year old white female whom  I have followed for general medical purposes. She has a history of  high  blood pressure, tachy palpitations without arrhythmias identified on  previous workup of. She walked into the office on May 07, 2005 with a 3-4  day history of intermittent rapid irregular palpitations. EKG showed atrial  fibrillation and rapid ventricle response. She denied any chest pain or  syncope, but did have some dizziness and weakness. She denied any fevers,  chills or sweats, or signs of underlying infection. She had no signs of  thyroid imbalance. She denied nicotine, caffeine or over-the-counter sinus  medications recently. She does have some moderate anxiety.   Review of the record indicated she had recently been seen by cardiology on  several occasions, with Dr. Daleen Squibb and Dr. Graceann Congress attending. Her EKG  had showed normal sinus rhythm and a few PACs, with nonspecific ST-T wave  changes and no acute abnormalities. She had refused repeat event monitor  because of previous experience in the fact  that it was nonrevealing in the  past. She had a Cardiolite in 2004 that showed no evidence of ischemia or  infarction; ejection fraction was 71%. She did have a brief episode of SVT  with exercise, but this resolved at rest. Her last 2-D echocardiogram in  2004 showed mildly dilated left atrium (which measured 40 mm),  and trace MR  and TR.   PAST MEDICAL HISTORY:  She has a history of hypertension, controlled on  hydrochlorothiazide alone. She was intolerant to Hyzaar in the past. She has  a history of hypercholesterolemia, currently on Zetia per her request. She  was intolerant to Crestor in the past and refused Lipitor therapy. Lipid  profile was pending on the Zetia. She has history of asthmatic bronchitis  and dyspnea on exertion. She has a history of gastroesophageal reflux  disease, with an endoscopy in 1998 by Dr. Kinnie Scales. She had a small gastric  ulcer at that time, and follow-up endoscopy in 2000 was negative. She has a  history of irritable bowel syndrome, and  had a negative colonoscopy in 1998.  She has a history of left kidney upper pole renal cyst, and was evaluated by  Dr. [place] Service in 2005. She has a history of recurrent urinary tract  infections and some urge incontinence. She had previous bladder surgery with  an AP repair and a Raz urethropexy in 1989 by Dr. Patsi Sears. She has also  had previous cholecystectomy, appendectomy and hysterectomy. She has a  history of degenerative arthritis and low back pain, with evaluations from  Dr. Darrelyn Hillock and Dr. Gerlene Fee in the past. She had a myelogram that showed  some spondylosis, and she was treated with several shots in her back. She  has a history of anxiety and some underlying depression after the passing of  her second husband earlier this year. She has a history of allergy to  PENICILLIN and CODEINE, and is intolerant to numerous medications including  DETROL, DITROPAN, HYZAAR, CRESTOR.   Her medications at the time of admission included:  hydrochlorothiazide 12.5  mg p.o. q.d.; enteric-coated aspirin 1 mg p.o. q.d.; Zetia 10 mg p.o.  q.h.s.; ranitidine 150 mg p.o. b.i.d.; multivitamin daily and calcium  supplementation.   PHYSICAL EXAMINATION:  Physical examination revealed a 77 year old white  female in no acute distress. Blood pressure 130/80, pulse 115 and irregular,  respirations 22 and not labored, temperature 98 degrees, O2 saturation 97%  on room air. HEENT:  Exam was unremarkable; could not see the fundi well.  NECK:  Showed no jugular venous distension, no carotid bruits, no  thyromegaly or lymphadenopathy. CHEST:  Clear to percussion and  auscultation. CARDIAC:  Revealed an irregular rhythm with a grade 1/6  systolic ejection murmur in the left sternal border.  No rubs or gallops  heard. ABDOMEN:  Soft and nontender, without evidence of organomegaly or  masses.  Bowel sounds were normal.  RECTAL:  Deferred.  EXTREMITIES:  Showed no cyanosis, clubbing or edema. NEUROLOGIC:   Exam was intact without focal  abnormalities detected. SKIN:  Negative without significant lesions.   LABORATORY DATA:  Initial EKG showed atrial fibrillation with rapid  ventricular response. After conversion to normal sinus rhythm, she had EKG  which had some nonspecific ST-T wave changes and normal sinus as noted.  CHEST X-RAY:  Showed heart shadowed at the upper limits and normal in size,  and a few linear scars at the bases -- compatible with some atelectasis. No  evidence for CHF, etc.  Hemoglobin 14.9, hematocrit 43.7, white count 11,400  with 78% segs. Sedimentation rate 19. Prothrombin time 12.8, INR 0.9, PTT 30  seconds. Sodium 130, potassium 3.8, chloride 106, CO2 27, BUN 16, creatinine  1.0, blood sugar 101, calcium 9.4, total protein 6.8, albumin 3.8, AST 25,  ALT 22, alkaline phosphatase 51, total bilirubin 0.5. CPK 105 with negative  MB and negative troponin. Beta nitrate peptide 182. TSH 2.27.   HOSPITAL COURSE:  The patient was admitted with atrial fibrillation and a  rapid ventricular response. She was placed on IV Cardizem and given Lovenox  per pharmacy. She converted overnight with the Cardizem drip to normal sinus  rhythm. She was then placed on p.o. Cardizem and the drip was weaned off.   She was seen in consultation by Dr. Dietrich Pates for the cardiology service. He  noted relative bradycardia and recommended stopping the Cardizem, in favor  of metoprolol 25 mg p.o. t.i.d.  Coumadin was started by pharmacy protocol.  Dr. Dietrich Pates felt that she was low risk for thromboembolic events and could  be coumadinized in outpatient. For this reason, she was felt to be MHB and  ready for discharge on May 09, 2005. A 2-D echocardiogram was pending at  the time of this dictation.   MEDICATIONS AT DISCHARGE:  1.  Metoprolol 50 mg tablets 1/2 tablet p.o. t.i.d. with an outpatient      follow-up in five days.  2.  Coumadin 5 mg one p.o. q.d. (we plan prothrombin time at that  time).  3.  Zetia 10 mg p.o. q.h.s.  4.  Multivitamin daily.  5.  Calcium supplementation.  6.  Ranitidine 150 mg p.o. b.i.d.   She is instructed to discontinue her previous HCT therapy and baby aspirin  for the time being.   DISPOSITION:  Follow-up in the office on Tuesday May 14, 2005 at 1:45  p.m. We plan prothrombin time at that time and set her up with the Coumadin  Clinic; and a follow-up with Dr. Daleen Squibb.   CONDITION ON DISCHARGE:  Improved.       SMN/MEDQ  D:  05/09/2005  T:  05/09/2005  Job:  [id number]   cc:   Jesse Sans. Wall, M.D.

## 2011-02-15 NOTE — Assessment & Plan Note (Signed)
[facility]                              CARDIOLOGY OFFICE NOTE   NATURI, ALARID                         MRN:          [id number]  DATE:04/22/2006                            DOB:          [birth year]    REASON FOR VISIT:  Anne Hayes returns today for management of paroxysmal  atrial fibrillation.   She has been having some weak spells over the last two weeks.  She denies  any presyncope or syncope.  On her home monitor blood pressure cuff a week  ago Sunday, she was feeling weak and her blood pressure was measuring at 75.   She is in atrial fibrillation today at a rate of 100.  This is the first  time we have seen her in the this in some time.  I suspect she was in it or  has been in and out of it over the last couple of weeks.   Her only cardiac medications at present other than a statin and aspirin 81  mg daily, is metoprolol 50 mg b.i.d.   PHYSICAL EXAMINATION:  VITAL SIGNS:  Her blood pressure today is 130/82, her  pulse is 100 and irregular, her weight is 155, which is stable.  GENERAL:  She is in no acute distress.  NECK:  No JVD.  Carotids are full without bruits.  LUNGS:  Clear.  HEART:  Irregular rate and rhythm.  ABDOMEN:  Soft.  EXTREMITIES:  No edema, pulses are intact.   LABORATORY DATA:  EKG confirms atrial fibrillation which in a couple of ways  looks like some flutter, but it does not march out by calipers.   PLAN:  1.  Increase aspirin to 325 mg daily.  2.  Increase metoprolol to 75 mg b.i.d.  3.  See [place] back in 10 days to two weeks for a clinical check and EKG with      me.   If she remains in atrial fibrillation, we are going to have to anticoagulate  and also think about electrical cardioversion plus/minus pharmaceutical  therapy.                               Thomas C. Daleen Squibb, MD, [facility]    TCW/MedQ  DD:  04/22/2006  DT:  04/22/2006  Job #:  [id number]   cc:   Lonzo Cloud. Kriste Basque, MD

## 2011-02-15 NOTE — Assessment & Plan Note (Signed)
[facility]                              CARDIOLOGY OFFICE NOTE   CICILY, BONANO                         MRN:          [id number]  DATE:05/14/2006                            DOB:          01-13-1924    Ms. Plunk returns today for further management of paroxysmal atrial  fibrillation.  Since I last saw her, she states she has been in and out of  it several times.  She is in it today at a rate of 120 beats per minute.  She had a protime checked on Monday which she says was therapeutic.  We  increased her aspirin just for an extra precaution last time to 325 a day.  We had increased her Metoprolol at the last visit to 75 b.i.d.   PHYSICAL EXAMINATION:  GENERAL:  She is in no acute distress.  VITAL SIGNS:  Blood pressure 128/82, pulse 120 and irregular, weight 157.  LUNGS:  Clear.  HEART:  Irregular rate and rhythm.  ABDOMEN:  Soft.  EXTREMITIES:  No edema, pulses are intact.   ASSESSMENT:  Paroxysmal atrial fibrillation which is becoming more  consistent.   PLAN:  1. Begin Flecainide 100 b.i.d.  Check EKG in a week to evaluate for PR,      QRS, and QTC intervals.  2. Increase Metoprolol to 100 b.i.d.  3. Make sure protime was therapeutic on Monday.  4. See me back in two weeks.                               Thomas C. Daleen Squibb, MD, [facility]    TCW/MedQ  DD:  05/14/2006  DT:  05/14/2006  Job #:  [id number]   cc:   Lonzo Cloud. Kriste Basque, MD

## 2011-02-15 NOTE — Consult Note (Signed)
Anne Hayes, BETTENDORF                ACCOUNT NO.:  [id number]   MEDICAL RECORD NO.:  [id number]          PATIENT TYPE:  INP   LOCATION:  2008                         FACILITY:  [facility]   PHYSICIAN:  Rowan Bing, M.D. LHCDATE OF BIRTH:  [birth year]   DATE OF CONSULTATION:  05/08/2005  DATE OF DISCHARGE:                                   CONSULTATION   REFERRING PHYSICIAN:  Dr. Kriste Basque.   PRIMARY CARDIOLOGIST:  Dr. Daleen Squibb.   HISTORY OF PRESENT ILLNESS:  A 77 year old woman with newly-diagnosed  paroxysmal atrial fibrillation. Anne Hayes has had a history of  palpitations without a specific, prior diagnosis. She experiences  lightheadedness and a sense that she might black out. She has generalized  weak spells. She had a recent episode of chest discomfort. She has chronic  class 2 dyspnea on exertion.   She has had pharyngitis and sought evaluation by her primary care physician  and noted a rapid irregular rhythm, prompting hospital admission. She  converted to sinus after initial dosing with diltiazem.   Anne Hayes was seen by Dr. Daleen Squibb a few years ago for a general cardiac  assessment and for management of risk factors including hypertension and  hyperlipidemia. Cardiolite was negative at that time.   Past medical history is otherwise notable for polyps and GERD. Surgeries  have included a rotator cuff repair.   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 A 45 pack-year history of cigarette smoking that  was discontinued 15 years ago.   FAMILY HISTORY:  Mother died at 32 due to CVA; father died at age 47 with  coronary artery disease.   ALLERGIES:  The patient reports allergies or adverse effects to PENICILLIN  and CODEINE.   MEDICATIONS:  1.  Calcium supplement.  2.  Multivitamin.  3.  Zantac 150 milligrams b.i.d.  4.  Aspirin 325 milligrams q.d.  5.  Folate 800 milligrams q.d.  6.  HCTZ 12.5 milligrams q.d.  7.   Fish oil 2 capsules q.d.  8.  Ezetimibe 10 milligrams q.d.   REVIEW OF SYSTEMS:  Notable for occasional headaches, few arthralgias, and  heartburn. All other systems reviewed and are negative.   PHYSICAL EXAMINATION:  GENERAL:  Pleasant, trim woman in no acute distress.  VITAL SIGNS:  Temperature is 97.7, heart rate 65 and regular, respirations  20, blood pressure 95/60, O2 saturation 94% on room air.  HEENT: Anicteric sclerae; mild arcus senilis; pupils equal, round, react to  light. EOMs full.  NECK: No jugular venous distension; normal carotid upstrokes without bruits.  ENDOCRINE: No thyromegaly.  HEMATOPOIETIC: No adenopathy.  SKIN: No significant lesions.  LUNGS: Clear.  CARDIAC: Normal first and second heart sounds; fourth heart sound and very  modest systolic ejection murmur noted.  ABDOMEN: Soft and nontender; no organomegaly; aortic pulsation not  appreciated.  EXTREMITIES: Distal pulses intact; no edema.  NEUROMUSCULAR: Symmetric strength and tone; normal cranial nerves.  MUSCULOSKELETAL: No joint deformities.  PSYCHIATRIC: Oriented and alert; normal affect.   LABORATORY DATA:  Laboratory is unremarkable. BNP slightly  elevated at 182.  TSH is normal. Cardiac markers are normal.  EKG: Normal sinus rhythm; minimal nonspecific T-wave abnormality.   Chest x-ray:  Mild chronic scarring that is unchanged from a prior film of  two years ago.   IMPRESSION:  Anne Hayes has had palpitations for some time, likely  representing paroxysmal atrial fibrillation. Her lightheadedness is also  probably related to this arrhythmia. It is unclear whether her chest pain  occurred in the setting of palpitations. At this point, I would control her  arrhythmia with a beta blocker if tolerated. If she continues to be  symptomatic, digoxin can be added. Anticoagulation has been initiated.  Further adjustment of her warfarin dosage can be accomplished as an  outpatient. There is no need for  heparin or Lovenox once she is discharged.  An echocardiogram is pending. If negative as expected, she can be discharged  in the morning. We will be happy to enroll her in our anticoagulation  clinic. She already has an appointment for reassessment by Dr. Daleen Squibb later  this month.      [place] Bing, M.D. [facility]  Electronically Signed     RR/MEDQ  D:  05/08/2005  T:  05/08/2005  Job:  [id number]

## 2011-02-15 NOTE — Assessment & Plan Note (Signed)
[facility]                           GASTROENTEROLOGY OFFICE NOTE   Anne Hayes, Anne Hayes                         MRN:          [id number]  DATE:08/19/2006                            DOB:          [birth year]    GI CONSULTATION:  Anne Hayes is a nice 77 year old patient of Dr. Kriste Basque and Dr. Daleen Squibb, who is  here is here today to discuss constipation and colorectal screening.  She  has been a former patient of Dr. Victorino Dike, later on Dr. Jarold Motto, and  most recently Dr. Kinnie Scales, who did a colonoscopy and upper endoscopy on April 26, 1997, with findings of normal exam, rectal fissure, and a small gastric  ulcer that was benign and H. pylori-negative.  She has been suffering from  chronic constipation, which has progressed since she was started on  flecainide by Dr. Daleen Squibb for her atrial fibrillation.  She was subsequently  treated with Miralax but Miralax made her sick and she ended up in the  emergency room on 2 occasions with vomiting and intolerance to Miralax.  Since then she has taken prune juice on a daily basis 6-8 ounces at bedtime  and sometimes solid prunes in the morning.  With this regimen her bowel  movements occur on a daily basis, usually a formed stool followed by a  looser stool.  Her weight has been stable, about 3 pounds over her usual  weight.  She is quite active, walking her dog on a daily basis.  Her level  of energy has been good.  She denies any rectal bleeding, and there is no  family history of colon cancer.   MEDICATIONS:  1. Zetia 10 mg p.o. daily.  2. Ranitidine 300 mg q.h.s.  3. Coumadin 5 mg daily.  4. Simvastatin 20 mg p.o. daily.  5. Multiple vitamin.  6. Fish oil.  7. Folic acid.  8. Vitamin E.  9. Calcium supplements with vitamin D.   PAST HISTORY:  1. High blood pressure.  2. Heart rhythm problems, specifically atrial fibrillation.  3. Hyperlipidemia.  4. Degenerative joint disease.   OPERATION:  1.  Cholecystectomy in 1973.  2. Hysterectomy in 1977.  3. Appendectomy.   FAMILY HISTORY:  Negative for colon cancer.  Positive for heart disease in  her father.   SOCIAL HISTORY:  She is widowed, having 1 child.  She does not smoke, does  not drink alcohol.   REVIEW OF SYSTEMS:  Eyeglasses.  __________ complaints.  Heart rhythm  problems.  Neck pain.   PHYSICAL EXAMINATION:  VITAL SIGNS:  Blood pressure 174/72, pulse 72, and  weight 155 pounds.  GENERAL:  She was alert and oriented, in no distress.  HEENT:  Her sclerae were not icteric.  Oral cavity was normal.  NECK:  Supple without adenopathy.  LUNGS:  Clear to auscultation.  CARDIAC:  Normal S1, normal S2, rhythm was regular.  ABDOMEN:  Soft, nontender, with normoactive bowel sounds.  No bruit.  Liver  edge at costal margin.  RECTAL:  Rectal exam today shows somewhat decreased rectal tone but soft,  Hemoccult-negative stool  in the ampulla.  EXTREMITIES:  No edema.   IMPRESSION:  A 77 year old white female with functional constipation,  possibly related to the combination of medications, possibly due to  decreased activity as well as decreased food intake in general.  She does  not show any evidence of occult gastrointestinal blood loss.  Her last  colonoscopy was in 1998.   PLAN:  We have discussed colonoscopy.  She is not at high risk for colon  cancer but has had increasing symptoms of constipation which are most likely  functional, possibly due to diverticulosis.  We discussed possibility that  she would stay on Coumadin while having colonoscopy to decrease the risk of  having thromboembolic disease.  The patient would like to go ahead with  colonoscopy but stay on Coumadin.  She realizes the limitations of not being  able to remove a polyp if we find a larger polyp.  The chances of that are  small since she is heme-negative and she never had a polyp on previous  colonoscopy.   1. Colonoscopy scheduled while on  Coumadin.  2. Routine colonoscopic prep has been discussed with the patient.  She      heard the appropriate monitoring and the sedation.     Hedwig Morton. Juanda Chance, MD  Electronically Signed    DMB/MedQ  DD: 08/19/2006  DT: 08/19/2006  Job #: [id number]   cc:   Lonzo Cloud. Kriste Basque, MD

## 2011-02-15 NOTE — Assessment & Plan Note (Signed)
[facility]                            CARDIOLOGY OFFICE NOTE   Anne Hayes, Anne Hayes                         MRN:          [id number]  DATE:11/11/2006                            DOB:          [birth year]    [name] returns today for further management of the following  issues:  1. Paroxysmal atrial fibrillation.  She was bradycardic in the office      last time we walked her and her heart rate stayed in the 60s.  We      decreased her pindolol to 2.5 mg a day.  She says splitting these      pills is extremely hard and painful!  2. Hypertension.  3. Hyperlipidemia.   She is feeling better, she has less dyspnea on exertion.  She still has  some fatigue.  I reminded her she was 56, though she looks like she is  about 62.  She just had her 82th birthday party at [place] with 26  guests!   Her blood pressure is 116/64, pulse is 56, and she is in sinus brady.  Her PR and Q-R-S QTc are within normal limits.  HEENT:  Normocephalic/atraumatic, PERRLA, extraocular movements intact,  sclerae are clear.  Carotid upstrokes are equal bilaterally without bruits, there is no JVD,  thyroid is not enlarged.  LUNGS:  Clear.  HEART:  Reveals a regular rate and rhythm.  ABDOMINAL:  Soft with good bowel sounds.  EXTREMITIES:  Reveal no edema, pulses are intact.  NEURO:  Intact.   [name] seems to be symptomatically better, at least from a  functional standpoint with reducing her pindolol.  I have made no  changes in her program.  I will plan on seeing her back in 3 months.     Thomas C. Daleen Squibb, MD, [facility]  Electronically Signed    TCW/MedQ  DD: 11/11/2006  DT: 11/11/2006  Job #: [id number]

## 2011-02-15 NOTE — Assessment & Plan Note (Signed)
[facility]                            CARDIOLOGY OFFICE NOTE   KANIA, REGNIER                         MRN:          [id number]  DATE:09/05/2006                            DOB:          08/19/1924    Anne Hayes returns today for further management of the following  issues:   1. Paroxysmal atrial fibrillation, well-controlled on flecainide and      pindolol.  2. History of symptomatic bradycardia, on standard beta blocker      therapy.  3. Hyperlipidemia.  She is having a lot of aching in her legs and      weakness when she is walking.  She wonders if it is any of her      medications.  It possibly could be simvastatin.  She is also on      Zetia.  4. Hypertension.   She brings her blood pressure recordings in today.  They are mostly  running in the 150s systolic.  We checked her machine here and it is  actually measuring high.  Her machine measured 153/83, ours measured  120/78 with the same-sized cuff.   Her medications are outlined and are essentially unchanged.   PHYSICAL EXAMINATION:  VITAL SIGNS:  Her blood pressure is 120/76, pulse  55 and regular.  She is in sinus brady.  Weight is 156.  HEENT:  Normocephalic, atraumatic.  PERRLA.  Extraocular movements  intact.  Sclerae clear.  Facial symmetry is normal.  Dentition is  satisfactory.  NECK:  Carotid upstrokes are equal bilaterally, without bruits.  No JVD.  Thyroid is not enlarged.  Trachea is midline.  LUNGS:  Clear.  HEART:  Reveals a regular rate and rhythm.  PMI is nondisplaced.  ABDOMINAL EXAM:  Soft, good bowel sounds, no organomegaly.  EXTREMITIES:  Extremities revealed no cyanosis or clubbing.  There is  trace edema.  Pulses are intact.  MUSCULOSKELETAL:  Intact.  SKIN:  Intact.   ASSESSMENT AND PLAN:  I had a long talk with Anne Hayes today.  She  clearly needs to get a new blood pressure cuff and monitor at home.  Anne Hayes is several years old.  In addition, because of  some shortness of  breath and some chest tightness with exertion, I have ordered an  exercise stress Myoview.  I would like to do this on pindolol to make  sure she has adequate heart rate control and also has chronotropic  competence.   We have also stopped her Simvastatin for two weeks.  If this helps her  fatigue, she will let me know.  If not, she will go back on it.  Otherwise, I will see her back again in six months.     Thomas C. Daleen Squibb, MD, [facility]  Electronically Signed    TCW/MedQ  DD: 09/05/2006  DT: 09/05/2006  Job #: [id number]   cc:   Lonzo Cloud. Kriste Basque, MD

## 2011-02-15 NOTE — Assessment & Plan Note (Signed)
[facility]                              CARDIOLOGY OFFICE NOTE   LISHA, VITALE                         MRN:          [id number]  DATE:05/26/2006                            DOB:          [birth year]    TREADMILL REPORT   Ms. Wolff returns today for her atrial fibrillation and bradycardia.   After I saw her in the office and initiated flecainide, she became  bradycardic on her increased dose of metoprolol.  She saw Dr. Tenny Craw who  decreased her metoprolol to 25 mg b.i.d.  She remained slowed and Dr.  Diona Browner changed her from metoprolol to pindolol 5 mg b.i.d.   She has felt better, but still has periods of feeling weak and lightheaded.   OBJECTIVE:  VITAL SIGNS:  Blood pressure 122/81, heart rate of 60 in sinus  rhythm.  Her EKG is essentially unremarkable.  Specifically, her PR interval  is 190 msec, QT is normal and QRS is normal.   We exercised for 3 minutes on a standard protocol.  Her heart rate increased  to 83 beats a minute in sinus rhythm.  There was no arrhythmias.  METS level  achieved was only 4.6.  She only achieved 60% of her predicted maximum heart  rate.   Her blood pressure increased from 122/81 to 172/67.  She became slightly  short of breath.   ASSESSMENT/PLAN:  1. Paroxysmal atrial fibrillation, now in sinus rhythm on flecainide.  2. Significant bradycardia in the 40s on metoprolol.  This was changed to      pindolol which seems to have helped.  3. Limited exercise tolerance.   PLAN:  1. Continue current medications.  2. See me back in about 4 weeks.  3. If continues to have bradycardic-tachycardic problems, she will need a      permanent pacemaker.  The patient was made aware of this today.                               Thomas C. Daleen Squibb, MD, [facility]   TCW/MedQ  DD:  05/26/2006  DT:  05/26/2006  Job #:  [id number]   cc:   Lonzo Cloud. Kriste Basque, MD

## 2011-02-15 NOTE — Assessment & Plan Note (Signed)
[facility]                                 ON-CALL NOTE   Anne Hayes, Anne Hayes                         MRN:          [id number]  DATE:08/25/2006                            DOB:          23-Jan-1924    Monday, September 08, 2006.   TIME OF CALL:  August 25, 2006, at 1733.   I received a telephone call for Anne Hayes concerning her blood  pressure.  I called her back at [phone].  Message states high blood  pressure, please call back.  I called Anne Hayes at the above phone  number and spoke with her directly.  She states she is a patient of Dr.  Vern Claude and has a history of irregular heart beat and high blood  pressure.  She states she checks her blood pressure at home.  She does  not feel bad but she is concerned because her blood pressure is  radiating 150s systolic.  She denies lightheadedness or dizziness.  I  asked her what medication she was on.  She went through the medication  list with me.  I told her I did not think that her blood pressure was  critically elevated.  She was not having any symptoms associated with  elevated blood pressure.  I asked her to not check it anymore the rest  of the evening, to call our office in the morning and make an  appointment with Dr. Daleen Squibb for evaluation of her blood pressure.  I asked  her to bring her machine with her so that we could check it against our  blood pressure cuffs to make sure she was getting an accurate reading.      Dorian Pod, ACNP  Electronically Signed      Noralyn Pick. Eden Emms, MD, [facility]  Electronically Signed   MB/MedQ  DD: 09/09/2006  DT: 09/09/2006  Job #: [id number]

## 2011-02-15 NOTE — Op Note (Signed)
Anne Hayes, Anne Hayes                            ACCOUNT NO.:  [id number]   MEDICAL RECORD NO.:  [id number]                   PATIENT TYPE:  AMB   LOCATION:  DAY                                  FACILITY:  [facility]   PHYSICIAN:  Georges Lynch. Darrelyn Hillock, M.D.             DATE OF BIRTH:  [birth year]   DATE OF PROCEDURE:  08/09/2003  DATE OF DISCHARGE:                                 OPERATIVE REPORT   SURGEON:  Georges Lynch. Darrelyn Hillock, M.D.   ASSISTANT:  Ebbie Ridge. Paitsel, P.A.-C.   PREOPERATIVE DIAGNOSES:  1. Frozen shoulder, right shoulder.  2. Severe impingement syndrome, right shoulder.  3. Central tear of the rotator cuff tendon, right shoulder.   POSTOPERATIVE DIAGNOSES:  1. Frozen shoulder, right shoulder.  2. Severe impingement syndrome, right shoulder.  3. Central tear of the rotator cuff tendon, right shoulder.   OPERATION/PROCEDURE:  1. Closed manipulation, right shoulder, under general anesthesia.  2. Open decompression, right shoulder.  3. A Graft Jacket tendon graft to the rotator cuff, right shoulder.   DESCRIPTION OF PROCEDURE:  Under general anesthesia, I did a gentle closed  manipulation of the right shoulder.  Once this was done, we did a sterile  prep and draping.  An incision was carried out over the anterior aspect of  the right shoulder.  Bleeders were identified and cauterized.  She had 500  mg of vancomycin preoperatively.   The incision was carried down to the deltoid tendon.  This was stripped from  the acromion by sharp dissection.  I then noted that she had a marked  thickening of her acromion.  The acromion literally was imbedding down into  the tendon and the tendon was severely shredded.  In fact, she had a full-  thickness tear of the tendons centrally.  It was not torn from the bone per  se.  I at first protected the tendon with the Bennett retractor and did a  partial acromionectomy and acromioplasty utilizing oscillating saw and the  bur.  Once this was  done, I bone-waxed the raw end of the acromion.  I then  inserted some plain Gelfoam after I irrigated out the wound and I repaired  the tendon defect with a double thickness Graft Jacket.  No anchored  highlight __________ that were used.  This was a central-type tear.  The  tendon was completely eroded.  We simply did a reinforcement of this tendon.  We thoroughly irrigated out the area, reapproximated the deltoid tendon and  muscle in the usual fashion.  Subcutaneous tissue was closed with 0 Vicryl.  Skin with metal staples and a sterile dressing was applied.  She was placed  in a shoulder immobilizer.  Ronald A. Darrelyn Hillock, M.D.   RAG/MEDQ  D:  08/09/2003  T:  08/09/2003  Job:  [id number]

## 2011-02-20 ENCOUNTER — Ambulatory Visit (INDEPENDENT_AMBULATORY_CARE_PROVIDER_SITE_OTHER): Payer: Medicare Other | Admitting: Internal Medicine

## 2011-02-20 ENCOUNTER — Encounter: Payer: Self-pay | Admitting: Internal Medicine

## 2011-02-20 DIAGNOSIS — I4891 Unspecified atrial fibrillation: Secondary | ICD-10-CM

## 2011-02-20 DIAGNOSIS — I10 Essential (primary) hypertension: Secondary | ICD-10-CM

## 2011-02-20 MED ORDER — CAPSAICIN-MENTHOL-METHYL SAL 0.025-1-12 % EX CREA: 1.0000 | TOPICAL_CREAM | CUTANEOUS | Status: DC

## 2011-02-20 MED ORDER — AMIODARONE HCL 200 MG PO TABS: 100.0000 mg | ORAL_TABLET | Freq: Every day | ORAL | Status: DC

## 2011-02-20 NOTE — Patient Instructions (Addendum)
Your physician has recommended you make the following change in your medication: Decrease Amiodarone to 100 mg daily (Take 1/2 tablet of your 200 mg tablets.) Start Capsicin cream. As directed for gluteal pain.  Your physician recommends that you schedule a follow-up appointment in: 3 months with Dr. Johney Frame.

## 2011-02-20 NOTE — Progress Notes (Signed)
The patient presents today for routine electrophysiology followup.  Since last being seen in our clinic, the patient reports doing very well.  She is pleased with the results of her ablation and is maintaining sinus rhythm.  She denies procedure related complications.  Today, she denies symptoms of palpitations, chest pain, shortness of breath, orthopnea, PND, lower extremity edema, dizziness, presyncope, syncope, or neurologic sequela.  The patient feels that she is tolerating medications without difficulties and is otherwise without complaint today.   She reports sitting on the armseat of a chair last week and has had mild gluteal soreness since that time.  She requests a "cream" for this.  Past Medical History  Diagnosis Date  . Cough   . Hypertension   . Atrial fibrillation   . Cerebrovascular disease, unspecified   . Hyperlipidemia   . Esophageal reflux   . Unspecified disorder of kidney and ureter   . Malignant neoplasm of kidney, except pelvis   . Osteoarthrosis, unspecified whether generalized or localized, unspecified site   . Generalized anxiety disorder   . H/O: hysterectomy 1977   Past Surgical History  Procedure Date  . Appendectomy   . Cholecystectomy   . Bunionectomies      BILATERAL  . Anterior and posterior vaginal repair 1989    AP REPAIR & RAZ URETHROPEXY    Current Outpatient Prescriptions  Medication Sig Dispense Refill  . Alum & Mag Hydroxide-Simeth (MAGIC MOUTHWASH) SOLN Take by mouth as directed.        [name] amiodarone (PACERONE) 200 MG tablet Take 200 mg by mouth daily.       [name] amLODipine (NORVASC) 5 MG tablet Take 5 mg by mouth daily.        [name] aspirin 81 MG EC tablet Take 81 mg by mouth daily.        . calcium-vitamin D (CALCIUM 500+D) 500-200 MG-UNIT per tablet Take 1 tablet by mouth daily.        . Cholecalciferol (VITAMIN D3) 1000 UNITS tablet Take 1,000 Units by mouth daily.        [name] ezetimibe (ZETIA) 10 MG tablet Take 10 mg by mouth daily.        . fish  oil-omega-3 fatty acids 1000 MG capsule Take 1 g by mouth daily.        . folic acid (FOLVITE) 400 MCG tablet Take 400 mcg by mouth daily.        [name] omeprazole (PRILOSEC) 40 MG capsule Take 40 mg by mouth as needed.       . pravastatin (PRAVACHOL) 80 MG tablet Take 80 mg by mouth daily.        [name] warfarin (COUMADIN) 5 MG tablet Take by mouth as directed.          Allergies  Allergen Reactions  . Codeine     REACTION: nausea/vomiting  . Penicillins     REACTION: nausea/vomiting    History   Social History  . Marital Status: Widowed    Spouse Name: N/A    Number of Children: N/A  . Years of Education: N/A   Occupational History  . RETIRED    Social History Main Topics  . Smoking status: Former Games developer  . Smokeless tobacco: Not on file  . Alcohol Use: Yes  . Drug Use: No  . Sexually Active:    Other Topics Concern  . Not on file   Social History Narrative   WIDOWEDFORMER SMOKERETOH YESNO DRUG USERETIRED    Family History  Problem Relation Age of Onset  . Heart disease Father    Physical Exam: Filed Vitals:   02/20/11 0937  BP: 130/70  Pulse: 55  Height: 5\' 7"  (1.702 m)  Weight: 146 lb (66.225 kg)    GEN- The patient is well appearing, alert and oriented x 3 today.   Head- normocephalic, atraumatic Eyes-  Sclera clear, conjunctiva pink Ears- hearing intact Oropharynx- clear Neck- supple, no JVP Lymph- no cervical lymphadenopathy Lungs- Clear to ausculation bilaterally, normal work of breathing Heart- Regular rate and rhythm, no murmurs, rubs or gallops, PMI not laterally displaced GI- soft, NT, ND, + BS Extremities- no clubbing, cyanosis, or edema MS- no significant deformity or atrophy Skin- no rash or lesion, no gluteal ecchymosis or dormity at site of pain Psych- euthymic mood, full affect Neuro- strength and sensation are intact  EKG today reveals sinus rhythm 55 bpm, otherwise normal ekg  Assessment and Plan:

## 2011-02-21 NOTE — Assessment & Plan Note (Signed)
Stable No change required today  

## 2011-02-21 NOTE — Assessment & Plan Note (Signed)
Doing very well s/p afib ablation without recurrence. She is quite reluctant to stop amiodarone at this time. I have convinced her to decrease amiodarone to 100mg  daily.  If no further afib, we will likely stop amiodarone upon return. Continue anticoagulation.

## 2011-02-27 ENCOUNTER — Other Ambulatory Visit: Payer: Self-pay | Admitting: *Deleted

## 2011-02-27 MED ORDER — AMLODIPINE BESYLATE 5 MG PO TABS: 5.0000 mg | ORAL_TABLET | Freq: Every day | ORAL | Status: DC

## 2011-03-01 ENCOUNTER — Encounter: Payer: Medicare Other | Admitting: *Deleted

## 2011-03-04 ENCOUNTER — Ambulatory Visit (INDEPENDENT_AMBULATORY_CARE_PROVIDER_SITE_OTHER): Payer: Medicare Other | Admitting: *Deleted

## 2011-03-04 DIAGNOSIS — I4891 Unspecified atrial fibrillation: Secondary | ICD-10-CM

## 2011-03-07 ENCOUNTER — Encounter: Payer: Self-pay | Admitting: Cardiology

## 2011-03-13 ENCOUNTER — Telehealth: Payer: Self-pay | Admitting: Cardiology

## 2011-03-13 NOTE — Telephone Encounter (Signed)
Telephoned pt and made her aware to keep CVRR appt on Monday because she started on Doxycycline. It has the potential to raise INR thus, she will keep pending appt.

## 2011-03-13 NOTE — Telephone Encounter (Signed)
Anne Hayes from [place] office wanted to let the nurse know that  Pt starting on doxycyline 100 mg twice day for 7 day.

## 2011-03-18 ENCOUNTER — Ambulatory Visit (INDEPENDENT_AMBULATORY_CARE_PROVIDER_SITE_OTHER): Payer: Medicare Other | Admitting: *Deleted

## 2011-03-18 ENCOUNTER — Telehealth: Payer: Self-pay | Admitting: *Deleted

## 2011-03-18 DIAGNOSIS — I4891 Unspecified atrial fibrillation: Secondary | ICD-10-CM

## 2011-03-18 LAB — POCT INR: INR: 1.9

## 2011-03-18 NOTE — Telephone Encounter (Signed)
Patient here for CVRR clinic and stopped by to see Dr Johney Frame  C/O her gait being unsteady  Dr Johney Frame advised to to stop Amiodarone

## 2011-04-01 ENCOUNTER — Ambulatory Visit (INDEPENDENT_AMBULATORY_CARE_PROVIDER_SITE_OTHER): Payer: Medicare Other | Admitting: *Deleted

## 2011-04-01 DIAGNOSIS — I4891 Unspecified atrial fibrillation: Secondary | ICD-10-CM

## 2011-04-22 ENCOUNTER — Ambulatory Visit (INDEPENDENT_AMBULATORY_CARE_PROVIDER_SITE_OTHER): Payer: Medicare Other | Admitting: *Deleted

## 2011-04-22 DIAGNOSIS — I4891 Unspecified atrial fibrillation: Secondary | ICD-10-CM

## 2011-05-01 ENCOUNTER — Ambulatory Visit (INDEPENDENT_AMBULATORY_CARE_PROVIDER_SITE_OTHER): Payer: Medicare Other | Admitting: *Deleted

## 2011-05-01 DIAGNOSIS — I4891 Unspecified atrial fibrillation: Secondary | ICD-10-CM

## 2011-05-09 ENCOUNTER — Telehealth: Payer: Self-pay | Admitting: Pulmonary Disease

## 2011-05-09 NOTE — Telephone Encounter (Signed)
appt has been made for pt on 8-17 at 10:30.  Will call and let pt know.

## 2011-05-10 NOTE — Telephone Encounter (Signed)
Called and spoke with lauren at Dr. Lesia Sago office and she stated that she called to check on the appt for the pt and someone already gave her the info and she has informed the pt.

## 2011-05-13 ENCOUNTER — Encounter: Payer: Medicare Other | Admitting: *Deleted

## 2011-05-14 ENCOUNTER — Other Ambulatory Visit (INDEPENDENT_AMBULATORY_CARE_PROVIDER_SITE_OTHER): Payer: Medicare Other

## 2011-05-14 ENCOUNTER — Ambulatory Visit (INDEPENDENT_AMBULATORY_CARE_PROVIDER_SITE_OTHER): Payer: Medicare Other | Admitting: Pulmonary Disease

## 2011-05-14 ENCOUNTER — Encounter: Payer: Self-pay | Admitting: Pulmonary Disease

## 2011-05-14 DIAGNOSIS — C649 Malignant neoplasm of unspecified kidney, except renal pelvis: Secondary | ICD-10-CM

## 2011-05-14 DIAGNOSIS — J8410 Pulmonary fibrosis, unspecified: Secondary | ICD-10-CM | POA: Insufficient documentation

## 2011-05-14 DIAGNOSIS — I10 Essential (primary) hypertension: Secondary | ICD-10-CM

## 2011-05-14 DIAGNOSIS — F411 Generalized anxiety disorder: Secondary | ICD-10-CM

## 2011-05-14 DIAGNOSIS — I4891 Unspecified atrial fibrillation: Secondary | ICD-10-CM

## 2011-05-14 DIAGNOSIS — E7800 Pure hypercholesterolemia, unspecified: Secondary | ICD-10-CM

## 2011-05-14 DIAGNOSIS — M1990 Unspecified osteoarthritis, unspecified site: Secondary | ICD-10-CM

## 2011-05-14 DIAGNOSIS — R05 Cough: Secondary | ICD-10-CM

## 2011-05-14 DIAGNOSIS — I679 Cerebrovascular disease, unspecified: Secondary | ICD-10-CM

## 2011-05-14 DIAGNOSIS — K219 Gastro-esophageal reflux disease without esophagitis: Secondary | ICD-10-CM

## 2011-05-14 LAB — CBC WITH DIFFERENTIAL/PLATELET
Basophils Relative: 0.6 % (ref 0.0–3.0)
Eosinophils Absolute: 0.2 10*3/uL (ref 0.0–0.7)
Lymphocytes Relative: 20.5 % (ref 12.0–46.0)
MCHC: 33.5 g/dL (ref 30.0–36.0)
MCV: 92.9 fl (ref 78.0–100.0)
Monocytes Absolute: 0.6 10*3/uL (ref 0.1–1.0)
Neutrophils Relative %: 65.5 % (ref 43.0–77.0)
Platelets: 176 10*3/uL (ref 150.0–400.0)
RBC: 4.35 Mil/uL (ref 3.87–5.11)
WBC: 5.9 10*3/uL (ref 4.5–10.5)

## 2011-05-14 MED ORDER — FIRST-DUKES MOUTHWASH MT SUSP: OROMUCOSAL | Status: DC

## 2011-05-14 MED ORDER — HYDROCOD POLST-CHLORPHEN POLST 10-8 MG/5ML PO LQCR: 5.0000 mL | Freq: Two times a day (BID) | ORAL | Status: DC

## 2011-05-14 NOTE — Progress Notes (Signed)
Subjective:    Patient ID: Anne Hayes, female    DOB: 07-Dec-1923, 77 y.o.   MRN: [id number]  HPI 77 y/o WF here for a follow up visit... she has multiple medical problems as noted below...   ~  November 15, 2009:  she had f/u right renal lesion by Urology w/ slow growth evident & Bx 4/10 showed Renal Cell Ca- clear cell type... decision was made betw DrBorden & [name] to try radiofreq ablation & this was done on 01/06/09 & 08/18/09... f/u CT 12/10 showed large ablation defect in right kidney- no residual enhancement (?sm nodule RLL to be followed as well)... she remains on Flecanide Rx from [name]- stable... her CC is intermittent hoarseness & "can't clear my throat"... her exam is neg & we discussed Rx w/ Mucinex, MMW, & ENT eval to get a look at her cords...  ~  May 14, 2011:  61mo ROV & referred by [name] for f/u of her chronic throat symptoms, cough, & interstitial lung disease on CXR> currently c/o cough x 29yr she says but really c/o clearing her throat a lot & these symptoms go back at least 70yrs w/ eval by ENT 2/11 DrRosen> laryngoscopy showed some edema & clinically felt to have LER & Rx w/ Omep 40mg /d but she was not regular w/ it & states it didn't help;  She had some mild basilar interstial changes on CXR back then but sl incr currently (1&1/2 yrs later), also has been on Amiodarone since 11/11 for difficult AFib...    She has had a very difficult time w/ her AFib trated by [name] & [name]> she had been tried on Flecainide, Sotalol, etc but either intol or ineffective; finally started on Amiodarone 11/11 & had 2 separate DCCV attempts but it didn't hold;  Finally had EPstudies by [name] w2/ RF ablation & has been holding NSR since then & the Amio was discontinued...    More recent eval from [name] for cough, assoc wheezing, hx bronchitis w/ CXR showing basilar ILD & no improvement after several rounds of antibiotics; interesting that she had run out of her Prevacid rx  for GERD...    >> CXR 7/12 from [name] showed chronic interstitial markings towards the lower lobes & atherosclerosis in Ao...    >> We discussed the need for further eval w/ Collagen-Vasc screen, PFT, CT Chest;  If everything shows as expected she will need a vigorous Antireflux regimen to treat this condition   Current Problems:   Hx of COUGH, THROAT CLEARING SYMPTOMS, & BASILAR INTERSTITIAL FIBROSIS >> hx intermittent and recurrent croupy cough & throat symptoms- reflux related> we discussed maximum antireflux regimen w/ PPI Bid, NPO after dinner, elev HOB 6" etc... ~  baseline CXR showed incr markings c/w mild fibrosis... ~  CXR 2/11 showed cardiomeg, & diffuse chr interstitial opac, osteopenia, NAD.[name]. ~  Referred back 8/12 by DrStonking w/ recurrent symptoms likely related to worsening subclinical reflux...  HYPERTENSION (ICD-401.9) - on Amlodipine & Pindolol per [name]...  ATRIAL FIBRILLATION (ICD-427.31) - long hx of tachy palpit> On [facility]IN [facility]; she is followed by [name] & [name] for LeB Cards. ~  She was tried on Flecainide & Sotalol but INTOL or ineffective;  Started on Amiodarone 11/11 but 2 unsuccessful cardioversions;  Finally had EP study & RF ablation & she has been holding NSR & off Amiodarone...  CEREBROVASCULAR DISEASE (ICD-437.9) - she had a neuro eval 2008 by DrWillis w/ hx of diplopia... ~  MRI showed  some atrophy & small vessel disease...  HYPERCHOLESTEROLEMIA (ICD-272.0) - on PRAVASTATIN 80mg /d,  ZETIA 10mg /d,  FISH OIL 1000mg Bid... Labs followed by [name]...  GERD (ICD-530.81) - last EGD was 1998 by [facility] showing small gastric ulcer, neg HPylori, Rx PPI.   CONSTIPATION (ICD-564.00) - last colonoscopy 11/07 by DrDBrodie was WNL- no divertics, polyps, etc... constip Rx w/ Miralax/ Senakot OTC... but she claims severe reaction to Miralax(?)...  RENAL CELL CANCER (ICD-189.0) & UNSPECIFIED DISORDER OF KIDNEY AND URETER  (ICD-593.9) - she has been followed by DrHumphries & last note 10/08... she has urge > stress incont w/ unacceptable side effects from Detrol, Ditropan, Enablex, Veiscare... she had prev bladder tac from DrMcPhail, & Raz Urethropexy from DrTannenbaum... she has a left renal cyst- 4-74mm size, no changes from 2000 & followed by sonar... incidental finding of an 18mm enhancing lesion in right kidney, mid to lower pole- seen on CT Abd 9/08 (concern for small renal cell ca)... plan was for f/u sonar Q464mo to determine the growth rate... ~  11/10: pt had percutaneous cryoablation of the right renal cell ca by [name]> he continues to follow the pt every 64mo...  DEGENERATIVE JOINT DISEASE (ICD-715.90) - hx DJD and LBP treated by [name] and DrKritzer in the past...  ANXIETY DISORDER, GENERALIZED (ICD-300.02)   Past Medical History  Diagnosis Date  . Cough   . Hypertension   . Atrial fibrillation   . Cerebrovascular disease, unspecified   . Hyperlipidemia   . Esophageal reflux   . Unspecified disorder of kidney and ureter   . Malignant neoplasm of kidney, except pelvis   . Osteoarthrosis, unspecified whether generalized or localized, unspecified site   . Generalized anxiety disorder   . H/O: hysterectomy 1977    Past Surgical History  Procedure Date  . Appendectomy   . Cholecystectomy   . Bunionectomies      BILATERAL  . Anterior and posterior vaginal repair 1989    AP REPAIR & RAZ URETHROPEXY  . S/p percutaneous cryoablation of right renal cell cancer 07/2009    by [facility]    Outpatient Encounter Prescriptions as of 05/14/2011  Medication Sig Dispense Refill  . amLODipine (NORVASC) 2.5 MG tablet Take 2.5 mg by mouth daily.        . cetirizine (ZYRTEC) 10 MG tablet Take 10 mg by mouth daily.        . Cholecalciferol (VITAMIN D3) 1000 UNITS tablet Take 1,000 Units by mouth daily.        [name] ezetimibe (ZETIA) 10 MG tablet Take 10 mg by mouth daily.        . folic acid (FOLVITE) 400  MCG tablet Take 400 mcg by mouth daily.        . pindolol (VISKEN) 5 MG tablet Take 5 mg by mouth 2 (two) times daily.        . pravastatin (PRAVACHOL) 80 MG tablet Take 80 mg by mouth daily.        . sodium chloride (OCEAN) 0.65 % nasal spray Place 2 sprays into the nose as needed.        . vitamin B-12 (CYANOCOBALAMIN) 1000 MCG tablet Take 1,000 mcg by mouth daily.        [name] warfarin (COUMADIN) 5 MG tablet Take by mouth as directed.        . zolpidem (AMBIEN) 10 MG tablet Take 10 mg by mouth at bedtime as needed.          Allergies  Allergen Reactions  .  Codeine     REACTION: nausea/vomiting  . Penicillins     REACTION: nausea/vomiting    Current Medications, Allergies, Past Medical History, Past Surgical History, Family History, and Social History were reviewed in [employer] record.    Review of Systems       See HPI - all other systems neg except as noted...       The patient complains of decreased hearing and dyspnea on exertion.  The patient denies anorexia, fever, weight loss, weight gain, vision loss, hoarseness, chest pain, syncope, peripheral edema, headaches, hemoptysis, abdominal pain, melena, hematochezia, hematuria, incontinence, muscle weakness, suspicious skin lesions, transient blindness, difficulty walking, depression, unusual weight change, abnormal bleeding, enlarged lymph nodes, and angioedema.     Objective:   Physical Exam     WD, WN, 77 y/o WF in NAD... GENERAL:  Alert & oriented; pleasant & cooperative... HEENT:  [place]/AT, EOM-wnl, PERRLA, EACs-clear, TMs-wnl, NOSE-clear, THROAT-clear & wnl, no lesions seen. NECK:  Supple w/ fairROM; no JVD; normal carotid impulses w/o bruits; no thyromegaly or nodules palpated; no lymphadenopathy. CHEST:  Clear to P & A x for dry velcro rales at bases bilat... HEART:  Regular Rhythm; without murmurs/ rubs/ or gallops heard... ABDOMEN:  Soft & nontender; normal bowel sounds; no organomegaly or masses  detected. EXT: without deformities, mod arthritic changes; no varicose veins/ +venous insuffic/ no edema. NEURO:  CN's intact; motor testing normal; sensory testing normal; gait normal & balance OK. DERM:  No lesions noted; no rash etc...  CXR >> 4/12 film showed left basilar scarring & atx, otherw neg...  CT Chest 8/12 >> stable emphysematous changes and basilar fibrotic changes; stable scattered pulm nodularlity w/ no suspicious lesions; aortic atheromatous changes; no signif adenopathy; RF ablation changes to right kidney...  Office Spirometry 8/12 >> FVC= 2.23 (77%), FEV1= 1.81 (88%), %1sec=81, mid-flows= 148%pred >> no signif obstruction, poss mild restriction...  Labs >> WNL and neg collagen-vasc screen (CBC/ BMet= wnl, ANA=neg, RF=<10, Sed=25, ACE=37, IgE<1.5)...   Assessment & Plan:   Chronic Cough, Throat symptoms, etc>  Review of old records here shows long hx throat symptoms, intermittent hoarseness, "can't clear my throat", and cough;  She was prev treated w/ Mucinex, MMW, Antireflux regimen, and ENT eval for her throat & it was all c/w Laryngoesophageal reflux but she never followed up & never stuck w/ a vigorous antireflux regimen...  REC:  PRILOSEC 20mg  Bid taken 30 min before breakfast & dinner;  Don't eat or drink much after dinner to allow stomach to empty completely before bedtime;  Elevate HOB on 6" blocks...  Basilar Interstitial Lung Dis>  This could be mild idiopathic pulm fibrosis, but could also be related to reflux & chr nocturnal asp, or poss to Amiodarone Rx;  The Amio has been discontinued;  She is to start a vigorous antireflux regimen & she needs to be vigilant about it;  And her CXR should be followed Q6-12 month to look for signs of deterioration, worsening fibrosis etc...  With her recent airway symptoms> we will start PULMICORT 2 inhalations Bid...  HBP>  On meds per [name] & well controlled...  AFIB>  On Rx per [name] & [name] & currentrly improved  after RF Ablation & holding NSR (and off Amio)...  CHOL>  On Prav40 n+ Zetia & labs followed by [name]...  GERD>  She has prev seen DrMedoff & may need f/u GI eval as above...  Renal Cell Ca>  S/p percut cryoablation by [name]...  Other  medical problems as noted.Marland KitchenMarland Kitchen

## 2011-05-14 NOTE — Patient Instructions (Signed)
Today we updated your med list in EPIC...  We decided to evaluate your throat & cough symptoms w/ some additional blood work, a CTscan of your chest, and Pulmonary function testing...  We will call you with these results when available...  In the meanwhile> we want to approach your cough from all angles:     1) From the Pulmonary perspective>           You may use the TUSSIONEX 1 tsp twice daily as needed for the cough...           Start the PULMICORT inhaler- 2 inhalations twice daily...           Once we have the data we will see if additional medication is needed...     2) From the throat perspective>           Be careful w/ eating/ swallowing & try to avoid choking on saliva, food, or liquids...           Use the MAGIC MOUTHWASH- 1 tsp gargle & swallow three times daily about an hour after your meals...     3) From the Reflux perspective (LER= laryngoesophageal reflux)>            You need to elevate the head of you bed 6" on blocks...           Do not eat or drink much after dinner in the evening...           Take OTC PRILOSEC 20mg - 30 min before the evening meal...           Take OTC PEPCID 20mg  at bedtime w/ a sip of water...  Call for any questions...  Let's plan a follow up visit in about 4-6 weeks.Marland KitchenMarland Kitchen

## 2011-05-15 LAB — ANA: Anti Nuclear Antibody(ANA): NEGATIVE

## 2011-05-15 LAB — BASIC METABOLIC PANEL
BUN: 19 mg/dL (ref 6–23)
Calcium: 9.4 mg/dL (ref 8.4–10.5)
Chloride: 106 mEq/L (ref 96–112)
Creatinine, Ser: 1 mg/dL (ref 0.4–1.2)

## 2011-05-15 LAB — IGE: IgE (Immunoglobulin E), Serum: <1.5 IU/mL (ref 0.0–180.0)

## 2011-05-15 LAB — RHEUMATOID FACTOR: Rhuematoid fact SerPl-aCnc: <10 IU/mL (ref <=14)

## 2011-05-17 ENCOUNTER — Ambulatory Visit: Payer: Medicare Other | Admitting: Pulmonary Disease

## 2011-05-17 ENCOUNTER — Ambulatory Visit (INDEPENDENT_AMBULATORY_CARE_PROVIDER_SITE_OTHER)
Admission: RE | Admit: 2011-05-17 | Discharge: 2011-05-17 | Disposition: A | Discharge status: Home / Self Care | Payer: Medicare Other | Source: Ambulatory Visit | Attending: Pulmonary Disease | Admitting: Pulmonary Disease

## 2011-05-17 DIAGNOSIS — J8410 Pulmonary fibrosis, unspecified: Secondary | ICD-10-CM

## 2011-05-17 MED ORDER — IOHEXOL 300 MG/ML  SOLN
80.0000 mL | Freq: Once | INTRAMUSCULAR | Status: AC | PRN
  Administered 2011-05-17: 80 mL via INTRAVENOUS

## 2011-05-19 ENCOUNTER — Encounter: Payer: Self-pay | Admitting: Pulmonary Disease

## 2011-05-20 ENCOUNTER — Encounter: Payer: Self-pay | Admitting: Internal Medicine

## 2011-05-20 ENCOUNTER — Ambulatory Visit (INDEPENDENT_AMBULATORY_CARE_PROVIDER_SITE_OTHER): Payer: Medicare Other | Admitting: *Deleted

## 2011-05-20 ENCOUNTER — Ambulatory Visit (INDEPENDENT_AMBULATORY_CARE_PROVIDER_SITE_OTHER): Payer: Medicare Other | Admitting: Internal Medicine

## 2011-05-20 DIAGNOSIS — I495 Sick sinus syndrome: Secondary | ICD-10-CM

## 2011-05-20 DIAGNOSIS — I4891 Unspecified atrial fibrillation: Secondary | ICD-10-CM

## 2011-05-20 MED ORDER — RIVAROXABAN 15 MG PO TABS: 15.0000 mg | ORAL_TABLET | Freq: Every day | ORAL | Status: DC

## 2011-05-20 NOTE — Patient Instructions (Signed)
Your physician recommends that you schedule a follow-up appointment in: 3 months with Dr Daleen Squibb and 6 months with Dr Johney Frame  Stop Coumadin and start Xarelto 15mg  onr daily

## 2011-05-20 NOTE — Assessment & Plan Note (Signed)
Improved asymptomatic 

## 2011-05-20 NOTE — Assessment & Plan Note (Signed)
Doing well s/p ablation Maintaining sinus rhythm off of amiodarone Given CHADS2 score of 2, she should continue anticoagulation  INR today is 1.7 We will stop coumadin and start Xarelto 15mg  daily (CrCl was 50).  She will follow-up with Dr Daleen Squibb in 3 months and I will see her in 6 months

## 2011-05-20 NOTE — Progress Notes (Signed)
The patient presents today for routine electrophysiology followup.  Since last being seen in our clinic, the patient reports doing very well.  She is pleased with the results of her ablation and is maintaining sinus rhythm off of amiodarone.  Today, she denies symptoms of palpitations, chest pain, shortness of breath, orthopnea, PND, lower extremity edema, dizziness, presyncope, syncope, or neurologic sequela.  The patient feels that she is tolerating medications without difficulties and is otherwise without complaint today.    Past Medical History  Diagnosis Date  . Cough   . Hypertension   . Atrial fibrillation   . Cerebrovascular disease, unspecified   . Hyperlipidemia   . Esophageal reflux   . Unspecified disorder of kidney and ureter   . Malignant neoplasm of kidney, except pelvis   . Osteoarthrosis, unspecified whether generalized or localized, unspecified site   . Generalized anxiety disorder   . H/O: hysterectomy 1977   Past Surgical History  Procedure Date  . Appendectomy   . Cholecystectomy   . Bunionectomies      BILATERAL  . Anterior and posterior vaginal repair 1989    AP REPAIR & RAZ URETHROPEXY  . S/p percutaneous cryoablation of right renal cell cancer 07/2009    by [facility]    Current Outpatient Prescriptions  Medication Sig Dispense Refill  . amLODipine (NORVASC) 2.5 MG tablet Take 2.5 mg by mouth daily.        . cetirizine (ZYRTEC) 10 MG tablet Take 10 mg by mouth daily.        . chlorpheniramine-HYDROcodone (TUSSIONEX PENNKINETIC ER) 10-8 MG/5ML LQCR Take 5 mLs by mouth every 12 (twelve) hours.  120 mL  5  . Cholecalciferol (VITAMIN D3) 1000 UNITS tablet Take 1,000 Units by mouth daily.        . Diphenhyd-Hydrocort-Nystatin (FIRST-DUKES MOUTHWASH) SUSP 1 teaspoon gargle and swallow four times daily  120 mL  5  . ezetimibe (ZETIA) 10 MG tablet Take 10 mg by mouth daily.        . folic acid (FOLVITE) 400 MCG tablet Take 400 mcg by mouth daily.        .  pindolol (VISKEN) 5 MG tablet Take 5 mg by mouth 2 (two) times daily.        . pravastatin (PRAVACHOL) 80 MG tablet Take 80 mg by mouth daily.        . sodium chloride (OCEAN) 0.65 % nasal spray Place 2 sprays into the nose as needed.        . vitamin B-12 (CYANOCOBALAMIN) 1000 MCG tablet Take 1,000 mcg by mouth daily.        [name] warfarin (COUMADIN) 5 MG tablet Take by mouth as directed.        . zolpidem (AMBIEN) 10 MG tablet Take 10 mg by mouth at bedtime as needed.          Allergies  Allergen Reactions  . Codeine     REACTION: nausea/vomiting  . Penicillins     REACTION: nausea/vomiting    History   Social History  . Marital Status: Widowed    Spouse Name: N/A    Number of Children: N/A  . Years of Education: N/A   Occupational History  . RETIRED    Social History Main Topics  . Smoking status: Former Smoker -- 1.0 packs/day for 50 years    Types: Cigarettes    Quit date: 09/30/1988  . Smokeless tobacco: Not on file  . Alcohol Use: Yes     glass  of wine with dinner  . Drug Use: No  . Sexually Active: Not on file   Other Topics Concern  . Not on file   Social History Narrative   [facility] YESNO DRUG USERETIRED    Family History  Problem Relation Age of Onset  . Heart disease Father    Physical Exam: Filed Vitals:   05/20/11 1416  BP: 124/68  Pulse: 63  Height: 5\' 6"  (1.676 m)  Weight: 142 lb 12.8 oz (64.774 kg)    GEN- The patient is well appearing, alert and oriented x 3 today.   Head- normocephalic, atraumatic Eyes-  Sclera clear, conjunctiva pink Ears- hearing intact Oropharynx- clear Neck- supple, no JVP Lymph- no cervical lymphadenopathy Lungs- Clear to ausculation bilaterally, normal work of breathing Heart- Regular rate and rhythm, no murmurs, rubs or gallops, PMI not laterally displaced GI- soft, NT, ND, + BS Extremities- no clubbing, cyanosis, or edema MS- no significant deformity or atrophy Skin- no rash or lesion, no  gluteal ecchymosis or dormity at site of pain Psych- euthymic mood, full affect Neuro- strength and sensation are intact  EKG today reveals sinus rhythm 63 bpm, otherwise normal ekg  Assessment and Plan:

## 2011-06-11 ENCOUNTER — Encounter: Payer: Medicare Other | Admitting: *Deleted

## 2011-06-11 ENCOUNTER — Ambulatory Visit: Payer: Medicare Other | Admitting: Pulmonary Disease

## 2011-06-17 ENCOUNTER — Ambulatory Visit: Payer: Medicare Other | Admitting: Pulmonary Disease

## 2011-06-18 ENCOUNTER — Encounter: Payer: Medicare Other | Admitting: *Deleted

## 2011-06-20 ENCOUNTER — Ambulatory Visit (INDEPENDENT_AMBULATORY_CARE_PROVIDER_SITE_OTHER): Payer: Medicare Other | Admitting: *Deleted

## 2011-06-20 DIAGNOSIS — Z7901 Long term (current) use of anticoagulants: Secondary | ICD-10-CM | POA: Insufficient documentation

## 2011-06-20 DIAGNOSIS — I4891 Unspecified atrial fibrillation: Secondary | ICD-10-CM

## 2011-06-20 LAB — POCT INR: INR: 2.2

## 2011-07-12 ENCOUNTER — Other Ambulatory Visit: Payer: Self-pay | Admitting: Pulmonary Disease

## 2011-07-12 ENCOUNTER — Telehealth: Payer: Self-pay | Admitting: Internal Medicine

## 2011-07-12 NOTE — Telephone Encounter (Signed)
Pt called she said med that was replacement for coumadin that dr allred wanted her to try is too expensive and her ins did not cover it please call

## 2011-07-12 NOTE — Telephone Encounter (Signed)
Returned call to patient and left message for her letting  her know I would forward this information to Dr Johney Frame.I let her know I have not received anything from the drug store for prior-authorization

## 2011-07-15 NOTE — Telephone Encounter (Signed)
If she is unable to afford xarelto, she should resume coumadin.

## 2011-07-16 NOTE — Telephone Encounter (Signed)
I have recommended that she restart her Coumadin and get an appointment with [facility] this week to adjust her dose  I told her I never got a call from the drug store requesting a prior authorization for the medication  I will forward to Weston Brass, Pharm D to get back into clinic

## 2011-07-17 NOTE — Telephone Encounter (Signed)
Spoke with pt.  She has restarted her Coumadin.  Appt made to recheck INR on 10/19

## 2011-07-18 ENCOUNTER — Encounter: Payer: Medicare Other | Admitting: *Deleted

## 2011-07-19 ENCOUNTER — Ambulatory Visit (INDEPENDENT_AMBULATORY_CARE_PROVIDER_SITE_OTHER): Payer: Medicare Other | Admitting: *Deleted

## 2011-07-19 DIAGNOSIS — I4891 Unspecified atrial fibrillation: Secondary | ICD-10-CM

## 2011-07-19 DIAGNOSIS — Z7901 Long term (current) use of anticoagulants: Secondary | ICD-10-CM

## 2011-07-19 LAB — POCT INR: INR: 1.1

## 2011-07-23 ENCOUNTER — Encounter: Payer: Self-pay | Admitting: Pulmonary Disease

## 2011-07-23 ENCOUNTER — Encounter: Payer: Medicare Other | Admitting: *Deleted

## 2011-07-23 ENCOUNTER — Ambulatory Visit (INDEPENDENT_AMBULATORY_CARE_PROVIDER_SITE_OTHER): Payer: Medicare Other | Admitting: Pulmonary Disease

## 2011-07-23 DIAGNOSIS — J8410 Pulmonary fibrosis, unspecified: Secondary | ICD-10-CM

## 2011-07-23 DIAGNOSIS — R413 Other amnesia: Secondary | ICD-10-CM

## 2011-07-23 DIAGNOSIS — C649 Malignant neoplasm of unspecified kidney, except renal pelvis: Secondary | ICD-10-CM

## 2011-07-23 DIAGNOSIS — M1990 Unspecified osteoarthritis, unspecified site: Secondary | ICD-10-CM

## 2011-07-23 DIAGNOSIS — K219 Gastro-esophageal reflux disease without esophagitis: Secondary | ICD-10-CM

## 2011-07-23 DIAGNOSIS — I10 Essential (primary) hypertension: Secondary | ICD-10-CM

## 2011-07-23 DIAGNOSIS — I4891 Unspecified atrial fibrillation: Secondary | ICD-10-CM

## 2011-07-23 DIAGNOSIS — R490 Dysphonia: Secondary | ICD-10-CM

## 2011-07-23 DIAGNOSIS — E7800 Pure hypercholesterolemia, unspecified: Secondary | ICD-10-CM

## 2011-07-23 MED ORDER — DONEPEZIL HCL 5 MG PO TABS
5.0000 mg | ORAL_TABLET | Freq: Every day | ORAL | Status: DC
Start: 2011-07-23 — End: 2011-09-01

## 2011-07-23 NOTE — Patient Instructions (Signed)
Today we updated your meds in EPIC...    Continue your current meds the same...  Continue your vigorous antireflux efforts to prevent choking, coughing, etc...  Call for any questions...  le't plan a follow up visit it 4 months or so w/ a CXR at that time.Marland KitchenMarland Kitchen

## 2011-08-02 ENCOUNTER — Ambulatory Visit (INDEPENDENT_AMBULATORY_CARE_PROVIDER_SITE_OTHER): Payer: Medicare Other | Admitting: *Deleted

## 2011-08-02 DIAGNOSIS — Z7901 Long term (current) use of anticoagulants: Secondary | ICD-10-CM

## 2011-08-02 DIAGNOSIS — I4891 Unspecified atrial fibrillation: Secondary | ICD-10-CM

## 2011-08-02 LAB — POCT INR: INR: 1.4

## 2011-08-04 ENCOUNTER — Encounter: Payer: Self-pay | Admitting: Pulmonary Disease

## 2011-08-04 DIAGNOSIS — R413 Other amnesia: Secondary | ICD-10-CM | POA: Insufficient documentation

## 2011-08-04 NOTE — Progress Notes (Signed)
Subjective:    Patient ID: Anne Hayes, female    DOB: [birth year], 77 y.o.   MRN: [id number]  HPI  77 y/o WF here for a follow up visit... she has multiple medical problems as noted below... Now seeing [name] for primary care.  ~  November 15, 2009:  she had f/u right renal lesion by Urology w/ slow growth evident & Bx 4/10 showed Renal Cell Ca- clear cell type... decision was made betw DrBorden & [name] to try radiofreq ablation & this was done on 01/06/09 & 08/18/09... f/u CT 12/10 showed large ablation defect in right kidney- no residual enhancement (?sm nodule RLL to be followed as well)... she remains on Flecanide Rx from [name]- stable... her CC is intermittent hoarseness & "can't clear my throat"... her exam is neg & we discussed Rx w/ Mucinex, MMW, & ENT eval to get a look at her cords...  ~  May 14, 2011:  6mo ROV & referred by [name] for f/u of her chronic throat symptoms, cough, & interstitial lung disease on CXR> currently c/o cough x 70yr she says but really c/o clearing her throat a lot & these symptoms go back at least 40yrs w/ eval by ENT 2/11 DrRosen> laryngoscopy showed some edema & clinically felt to have LER & Rx w/ Omep 40mg /d but she was not regular w/ it & states it didn't help;  She had some mild basilar interstial changes on CXR back then but sl incr currently (1&1/2 yrs later), also has been on Amiodarone since 11/11 for difficult AFib...    She has had a very difficult time w/ her AFib trated by [name] & [name]> she had been tried on Flecainide, Sotalol, etc but either intol or ineffective; finally started on Amiodarone 11/11 & had 2 separate DCCV attempts but it didn't hold;  Finally had EPstudies by [name] w2/ RF ablation & has been holding NSR since then & the Amio was discontinued...    More recent eval from [name] for cough, assoc wheezing, hx bronchitis w/ CXR showing basilar ILD & no improvement after several rounds of antibiotics; interesting  that she had run out of her Prevacid rx for GERD...    >> CXR 7/12 from [name] showed chronic interstitial markings towards the lower lobes & atherosclerosis in Ao...    >> We discussed the need for further eval w/ Collagen-Vasc screen, PFT, CT Chest;  If everything shows as expected she will need a vigorous Antireflux regimen to treat this condition...  ~  July 23, 2011:  66mo ROV & she has mult somatic complaints today> urine has foul odor, no dysuria, rec incr fluids & cranberry juice; c/o concern over her BP (it's 122/66 today), and LBP (chr problem, present for yrs, & has seen [name] & [name]), most recently seen by [facility] w/ adjustments & Tylenol for pain;  Also c/o memory prob & her daughter who accompanies her today wants her on meds, rec to start trial Aricept 5mg /d...    Throat symptoms> on MMW & using it 1-2 per day; she states choking episodes have stopped & throat feels better...    Pulm fibrosis> on Pulmicort & using Tussionex prn; states breathing is stable, at baseline, & she is exercising at the gym 2-3 days per week...    Reflux symptoms> on Prilosec20 before dinner, antireflux regimen, & Zantac Qhs; she denies reflux symptoms and states she is not doing these treatments regularly...          Problem List:  Hx of COUGH, THROAT CLEARING SYMPTOMS, & BASILAR INTERSTITIAL FIBROSIS >> hx intermittent and recurrent croupy cough & throat symptoms- reflux related> we discussed maximum antireflux regimen w/ PPI Bid, NPO after dinner, elev HOB 6" etc... ~  baseline CXR showed incr markings c/w mild fibrosis... ~  CXR 2/11 showed cardiomeg, & diffuse chr interstitial opac, osteopenia, NAD.[name]. ~  CXR 7/12 from [name] showed chronic interstitial markings towards the lower lobes & atherosclerosis in Ao... ~  Referred back 8/12 by DrStonking w/ recurrent symptoms likely related to worsening subclinical reflux... ~  CT Chest 8/12 showed stable emphysematous changes and basilar  fibrotic changes; stable scattered pulm nodularlity w/ no suspicious lesions; aortic atheromatous changes; no signif adenopathy; RF ablation changes to right kidney... ~  PFT 8/12 showed FVC=2.23 (77%), FEV1=1.81 (88%), %1sec=81, mid-flows= 148% pred> c/w mild restriction. ~  Labs 8/12 were WNL and neg collagen-vasc screen (CBC/ BMet= wnl, ANA=neg, RF=<10, Sed=25, ACE=37, IgE<1.5)...  HYPERTENSION (ICD-401.9) - on Amlodipine & ?Pindolol per [name]... ~  10/12:  She did not bring her med list to the OV today...  ATRIAL FIBRILLATION (ICD-427.31) - long hx of tachy palpit> On [facility]ADIN [facility]; she is followed by [name] & [name] for LeB Cards. ~  She was tried on Flecainide & Sotalol but INTOL or ineffective;  Started on Amiodarone 11/11 but 2 unsuccessful cardioversions;  Finally had EP study & RF ablation & she has been holding NSR & off Amiodarone... ~  [name] tried to switch her from Coumadin to Xarelto but it was too $$$ & she switched back  CEREBROVASCULAR DISEASE (ICD-437.9) - she had a neuro eval 2008 by DrWillis w/ hx of diplopia... ~  MRI showed some atrophy & small vessel disease...  HYPERCHOLESTEROLEMIA (ICD-272.0) - on PRAVASTATIN 80mg /d,  ZETIA 10mg /d,  FISH OIL 1000mg Bid... Labs followed by [name]... ~  EPIC EMR labs reviewed, we don't have [name]'s lab results...  GERD (ICD-530.81) - last EGD was 1998 by [facility] showing small gastric ulcer, neg HPylori, Rx PPI.   CONSTIPATION (ICD-564.00) - last colonoscopy 11/07 by DrDBrodie was WNL- no divertics, polyps, etc... constip Rx w/ Miralax/ Senakot OTC... but she claims severe reaction to Miralax(?)...  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 she has urge > stress incont w/ unacceptable side effects from Detrol, Ditropan, Enablex, Veiscare... she had prev bladder tac from DrMcPhail, & Raz  Urethropexy from DrTannenbaum... she has a left renal cyst- 4-70mm size, no changes from 2000 & followed by sonar... incidental finding of an 18mm enhancing lesion in right kidney, mid to lower pole- seen on CT Abd 9/08 (concern for small renal cell ca)... plan was for f/u sonar Q334mo to determine the growth rate... ~  11/10: pt had percutaneous cryoablation of the right renal cell ca by [name]> he continues to follow the pt every 34mo...  DEGENERATIVE JOINT DISEASE (ICD-715.90) - hx DJD and LBP treated by [name] and DrKritzer in the past...  MEMORY LOSS >> she is c/o memory & daughter requests meds to be started so we discussed Aricept 5mg /d for now...  ANXIETY DISORDER, GENERALIZED (ICD-300.02)   Past Medical History  Diagnosis Date  . Cough   . Hypertension   . Atrial fibrillation   . Cerebrovascular disease, unspecified   . Hyperlipidemia   . Esophageal reflux   . Unspecified disorder of kidney and ureter   . Malignant neoplasm of kidney, except  pelvis   . Osteoarthrosis, unspecified whether generalized or localized, unspecified site   . Generalized anxiety disorder   . H/O: hysterectomy 1977    Past Surgical History  Procedure Date  . Appendectomy   . Cholecystectomy   . Bunionectomies      BILATERAL  . Anterior and posterior vaginal repair 1989    AP REPAIR & RAZ URETHROPEXY  . S/p percutaneous cryoablation of right renal cell cancer 07/2009    by [facility]    Outpatient Encounter Prescriptions as of 07/23/2011  Medication Sig Dispense Refill  . amLODipine (NORVASC) 2.5 MG tablet Take 2.5 mg by mouth daily.        . cetirizine (ZYRTEC) 10 MG tablet Take 10 mg by mouth daily.        . chlorpheniramine-HYDROcodone (TUSSIONEX PENNKINETIC ER) 10-8 MG/5ML LQCR Take 5 mLs by mouth every 12 (twelve) hours.  120 mL  5  . Cholecalciferol (VITAMIN D3) 1000 UNITS tablet Take 1,000 Units by mouth daily.        . Diphenhyd-Hydrocort-Nystatin (FIRST-DUKES MOUTHWASH) SUSP 1  teaspoon gargle and swallow four times daily  120 mL  5  . ezetimibe (ZETIA) 10 MG tablet Take 10 mg by mouth daily.        . folic acid (FOLVITE) 400 MCG tablet Take 400 mcg by mouth daily.        . pravastatin (PRAVACHOL) 80 MG tablet Take 80 mg by mouth daily.        . sodium chloride (OCEAN) 0.65 % nasal spray Place 2 sprays into the nose as needed.        . vitamin B-12 (CYANOCOBALAMIN) 1000 MCG tablet Take 1,000 mcg by mouth daily.        [name] warfarin (COUMADIN) 5 MG tablet TAKE AS DIRECTED  45 tablet  1  . zolpidem (AMBIEN) 10 MG tablet Take 10 mg by mouth at bedtime as needed.        . donepezil (ARICEPT) 5 MG tablet Take 1 tablet (5 mg total) by mouth at bedtime.  30 tablet  11  . DISCONTD: Rivaroxaban (XARELTO) 15 MG TABS tablet Take 1 tablet (15 mg total) by mouth daily.  30 tablet  6    Allergies  Allergen Reactions  . Codeine     REACTION: nausea/vomiting  . Penicillins     REACTION: nausea/vomiting    Current Medications, Allergies, Past Medical History, Past Surgical History, Family History, and Social History were reviewed in [employer] record.    Review of Systems       See HPI - all other systems neg except as noted...       The patient complains of decreased hearing and dyspnea on exertion.  The patient denies anorexia, fever, weight loss, weight gain, vision loss, hoarseness, chest pain, syncope, peripheral edema, headaches, hemoptysis, abdominal pain, melena, hematochezia, hematuria, incontinence, muscle weakness, suspicious skin lesions, transient blindness, difficulty walking, depression, unusual weight change, abnormal bleeding, enlarged lymph nodes, and angioedema.     Objective:   Physical Exam     WD, WN, 77 y/o WF in NAD... GENERAL:  Alert & oriented; pleasant & cooperative... HEENT:  [place]/AT, EOM-wnl, PERRLA, EACs-clear, TMs-wnl, NOSE-clear, THROAT-clear & wnl, no lesions seen. NECK:  Supple w/ fairROM; no JVD; normal carotid  impulses w/o bruits; no thyromegaly or nodules palpated; no lymphadenopathy. CHEST:  Clear to P & A x for dry velcro rales at bases bilat... HEART:  Regular Rhythm; without murmurs/ rubs/ or gallops  heard... ABDOMEN:  Soft & nontender; normal bowel sounds; no organomegaly or masses detected. EXT: without deformities, mod arthritic changes; no varicose veins/ +venous insuffic/ no edema. NEURO:  CN's intact; motor testing normal; sensory testing normal; gait normal & balance OK. DERM:  No lesions noted; no rash etc...  CXR >> 4/12 film showed left basilar scarring & atx, otherw neg...  CT Chest 8/12 >> stable emphysematous changes and basilar fibrotic changes; stable scattered pulm nodularlity w/ no suspicious lesions; aortic atheromatous changes; no signif adenopathy; RF ablation changes to right kidney...  Office Spirometry 8/12 >> FVC= 2.23 (77%), FEV1= 1.81 (88%), %1sec=81, mid-flows= 148%pred >> no signif obstruction, poss mild restriction...  Labs >> WNL and neg collagen-vasc screen (CBC/ BMet= wnl, ANA=neg, RF=<10, Sed=25, ACE=37, IgE<1.5)...   Assessment & Plan:   Chronic Cough, Throat symptoms, etc>  Review of old records here shows long hx throat symptoms, intermittent hoarseness, "can't clear my throat", and cough;  She was prev treated w/ Mucinex, MMW, Antireflux regimen, and ENT eval for her throat & it was all c/w Laryngoesophageal reflux but she never followed up & never stuck w/ a vigorous antireflux regimen...  REC:  PRILOSEC 20mg  Bid taken 30 min before breakfast & dinner;  Don't eat or drink much after dinner to allow stomach to empty completely before bedtime;  Elevate HOB on 6" blocks ==> she reports throat symptoms improved on Rx.  Basilar Interstitial Lung Dis>  This could be mild idiopathic pulm fibrosis, but could also be related to reflux & chr nocturnal asp, or poss to Amiodarone Rx;  The Amio has been discontinued;  She is to start a vigorous antireflux regimen & she  needs to be vigilant about it;  And her CXR should be followed Q6-12 month to look for signs of deterioration, worsening fibrosis etc...  With her recent airway symptoms> we will start PULMICORT 2 inhalations Bid ==> stable on rx.  HBP>  On meds per [name] & well controlled...  AFIB>  On Rx per [name] & [name] & currently improved after RF Ablation & holding NSR (and off Amio); she refused Xarelto due to cost & is back on Coumadin.  CHOL>  On Prav40 n+ Zetia & labs followed by [name]...  GERD>  She has prev seen DrMedoff & may need f/u GI eval as above...  Renal Cell Ca>  S/p percut cryoablation by [name]...  MEMORY LOSS>  They would like medication to be started for this problem & given Aricept 5mg /d...  Other medical problems as noted.Marland KitchenMarland Kitchen

## 2011-08-08 ENCOUNTER — Other Ambulatory Visit (HOSPITAL_COMMUNITY): Payer: Self-pay | Admitting: Interventional Radiology

## 2011-08-08 ENCOUNTER — Other Ambulatory Visit: Payer: Self-pay | Admitting: Interventional Radiology

## 2011-08-09 ENCOUNTER — Ambulatory Visit (INDEPENDENT_AMBULATORY_CARE_PROVIDER_SITE_OTHER): Payer: Medicare Other | Admitting: *Deleted

## 2011-08-09 DIAGNOSIS — I4891 Unspecified atrial fibrillation: Secondary | ICD-10-CM

## 2011-08-09 DIAGNOSIS — Z7901 Long term (current) use of anticoagulants: Secondary | ICD-10-CM

## 2011-08-09 LAB — POCT INR: INR: 1.7

## 2011-08-13 ENCOUNTER — Ambulatory Visit: Payer: Medicare Other | Admitting: Cardiology

## 2011-08-14 ENCOUNTER — Other Ambulatory Visit: Payer: Self-pay | Admitting: Cardiology

## 2011-08-14 DIAGNOSIS — I10 Essential (primary) hypertension: Secondary | ICD-10-CM

## 2011-08-20 ENCOUNTER — Other Ambulatory Visit (INDEPENDENT_AMBULATORY_CARE_PROVIDER_SITE_OTHER): Payer: Medicare Other | Admitting: *Deleted

## 2011-08-20 ENCOUNTER — Ambulatory Visit (INDEPENDENT_AMBULATORY_CARE_PROVIDER_SITE_OTHER): Payer: Medicare Other | Admitting: *Deleted

## 2011-08-20 DIAGNOSIS — Z7901 Long term (current) use of anticoagulants: Secondary | ICD-10-CM

## 2011-08-20 DIAGNOSIS — I4891 Unspecified atrial fibrillation: Secondary | ICD-10-CM

## 2011-08-20 DIAGNOSIS — I10 Essential (primary) hypertension: Secondary | ICD-10-CM

## 2011-08-20 LAB — POCT INR: INR: 1.5

## 2011-08-21 LAB — BASIC METABOLIC PANEL
CO2: 27 mEq/L (ref 19–32)
Calcium: 8.9 mg/dL (ref 8.4–10.5)
GFR: 65.78 mL/min (ref >60.00)
Sodium: 140 mEq/L (ref 135–145)

## 2011-08-30 ENCOUNTER — Ambulatory Visit (INDEPENDENT_AMBULATORY_CARE_PROVIDER_SITE_OTHER): Payer: Medicare Other | Admitting: *Deleted

## 2011-08-30 DIAGNOSIS — Z7901 Long term (current) use of anticoagulants: Secondary | ICD-10-CM

## 2011-08-30 DIAGNOSIS — I4891 Unspecified atrial fibrillation: Secondary | ICD-10-CM

## 2011-08-30 LAB — POCT INR: INR: 1.9

## 2011-09-01 ENCOUNTER — Encounter (HOSPITAL_COMMUNITY): Payer: Self-pay

## 2011-09-01 ENCOUNTER — Emergency Department (HOSPITAL_COMMUNITY)
Admission: EM | Admit: 2011-09-01 | Discharge: 2011-09-01 | Disposition: A | Discharge status: Home / Self Care | Payer: Medicare Other | Attending: Emergency Medicine | Admitting: Emergency Medicine

## 2011-09-01 ENCOUNTER — Emergency Department (HOSPITAL_COMMUNITY): Payer: Medicare Other

## 2011-09-01 DIAGNOSIS — Z85528 Personal history of other malignant neoplasm of kidney: Secondary | ICD-10-CM | POA: Insufficient documentation

## 2011-09-01 DIAGNOSIS — E785 Hyperlipidemia, unspecified: Secondary | ICD-10-CM | POA: Insufficient documentation

## 2011-09-01 DIAGNOSIS — R002 Palpitations: Secondary | ICD-10-CM | POA: Insufficient documentation

## 2011-09-01 DIAGNOSIS — K219 Gastro-esophageal reflux disease without esophagitis: Secondary | ICD-10-CM | POA: Insufficient documentation

## 2011-09-01 DIAGNOSIS — R5381 Other malaise: Secondary | ICD-10-CM | POA: Insufficient documentation

## 2011-09-01 DIAGNOSIS — Z7901 Long term (current) use of anticoagulants: Secondary | ICD-10-CM | POA: Insufficient documentation

## 2011-09-01 DIAGNOSIS — R5383 Other fatigue: Secondary | ICD-10-CM | POA: Insufficient documentation

## 2011-09-01 DIAGNOSIS — I4891 Unspecified atrial fibrillation: Secondary | ICD-10-CM | POA: Insufficient documentation

## 2011-09-01 DIAGNOSIS — Z79899 Other long term (current) drug therapy: Secondary | ICD-10-CM | POA: Insufficient documentation

## 2011-09-01 DIAGNOSIS — M1990 Unspecified osteoarthritis, unspecified site: Secondary | ICD-10-CM | POA: Insufficient documentation

## 2011-09-01 DIAGNOSIS — I10 Essential (primary) hypertension: Secondary | ICD-10-CM | POA: Insufficient documentation

## 2011-09-01 DIAGNOSIS — Z8673 Personal history of transient ischemic attack (TIA), and cerebral infarction without residual deficits: Secondary | ICD-10-CM | POA: Insufficient documentation

## 2011-09-01 DIAGNOSIS — R55 Syncope and collapse: Secondary | ICD-10-CM | POA: Insufficient documentation

## 2011-09-01 HISTORY — DX: Gastro-esophageal reflux disease without esophagitis: K21.9

## 2011-09-01 HISTORY — DX: Diaphragmatic hernia without obstruction or gangrene: K44.9

## 2011-09-01 LAB — PROTIME-INR
INR: 1.84 — ABNORMAL HIGH (ref 0.00–1.49)
Prothrombin Time: 21.6 seconds — ABNORMAL HIGH (ref 11.6–15.2)

## 2011-09-01 LAB — POCT I-STAT, CHEM 8
Calcium, Ion: 1.17 mmol/L (ref 1.12–1.32)
Glucose, Bld: 104 mg/dL — ABNORMAL HIGH (ref 70–99)
HCT: 43 % (ref 36.0–46.0)
TCO2: 25 mmol/L (ref 0–100)

## 2011-09-01 LAB — POCT I-STAT TROPONIN I: Troponin i, poc: 0.02 ng/mL (ref 0.00–0.08)

## 2011-09-01 NOTE — ED Notes (Signed)
Patient denies pain and is resting comfortably.  

## 2011-09-01 NOTE — ED Notes (Signed)
Pt states that she felt like she needed to sit down after getting weak.  Pt has converted to NSR at 68 on arrival to ED.  [place] in a retirement community

## 2011-09-01 NOTE — ED Notes (Signed)
Radiology phoned for [facility] CXR

## 2011-09-01 NOTE — ED Notes (Signed)
Patient is resting comfortably. 

## 2011-09-01 NOTE — ED Notes (Signed)
OJ and crackers given to patient, family at bedside.  Pt denies any pain.  Remains in NSR

## 2011-09-01 NOTE — ED Provider Notes (Signed)
History     CSN: [id number] Arrival date & time: 09/01/2011  8:13 AM   First MD Initiated Contact with Patient 09/01/11 [phone]      Chief Complaint  Patient presents with  . Fatigue    felt weak when making breakfast this am    (Consider location/radiation/quality/duration/timing/severity/associated sxs/prior treatment) The history is provided by the patient.     Pt states that she felt like she needed to sit down after getting weak. Pt has converted to NSR at 68 on arrival to ED. Lives in a retirement community.  Patient felt that her heart rate had increased initiated on her blood pressure machine at 140.  She did not experience chest pain but did get lightheaded and fell near syncopal.  By the time she arrived in the emergency department her rate had decreased to normal.  Patient denies diaphoresis, nausea, vomiting.  Past Medical History  Diagnosis Date  . Cough   . Hypertension   . Atrial fibrillation   . Cerebrovascular disease, unspecified   . Hyperlipidemia   . Esophageal reflux   . Unspecified disorder of kidney and ureter   . Malignant neoplasm of kidney, except pelvis   . Osteoarthrosis, unspecified whether generalized or localized, unspecified site   . Generalized anxiety disorder   . H/O: hysterectomy 1977  . GERD (gastroesophageal reflux disease)   . Hiatal hernia     Past Surgical History  Procedure Date  . Appendectomy   . Cholecystectomy   . Bunionectomies      BILATERAL  . Anterior and posterior vaginal repair 1989    AP REPAIR & RAZ URETHROPEXY  . S/p percutaneous cryoablation of right renal cell cancer 07/2009    by [name]    Family History  Problem Relation Age of Onset  . Heart disease Father     History  Substance Use Topics  . Smoking status: Never Smoker   . Smokeless tobacco: Not on file  . Alcohol Use: No     glass of wine with dinner    OB History    Grav Para Term Preterm Abortions TAB SAB Ect Mult Living                  Review of Systems   all other review of systems negative except as mentioned in history of present illness  Allergies  Codeine; Eggs or egg-derived products; Penicillins; and Sulfa antibiotics  Home Medications   Current Outpatient Rx  Name Route Sig Dispense Refill  . AMIODARONE HCL 200 MG PO TABS Oral Take 100 mg by mouth daily.      [name] EZETIMIBE 10 MG PO TABS Oral Take 10 mg by mouth daily.      . WARFARIN SODIUM 5 MG PO TABS  TAKE AS DIRECTED 45 tablet 1  . AMLODIPINE BESYLATE 2.5 MG PO TABS Oral Take 2.5 mg by mouth daily.      [name] CETIRIZINE HCL 10 MG PO TABS Oral Take 10 mg by mouth daily.      [name] HYDROCOD POLST-CHLORPHEN POLST 10-8 MG/5ML PO LQCR Oral Take 5 mLs by mouth every 12 (twelve) hours. 120 mL 5  . VITAMIN D3 1000 UNITS PO TABS Oral Take 1,000 Units by mouth daily.      [name] FIRST-DUKES MOUTHWASH MT SUSP  1 teaspoon gargle and swallow four times daily 120 mL 5  . DONEPEZIL HCL 5 MG PO TABS Oral Take 1 tablet (5 mg total) by mouth at bedtime. 30 tablet 11  .  FOLIC ACID 400 MCG PO TABS Oral Take 400 mcg by mouth daily.      [name] PRAVASTATIN SODIUM 80 MG PO TABS Oral Take 80 mg by mouth daily.      [name] SALINE NASAL SPRAY 0.65 % NA SOLN Nasal Place 2 sprays into the nose as needed.      [name] VITAMIN B-12 1000 MCG PO TABS Oral Take 1,000 mcg by mouth daily.      [name] ZOLPIDEM TARTRATE 10 MG PO TABS Oral Take 10 mg by mouth at bedtime as needed.        BP 138/84  Pulse 66  Temp(Src) 97.7 F (36.5 C) (Oral)  Resp 18  Wt 143 lb (64.864 kg)  SpO2 97%  Physical Exam  Nursing note and vitals reviewed. Constitutional: She is oriented to person, place, and time. She appears well-developed and well-nourished. No distress.  HENT:  Head: Normocephalic and atraumatic.  Eyes: Pupils are equal, round, and reactive to light.  Neck: Normal range of motion.  Cardiovascular: Normal rate and intact distal pulses.          Date: 09/01/2011  Rate: 65  Rhythm: normal sinus rhythm  QRS Axis:  left  Intervals: normal  ST/T Wave abnormalities: normal  Conduction Disutrbances:none:   Old EKG Reviewed: unchanged     Pulmonary/Chest: No respiratory distress.  Abdominal: Normal appearance. She exhibits no distension.  Musculoskeletal: Normal range of motion.  Neurological: She is alert and oriented to person, place, and time. No cranial nerve deficit.  Skin: Skin is warm and dry. No rash noted.  Psychiatric: She has a normal mood and affect. Her behavior is normal.    ED Course  Procedures (including critical care time)  Labs Reviewed  PROTIME-INR - Abnormal; Notable for the following:    Prothrombin Time 21.6 (*)    INR 1.84 (*)    All other components within normal limits  POCT I-STAT, CHEM 8 - Abnormal; Notable for the following:    Glucose, Bld 104 (*)    All other components within normal limits  POCT I-STAT TROPONIN I  I-STAT, CHEM 8  I-STAT TROPONIN I   Dg Chest Portable 1 View  09/01/2011  *RADIOLOGY REPORT*  Clinical Data: Increased heart rate.  Weakness, hypertension.  PORTABLE CHEST - 1 VIEW  Comparison: 05/08/2011  Findings: Heart is normal size.  Chronic linear densities in the lung bases, likely scarring.  No acute opacities or effusions.  No acute bony abnormality.  IMPRESSION: Chronic changes.  No active disease.  Original Report Authenticated By: Cyndie Chime, M.D.     1. Palpitations       MDM         Nelia Shi, MD 09/01/11 [phone]

## 2011-09-01 NOTE — ED Notes (Signed)
Rad at bedside for port CXR

## 2011-09-01 NOTE — ED Notes (Signed)
Vital signs stable. 

## 2011-09-02 ENCOUNTER — Telehealth: Payer: Self-pay | Admitting: Cardiology

## 2011-09-02 NOTE — Telephone Encounter (Signed)
Spoke with pt, she woke with weakness and dizziness. She was able to check her pulse and the bp machine read her pulse as 148. She was transported to [facility] and converted before arriving. Today she feels fine and her pulse is 63. Dr Johney Frame had stopped her amiodarone in august, this is the first episode she has had since her ablation. Will forward for dr wall and dr allred review. The pt will call back with further problems Deliah Goody

## 2011-09-02 NOTE — Telephone Encounter (Signed)
New problem She was er last night. She was in afib. She would like to talk to you please call

## 2011-09-03 ENCOUNTER — Ambulatory Visit
Admission: RE | Admit: 2011-09-03 | Discharge: 2011-09-03 | Disposition: A | Discharge status: Home / Self Care | Payer: Medicare Other | Source: Ambulatory Visit | Attending: Interventional Radiology | Admitting: Interventional Radiology

## 2011-09-03 ENCOUNTER — Ambulatory Visit (HOSPITAL_COMMUNITY)
Admission: RE | Admit: 2011-09-03 | Discharge: 2011-09-03 | Disposition: A | Discharge status: Home / Self Care | Payer: Medicare Other | Source: Ambulatory Visit | Attending: Interventional Radiology | Admitting: Interventional Radiology

## 2011-09-03 DIAGNOSIS — Q619 Cystic kidney disease, unspecified: Secondary | ICD-10-CM | POA: Insufficient documentation

## 2011-09-03 DIAGNOSIS — N289 Disorder of kidney and ureter, unspecified: Secondary | ICD-10-CM | POA: Insufficient documentation

## 2011-09-03 DIAGNOSIS — C649 Malignant neoplasm of unspecified kidney, except renal pelvis: Secondary | ICD-10-CM | POA: Insufficient documentation

## 2011-09-03 MED ORDER — IOHEXOL 300 MG/ML  SOLN
100.0000 mL | Freq: Once | INTRAMUSCULAR | Status: AC | PRN
  Administered 2011-09-03: 100 mL via INTRAVENOUS

## 2011-09-03 NOTE — Progress Notes (Signed)
Pt denies hematuria or any other urinary Sx.  States that she has been doing well.  Recently seen at [facility] ED on 09/01/2011 for tachycardia.  Pt denies any dyspnea or chest pain.   Observed for several hours in ED & discharged.

## 2011-09-04 ENCOUNTER — Other Ambulatory Visit: Payer: Self-pay | Admitting: Interventional Radiology

## 2011-09-04 ENCOUNTER — Other Ambulatory Visit (HOSPITAL_COMMUNITY): Payer: Self-pay | Admitting: Interventional Radiology

## 2011-09-05 ENCOUNTER — Ambulatory Visit (INDEPENDENT_AMBULATORY_CARE_PROVIDER_SITE_OTHER): Payer: Medicare Other | Admitting: Cardiology

## 2011-09-05 ENCOUNTER — Encounter: Payer: Self-pay | Admitting: Cardiology

## 2011-09-05 DIAGNOSIS — I4891 Unspecified atrial fibrillation: Secondary | ICD-10-CM

## 2011-09-05 MED ORDER — DILTIAZEM HCL 60 MG PO TABS: 60.0000 mg | ORAL_TABLET | Freq: Once | ORAL | Status: DC | PRN

## 2011-09-05 NOTE — Progress Notes (Signed)
HPI Anne Hayes comes in today for evaluation and management of recurrent A. Fib this past Sunday.  Number emergency room and she really wasn't short of breath or currently symptomatic. EKG showed atrial fib with a ventricular rate of about 100  Beats per minute.  By the time she arrived to the emergency room, she converted to normal sinus rhythm. EKG demonstrating atrial fib is from the EMS service.  She says that she was a little bit lightheaded. She denied any syncope.  She underwent Atrial ablation about a year ago.  He's very compliant with her medications. He still remains extremely active and looks so much younger than her stated age.  Looking back at her chart she came bradycardic on sotalol.  Past Medical History  Diagnosis Date  . Cough   . Hypertension   . Atrial fibrillation   . Cerebrovascular disease, unspecified   . Hyperlipidemia   . Esophageal reflux   . Unspecified disorder of kidney and ureter   . Malignant neoplasm of kidney, except pelvis   . Osteoarthrosis, unspecified whether generalized or localized, unspecified site   . Generalized anxiety disorder   . H/O: hysterectomy 1977  . GERD (gastroesophageal reflux disease)   . Hiatal hernia     Current Outpatient Prescriptions  Medication Sig Dispense Refill  . Alum & Mag Hydroxide-Simeth (MAGIC MOUTHWASH) SOLN Take 5 mLs by mouth 2 (two) times daily.        [name] amLODipine (NORVASC) 5 MG tablet Take 5 mg by mouth daily.        . Cholecalciferol (VITAMIN D3) 1000 UNITS tablet Take 1,000 Units by mouth daily.        [name] donepezil (ARICEPT) 5 MG tablet Take 5 mg by mouth daily.        [name] ezetimibe (ZETIA) 10 MG tablet Take 10 mg by mouth daily.        . folic acid (FOLVITE) 400 MCG tablet Take 400 mcg by mouth daily.        . pravastatin (PRAVACHOL) 80 MG tablet Take 80 mg by mouth daily.        . promethazine (PHENERGAN) 25 MG tablet Take 12.5 mg by mouth every 6 (six) hours as needed. As needed for nausea.        . sodium chloride (OCEAN) 0.65 % nasal spray Place 2 sprays into the nose as needed.        . traMADol-acetaminophen (ULTRACET) 37.5-325 MG per tablet Take 1 tablet by mouth every 6 (six) hours as needed. As needed for back pain.      [name] warfarin (COUMADIN) 5 MG tablet        . zolpidem (AMBIEN) 10 MG tablet Take 10 mg by mouth at bedtime as needed.        . cephALEXin (KEFLEX) 500 MG capsule Take 500 mg by mouth every 12 (twelve) hours. Filled on 11/26. Take for 8 days.       [name] diltiazem (CARDIZEM) 60 MG tablet Take 1 tablet (60 mg total) by mouth once as needed (Take 1 tab at onset and repeat in 2 hours if still in atrial-fib.).  10 tablet  3    Allergies  Allergen Reactions  . Codeine     REACTION: nausea/vomiting  . Eggs Or Egg-Derived Products Nausea Only  . Penicillins     REACTION: nausea/vomiting  . Sulfa Antibiotics Itching    Family History  Problem Relation Age of Onset  . Heart disease Father  History   Social History  . Marital Status: Widowed    Spouse Name: N/A    Number of Children: N/A  . Years of Education: N/A   Occupational History  . RETIRED    Social History Main Topics  . Smoking status: Never Smoker   . Smokeless tobacco: Not on file  . Alcohol Use: No     glass of wine with dinner  . Drug Use: No  . Sexually Active: No   Other Topics Concern  . Not on file   Social History Narrative   WIDOWEDFORMER SMOKERETOH YESNO DRUG USERETIRED    ROS ALL NEGATIVE EXCEPT THOSE NOTED IN HPI  PE  General Appearance: well developed, well nourished in no acute distress HEENT: symmetrical face, PERRLA, good dentition  Neck: no JVD, thyromegaly, or adenopathy, trachea midline Chest: symmetric without deformity Cardiac: PMI non-displaced, RRR, normal S1, S2, no gallop or murmur Lung: clear to ausculation and percussion Vascular: all pulses full without bruits  Abdominal: nondistended, nontender, good bowel sounds, no HSM, no bruits Extremities: no  cyanosis, clubbing or edema, no sign of DVT, no varicosities  Skin: normal color, no rashes Neuro: alert and oriented x 3, non-focal Pysch: normal affect  EKG  BMET    Component Value Date/Time   NA 142 09/01/2011 0923   K 4.2 09/01/2011 0923   CL 106 09/01/2011 0923   CO2 27 08/20/2011 1517   GLUCOSE 104* 09/01/2011 0923   BUN 18 09/01/2011 0923   CREATININE 1.00 09/01/2011 0923   CALCIUM 8.9 08/20/2011 1517   GFRNONAA 53* 01/08/2011 2129   GFRAA  Value: >60        The eGFR has been calculated using the MDRD equation. This calculation has not been validated in all clinical situations. eGFR's persistently <60 mL/min signify possible Chronic Kidney Disease. 01/08/2011 2129    Lipid Panel     Component Value Date/Time   CHOL 155 02/27/2010 0709   TRIG 206.0* 02/27/2010 0709   HDL 40.90 02/27/2010 0709   CHOLHDL 4 02/27/2010 0709   VLDL 41.2* 02/27/2010 0709   LDLCALC 82 09/08/2008 0924    CBC    Component Value Date/Time   WBC 5.9 05/14/2011 1714   RBC 4.35 05/14/2011 1714   HGB 14.6 09/01/2011 0923   HCT 43.0 09/01/2011 0923   PLT 176.0 05/14/2011 1714   MCV 92.9 05/14/2011 1714   MCH 30.0 01/08/2011 2129   MCHC 33.5 05/14/2011 1714   RDW 14.0 05/14/2011 1714   LYMPHSABS 1.2 05/14/2011 1714   MONOABS 0.6 05/14/2011 1714   EOSABS 0.2 05/14/2011 1714   BASOSABS 0.0 05/14/2011 1714

## 2011-09-05 NOTE — Assessment & Plan Note (Signed)
Recurrent nearly one year out from ablation. With her history of significant bradycardia with beta blockers, and her failure on amiodarone, I have given her 60 mg short-acting diltiazem to take p.r.n. To try to keep her from going too fast, becoming symptomatic, and having to go to the emergency room.  I scheduled appointment Dr. Johney Frame. Further plans per him.

## 2011-09-05 NOTE — Patient Instructions (Signed)
Your physician recommends that you schedule a follow-up appointment as soon as possible with Dr. Johney Frame.  Take Diltiazem 60mg  1 by mouth at the onset of atrial-fib and repeat in 2 hours if still in atrial fib.  Please call our office at [phone] if atrial fib persists.

## 2011-09-11 NOTE — Telephone Encounter (Signed)
Pt seen by Dr Daleen Squibb and prescribed prn diltiazem which is reasonable. She did well previously on amiodarone. IF her afib returns, we will consider restarting amiodarone.

## 2011-09-12 ENCOUNTER — Ambulatory Visit (INDEPENDENT_AMBULATORY_CARE_PROVIDER_SITE_OTHER): Payer: Medicare Other | Admitting: *Deleted

## 2011-09-12 DIAGNOSIS — Z7901 Long term (current) use of anticoagulants: Secondary | ICD-10-CM

## 2011-09-12 DIAGNOSIS — I4891 Unspecified atrial fibrillation: Secondary | ICD-10-CM

## 2011-09-13 ENCOUNTER — Encounter: Payer: Medicare Other | Admitting: *Deleted

## 2011-10-01 DIAGNOSIS — D126 Benign neoplasm of colon, unspecified: Secondary | ICD-10-CM

## 2011-10-01 HISTORY — DX: Benign neoplasm of colon, unspecified: D12.6

## 2011-10-09 ENCOUNTER — Ambulatory Visit (INDEPENDENT_AMBULATORY_CARE_PROVIDER_SITE_OTHER): Payer: Medicare Other | Admitting: Internal Medicine

## 2011-10-09 ENCOUNTER — Encounter: Payer: Self-pay | Admitting: Internal Medicine

## 2011-10-09 ENCOUNTER — Ambulatory Visit (INDEPENDENT_AMBULATORY_CARE_PROVIDER_SITE_OTHER): Payer: Medicare Other | Admitting: *Deleted

## 2011-10-09 VITALS — Ht 66.0 in | Wt 145.8 lb

## 2011-10-09 DIAGNOSIS — I4891 Unspecified atrial fibrillation: Secondary | ICD-10-CM

## 2011-10-09 DIAGNOSIS — Z7901 Long term (current) use of anticoagulants: Secondary | ICD-10-CM

## 2011-10-09 NOTE — Progress Notes (Signed)
The patient presents today for routine electrophysiology followup.  Since last being seen in our clinic, the patient reports doing very well.  She did have one episode of self limited afib in early December.  She states that this occurred after she "over did it" doing house work.  She has had no further episodes since that time. Today, she denies symptoms of palpitations, chest pain, shortness of breath, orthopnea, PND, lower extremity edema, dizziness, presyncope, syncope, or neurologic sequela.  The patient feels that she is tolerating medications without difficulties and is otherwise without complaint today.    Past Medical History  Diagnosis Date  . Cough   . Hypertension   . Atrial fibrillation   . Cerebrovascular disease, unspecified   . Hyperlipidemia   . Esophageal reflux   . Unspecified disorder of kidney and ureter   . Malignant neoplasm of kidney, except pelvis   . Osteoarthrosis, unspecified whether generalized or localized, unspecified site   . Generalized anxiety disorder   . H/O: hysterectomy 1977  . GERD (gastroesophageal reflux disease)   . Hiatal hernia    Past Surgical History  Procedure Date  . Appendectomy   . Cholecystectomy   . Bunionectomies      BILATERAL  . Anterior and posterior vaginal repair 1989    AP REPAIR & RAZ URETHROPEXY  . S/p percutaneous cryoablation of right renal cell cancer 07/2009    by [facility]    Current Outpatient Prescriptions  Medication Sig Dispense Refill  . Alum & Mag Hydroxide-Simeth (MAGIC MOUTHWASH) SOLN Take 5 mLs by mouth 2 (two) times daily.        [name] amLODipine (NORVASC) 5 MG tablet Take 5 mg by mouth daily.        . cephALEXin (KEFLEX) 500 MG capsule Take 500 mg by mouth every 12 (twelve) hours. Filled on 11/26. Take for 8 days.       . Cholecalciferol (VITAMIN D3) 1000 UNITS tablet Take 1,000 Units by mouth daily.        [name] donepezil (ARICEPT) 5 MG tablet Take 5 mg by mouth daily.        [name] ezetimibe (ZETIA) 10 MG tablet  Take 10 mg by mouth daily.        . folic acid (FOLVITE) 400 MCG tablet Take 400 mcg by mouth daily.        . pravastatin (PRAVACHOL) 80 MG tablet Take 80 mg by mouth daily.        . promethazine (PHENERGAN) 25 MG tablet Take 12.5 mg by mouth every 6 (six) hours as needed. As needed for nausea.      . sodium chloride (OCEAN) 0.65 % nasal spray Place 2 sprays into the nose as needed.        . warfarin (COUMADIN) 5 MG tablet        . zolpidem (AMBIEN) 10 MG tablet Take 10 mg by mouth at bedtime as needed.        . diltiazem (CARDIZEM) 60 MG tablet Take 1 tablet (60 mg total) by mouth once as needed (Take 1 tab at onset and repeat in 2 hours if still in atrial-fib.).  10 tablet  3    Allergies  Allergen Reactions  . Codeine     REACTION: nausea/vomiting  . Eggs Or Egg-Derived Products Nausea Only  . Penicillins     REACTION: nausea/vomiting  . Sulfa Antibiotics Itching    History   Social History  . Marital Status: Widowed    Spouse  Name: N/A    Number of Children: N/A  . Years of Education: N/A   Occupational History  . RETIRED    Social History Main Topics  . Smoking status: Former Smoker -- 1.0 packs/day for 50 years    Types: Cigarettes    Quit date: 09/30/1988  . Smokeless tobacco: Not on file  . Alcohol Use: No     glass of wine with dinner  . Drug Use: No  . Sexually Active: No   Other Topics Concern  . Not on file   Social History Narrative   [employer] [facility] YESNO DRUG USERETIRED    Family History  Problem Relation Age of Onset  . Heart disease Father    Physical Exam: Filed Vitals:   10/09/11 1621  Height: 5\' 6"  (1.676 m)  Weight: 145 lb 12.8 oz (66.134 kg)    GEN- The patient is well appearing, alert and oriented x 3 today.   Head- normocephalic, atraumatic Eyes-  Sclera clear, conjunctiva pink Ears- hearing intact Oropharynx- clear Neck- supple, no JVP Lymph- no cervical lymphadenopathy Lungs- Clear to ausculation bilaterally, normal  work of breathing Heart- Regular rate and rhythm, no murmurs, rubs or gallops, PMI not laterally displaced GI- soft, NT, ND, + BS Extremities- no clubbing, cyanosis, or edema  EKG today reveals sinus rhythm 63 bpm, otherwise normal ekg  Assessment and Plan:

## 2011-10-09 NOTE — Patient Instructions (Signed)
Your physician wants you to follow-up in:  6 months. You will receive a reminder letter in the mail two months in advance. If you don't receive a letter, please call our office to schedule the follow-up appointment.   

## 2011-10-09 NOTE — Assessment & Plan Note (Signed)
Doing very well s/p ablation with only one short episode of afib in early December. No changes at this time Continue coumadin. I agree with Dr Anola Gurney addition of cardizem as an as needed agent for rate control  Return in 6 months.  She will contact me if she has further afib.

## 2011-10-17 IMAGING — CR DG CHEST 2V
2 series · 2 of 2 positions shown · non-contrast
Comparison: Chest x-ray 08/03/2010.

CLINICAL DATA: Chest pain.

CHEST - 2 VIEW

[w chest pa]
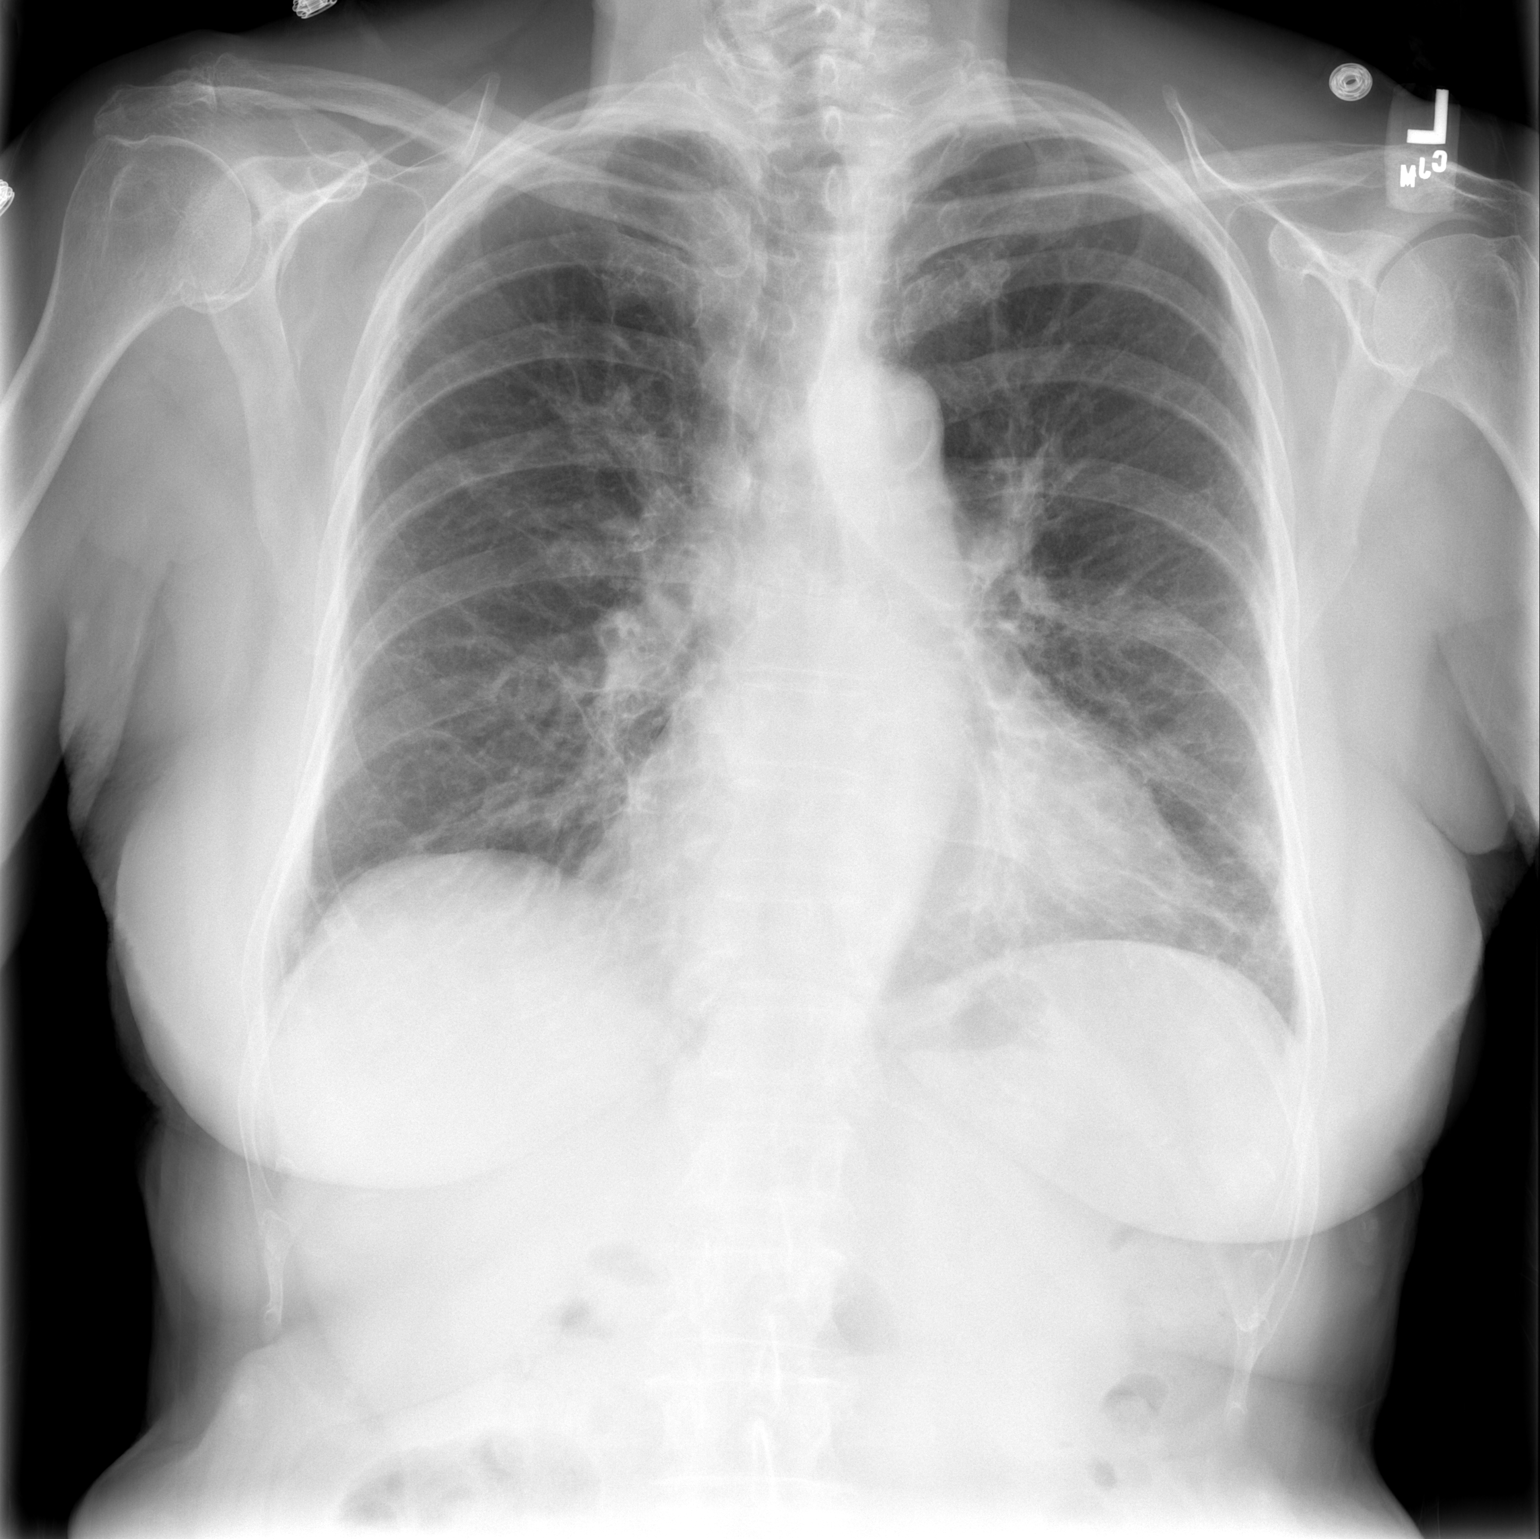

[w chest lat]
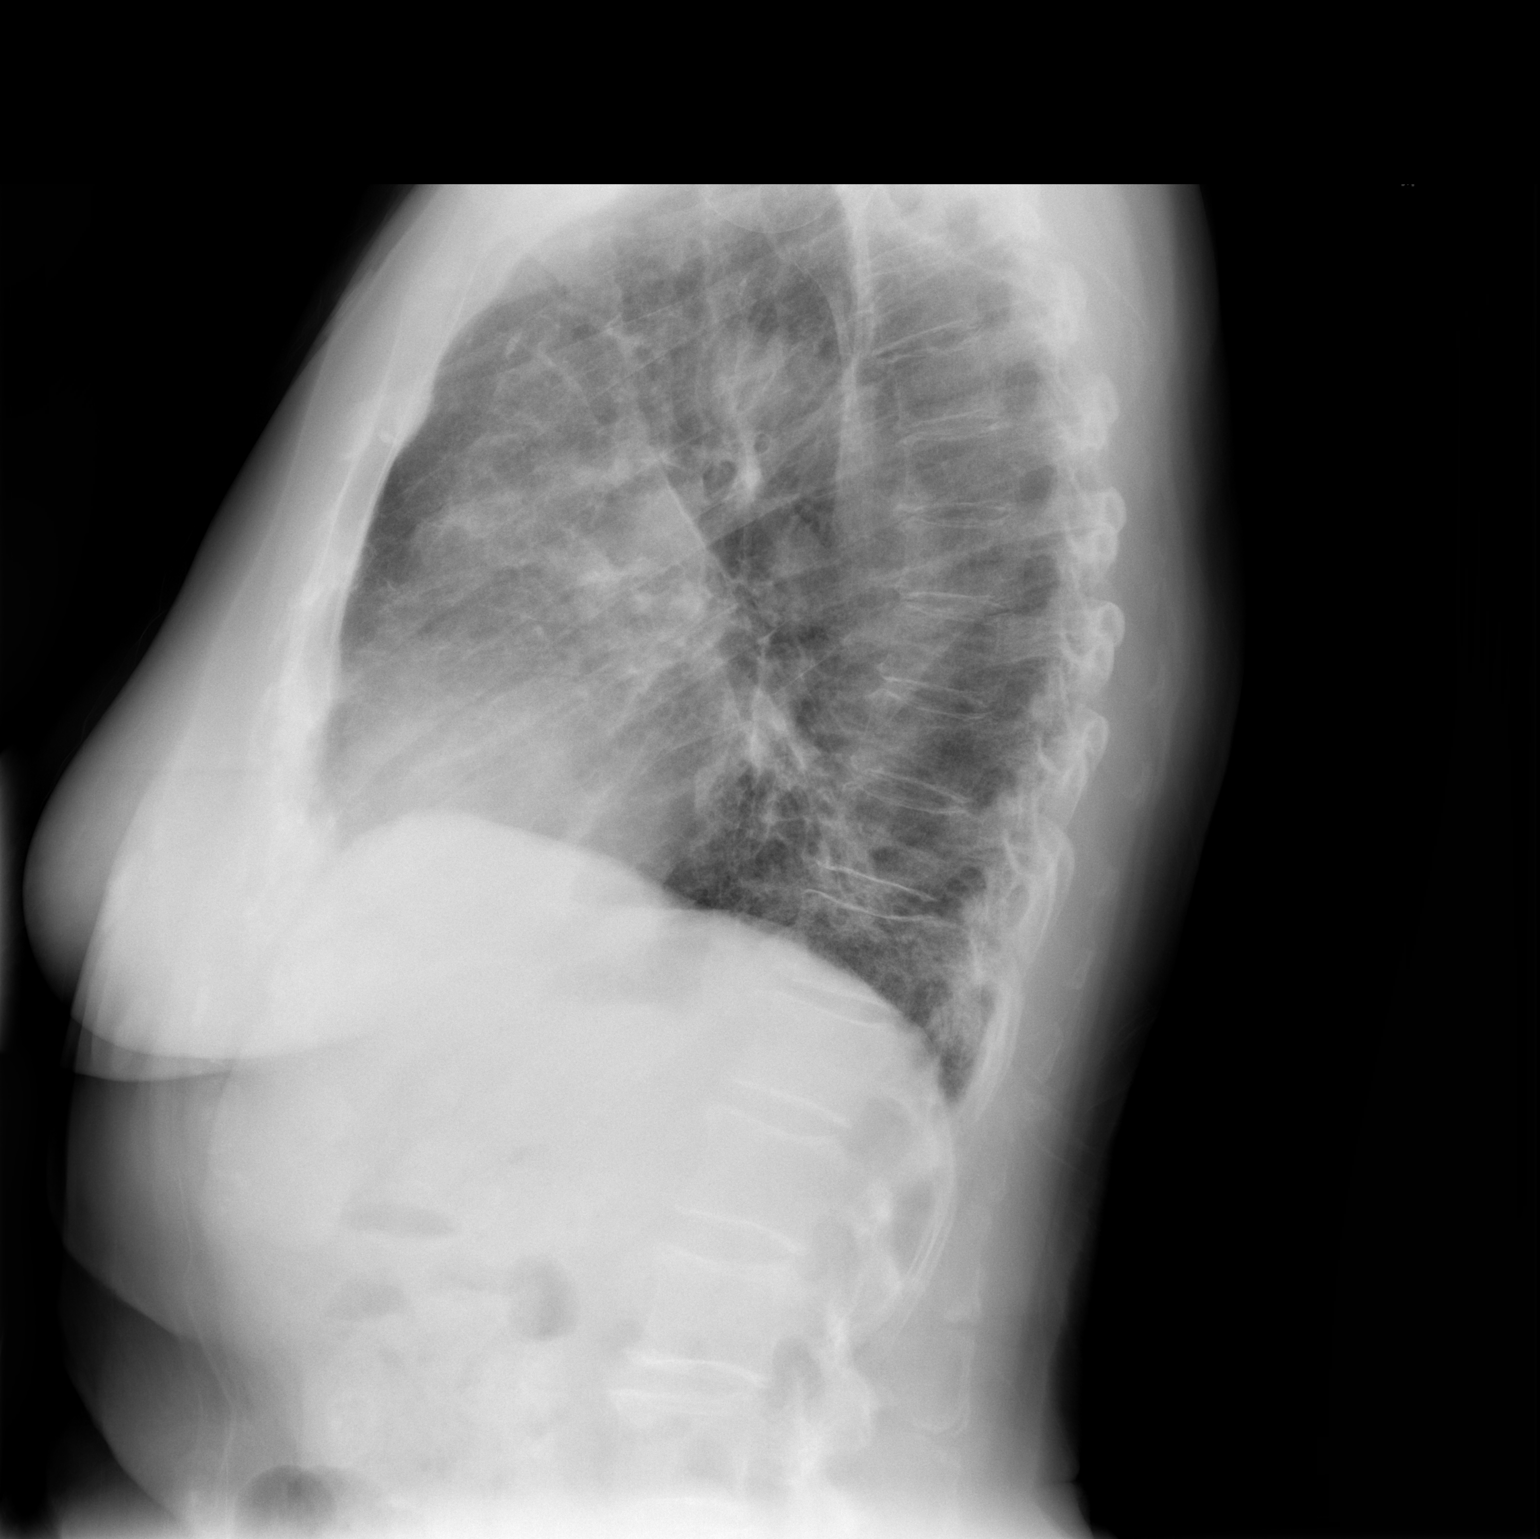

[2 of 2 positions shown; findings below may reference images not displayed]

FINDINGS: The cardiac silhouette, mediastinal and hilar contours
are within normal limits and stable.  There are chronic lung
changes/COPD but no definite acute overlying pulmonary findings.
The bony thorax is intact.
IMPRESSION: Chronic bronchitic and emphysematous changes without acute
pulmonary process.

## 2011-10-19 ENCOUNTER — Other Ambulatory Visit: Payer: Self-pay | Admitting: Pulmonary Disease

## 2011-10-25 ENCOUNTER — Ambulatory Visit (INDEPENDENT_AMBULATORY_CARE_PROVIDER_SITE_OTHER): Payer: Medicare Other

## 2011-10-25 DIAGNOSIS — S058X9A Other injuries of unspecified eye and orbit, initial encounter: Secondary | ICD-10-CM

## 2011-10-25 DIAGNOSIS — H5710 Ocular pain, unspecified eye: Secondary | ICD-10-CM

## 2011-11-05 ENCOUNTER — Ambulatory Visit (INDEPENDENT_AMBULATORY_CARE_PROVIDER_SITE_OTHER): Payer: Medicare Other | Admitting: *Deleted

## 2011-11-05 DIAGNOSIS — I4891 Unspecified atrial fibrillation: Secondary | ICD-10-CM

## 2011-11-05 DIAGNOSIS — Z7901 Long term (current) use of anticoagulants: Secondary | ICD-10-CM

## 2011-11-06 ENCOUNTER — Encounter: Payer: Medicare Other | Admitting: *Deleted

## 2011-11-09 IMAGING — CR DG CHEST 1V PORT
1 series · 1 of 1 positions shown · non-contrast
Comparison: Chest radiograph 12/06/2010

CLINICAL DATA: Chest pain

PORTABLE CHEST - 1 VIEW

[view not recorded]
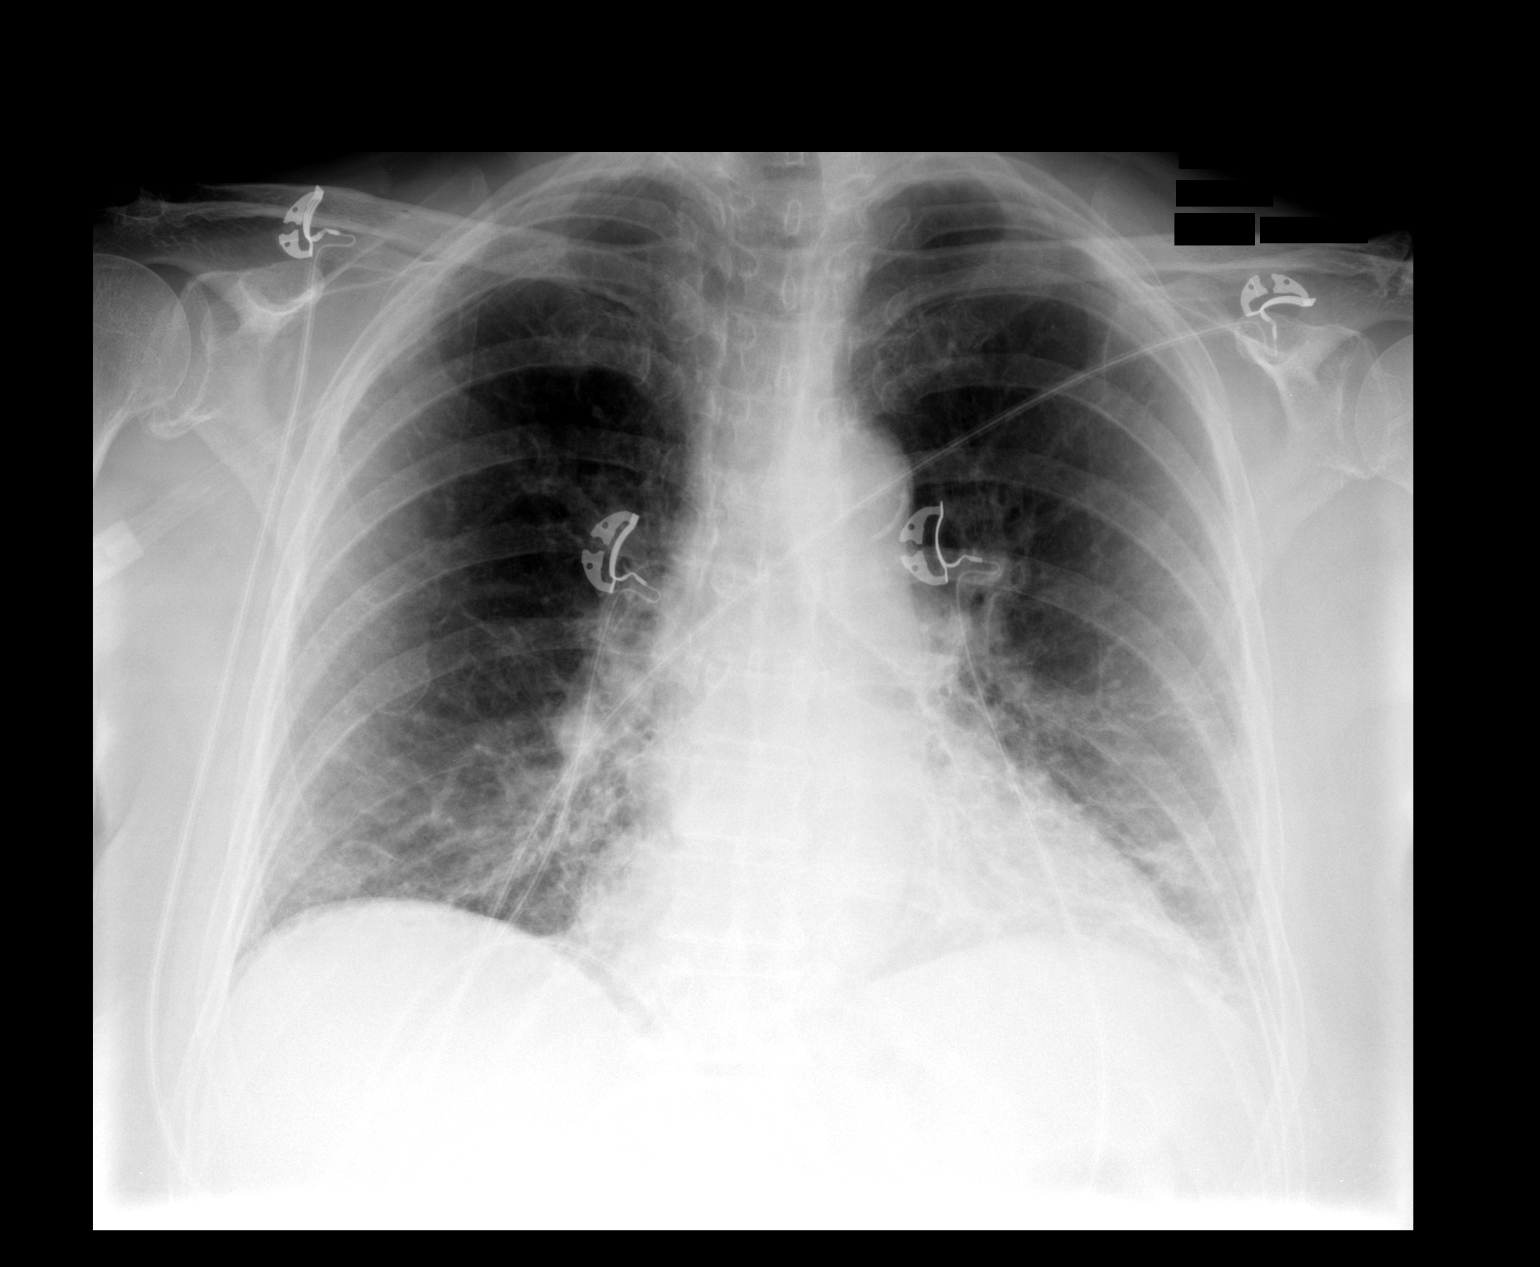

[1 of 1 positions shown; findings below may reference images not displayed]

FINDINGS: Normal mediastinum and cardiac silhouette.  There is
chronic interstitial lung disease at the lung bases.  There is mild
superimposed interstitial edema.  No focal consolidation.  No
pneumothorax.
IMPRESSION: Chronic interstitial lung disease with superimposed mild
interstitial edema.

## 2011-11-25 ENCOUNTER — Ambulatory Visit (INDEPENDENT_AMBULATORY_CARE_PROVIDER_SITE_OTHER): Payer: Medicare Other | Admitting: Family Medicine

## 2011-11-25 VITALS — BP 148/70 | HR 65 | Temp 97.0°F | Resp 16 | Ht 65.0 in | Wt 145.0 lb

## 2011-11-25 DIAGNOSIS — M25559 Pain in unspecified hip: Secondary | ICD-10-CM

## 2011-11-25 DIAGNOSIS — M76899 Other specified enthesopathies of unspecified lower limb, excluding foot: Secondary | ICD-10-CM

## 2011-11-25 DIAGNOSIS — M7060 Trochanteric bursitis, unspecified hip: Secondary | ICD-10-CM

## 2011-11-25 NOTE — Progress Notes (Signed)
  Subjective:    Patient ID: Anne Hayes, female    DOB: January 13, 1924, 78 y.o.   MRN: [id number]  HPI 78 yo female with hip pain for 3 weeks. Left.  Has had this pain before - had shot by Dr. Myra Rude 3 years ago.  Lasted until recently.  Started gradually and worsening.  No fall or injury.  No radiation.  No weakness.  Hurts to walk though.     Review of Systems Negative except as per HPI     Objective:   Physical Exam  Constitutional: She appears well-developed and well-nourished.  Pulmonary/Chest: Effort normal.  Neurological: She is alert.  Skin: Skin is warm.   Pain over left greater trochanter.  No pain with IR or ER.  Full strength.  Full ROM  VCO.  Area prepped with Betadyne x 2, etoh x 2.  Skin numbed with ethyl chloride spray.  3cc lido 1%plain with 1 cc kenalog 40 injected on 25G 1.5 inch needle.  Tolerated procedure well.  Dressed with band aid.      Assessment & Plan:  Hip pain Trochanteric bursitis Injected here. Ice at home.

## 2011-11-26 ENCOUNTER — Ambulatory Visit: Payer: Medicare Other | Admitting: Pulmonary Disease

## 2011-12-03 ENCOUNTER — Ambulatory Visit (INDEPENDENT_AMBULATORY_CARE_PROVIDER_SITE_OTHER): Payer: Medicare Other | Admitting: *Deleted

## 2011-12-03 DIAGNOSIS — Z7901 Long term (current) use of anticoagulants: Secondary | ICD-10-CM

## 2011-12-03 DIAGNOSIS — I4891 Unspecified atrial fibrillation: Secondary | ICD-10-CM

## 2011-12-03 LAB — POCT INR: INR: 1.5

## 2011-12-17 ENCOUNTER — Ambulatory Visit (INDEPENDENT_AMBULATORY_CARE_PROVIDER_SITE_OTHER): Payer: Medicare Other | Admitting: *Deleted

## 2011-12-17 DIAGNOSIS — Z7901 Long term (current) use of anticoagulants: Secondary | ICD-10-CM

## 2011-12-17 DIAGNOSIS — I4891 Unspecified atrial fibrillation: Secondary | ICD-10-CM

## 2011-12-25 ENCOUNTER — Encounter: Payer: Self-pay | Admitting: Pulmonary Disease

## 2011-12-25 ENCOUNTER — Ambulatory Visit (INDEPENDENT_AMBULATORY_CARE_PROVIDER_SITE_OTHER): Payer: Medicare Other | Admitting: Pulmonary Disease

## 2011-12-25 VITALS — BP 116/74 | HR 60 | Temp 97.9°F | Ht 66.5 in | Wt 148.0 lb

## 2011-12-25 DIAGNOSIS — J8410 Pulmonary fibrosis, unspecified: Secondary | ICD-10-CM

## 2011-12-25 DIAGNOSIS — K219 Gastro-esophageal reflux disease without esophagitis: Secondary | ICD-10-CM

## 2011-12-25 DIAGNOSIS — E7800 Pure hypercholesterolemia, unspecified: Secondary | ICD-10-CM

## 2011-12-25 DIAGNOSIS — M1990 Unspecified osteoarthritis, unspecified site: Secondary | ICD-10-CM

## 2011-12-25 DIAGNOSIS — F411 Generalized anxiety disorder: Secondary | ICD-10-CM

## 2011-12-25 DIAGNOSIS — I10 Essential (primary) hypertension: Secondary | ICD-10-CM

## 2011-12-25 DIAGNOSIS — I679 Cerebrovascular disease, unspecified: Secondary | ICD-10-CM

## 2011-12-25 DIAGNOSIS — C649 Malignant neoplasm of unspecified kidney, except renal pelvis: Secondary | ICD-10-CM

## 2011-12-25 DIAGNOSIS — I4891 Unspecified atrial fibrillation: Secondary | ICD-10-CM

## 2011-12-25 DIAGNOSIS — R413 Other amnesia: Secondary | ICD-10-CM

## 2011-12-25 DIAGNOSIS — K5900 Constipation, unspecified: Secondary | ICD-10-CM

## 2011-12-25 NOTE — Progress Notes (Signed)
Subjective:    Patient ID: Anne Hayes, female    DOB: Oct 02, 1923, 78 y.o.   MRN: [id number]  HPI 78 y/o WF here for a follow up visit... she has multiple medical problems as noted below... Now seeing [name] for primary care.  ~  November 15, 2009:  she had f/u right renal lesion by Urology w/ slow growth evident & Bx 4/10 showed Renal Cell Ca- clear cell type... decision was made betw DrBorden & [name] to try radiofreq ablation & this was done on 01/06/09 & 08/18/09... f/u CT 12/10 showed large ablation defect in right kidney- no residual enhancement (?sm nodule RLL to be followed as well)... she remains on Flecanide Rx from [name]- stable... her CC is intermittent hoarseness & "can't clear my throat"... her exam is neg & we discussed Rx w/ Mucinex, MMW, & ENT eval to get a look at her cords...  ~  May 14, 2011:  4mo ROV & referred by [name] for f/u of her chronic throat symptoms, cough, & interstitial lung disease on CXR> currently c/o cough x 41yr she says but really c/o clearing her throat a lot & these symptoms go back at least 72yrs w/ eval by ENT 2/11 DrRosen> laryngoscopy showed some edema & clinically felt to have LER & Rx w/ Omep 40mg /d but she was not regular w/ it & states it didn't help;  She had some mild basilar interstial changes on CXR back then but sl incr currently (1&1/2 yrs later), also has been on Amiodarone since 11/11 for difficult AFib...    She has had a very difficult time w/ her AFib trated by [name] & [name]> she had been tried on Flecainide, Sotalol, etc but either intol or ineffective; finally started on Amiodarone 11/11 & had 2 separate DCCV attempts but it didn't hold;  Finally had EPstudies by [name] w2/ RF ablation & has been holding NSR since then & the Amio was discontinued...    More recent eval from [name] for cough, assoc wheezing, hx bronchitis w/ CXR showing basilar ILD & no improvement after several rounds of antibiotics; interesting  that she had run out of her Prevacid rx for GERD...    >> CXR 7/12 from [name] showed chronic interstitial markings towards the lower lobes & atherosclerosis in Ao...    >> We discussed the need for further eval w/ Collagen-Vasc screen, PFT, CT Chest;  If everything shows as expected she will need a vigorous Antireflux regimen to treat this condition...  ~  July 23, 2011:  46mo ROV & she has mult somatic complaints today> urine has foul odor, no dysuria, rec incr fluids & cranberry juice; c/o concern over her BP (it's 122/66 today), and LBP (chr problem, present for yrs, & has seen [name] & [name]), most recently seen by [facility] w/ adjustments & Tylenol for pain;  Also c/o memory prob & her daughter who accompanies her today wants her on meds, rec to start trial Aricept 5mg /d...    Throat symptoms> on MMW & using it 1-2 per day; she states choking episodes have stopped & throat feels better...    Pulm fibrosis> on Pulmicort & using Tussionex prn; states breathing is stable, at baseline, & she is exercising at the gym 2-3 days per week...    Reflux symptoms> on Prilosec20 before dinner, antireflux regimen, & Zantac Qhs; she denies reflux symptoms and states she is not doing these treatments regularly...  ~  December 25, 2011:  33mo ROV & she has mult  rambling complaints> legs burning & recent neuropathy eval by [name] w/ NCVs that she says indicated "the start of neuropathy"; "I can't sleep" yet she takes Ambien 10mg  nightly & rests fine, tolerates well, but worries about potential side effiects (discussed TylenolPM, Melatonin, other meds and she will discuss w/ [name]); she had Cards f/u w/ [name] & Allred "he wanted to change to Xarelto but too$$$"; also c/o intermittent fecal incont- doesn't have GYN & rec to f/u w/ DrDBrodie...    Pulm Fibrosis> only using MMW as needed, not on inhalers (she never took the Pulmicort), Tussionex, etc; she also stopped most of her prev antireflux Rx;  fortunately she is not symptomatic & denies CP, SOB, cough, sputum, etc; CXR/ CTChest/ PFTs/ etc from 8/12 reviewed; last imaging was CXR & CTAbd by [name] 12/12 w/ stable fibrosis pattern at bases...    HBP/ AFib> on Coumadin, Norvasc5, Cardizem60 prn; followed by [name] & [name]- their notes are reviewed...    Chol> on Prav80 & Zetia10 w/ labs followed by [name] (he is not on EPIC)...    GI> GERD, Hx constip, c/o intermittent fecal incont> we discussed Kaegel exercises 7 she will f/u w/ DrDBrodie...    Anxiety/ Insomnia/ Memory loss> on Aricept5 & Ambien10; she will discuss w/ [name] in f/u...          Problem List:   Hx of COUGH, THROAT CLEARING SYMPTOMS, & BASILAR INTERSTITIAL FIBROSIS >> hx intermittent and recurrent croupy cough & throat symptoms- reflux related> we discussed maximum antireflux regimen w/ PPI Bid, NPO after dinner, elev HOB 6" etc... ~  baseline CXR showed incr markings c/w mild fibrosis... ~  CXR 2/11 showed cardiomeg, & diffuse chr interstitial opac, osteopenia, NAD.[name]. ~  CXR 7/12 from [name] showed chronic interstitial markings towards the lower lobes & atherosclerosis in Ao... ~  Referred back 8/12 by DrStonking w/ recurrent symptoms likely related to worsening subclinical reflux... ~  CT Chest 8/12 showed stable emphysematous changes and basilar fibrotic changes; stable scattered pulm nodularlity w/ no suspicious lesions; aortic atheromatous changes; no signif adenopathy; RF ablation changes to right kidney... ~  PFT 8/12 showed FVC=2.23 (77%), FEV1=1.81 (88%), %1sec=81, mid-flows= 148% pred> c/w mild restriction. ~  Labs 8/12 were WNL and neg collagen-vasc screen (CBC/ BMet= wnl, ANA=neg, RF=<10, Sed=25, ACE=37, IgE<1.5)... ~  10/12: supposed to be on Pulmicort 2spBid, MMW, and max antireflux regimen- but it is unclear how much she is actually doing... ~  12/12: she saw [name] for f/u Renal cell ca> CXR w/ bibasilar fibrosis, CT Abd w/ section  thru lung bases- about the same... ~  3/13: she is off the Pulmicort, Prilosec & antireflux measures; she has mult somatic complaints but not specifically c/o cough, sputum, SOB, etc; we will continue to monitor her progress...  HYPERTENSION (ICD-401.9) - on Amlodipine5mg /d per [name]... ~  10/12:  She did not bring her med list to the OV today... ~  3/13:  She has seen [name] & [name] since her last visit w/ me & EPIC lists Norvasc5 & Cardizem60 "prn" per [name] but she is unaware...  ATRIAL FIBRILLATION (ICD-427.31) - long hx of tachy palpit> [facility]; she is followed by [name] & [name] for LeB Cards. ~  She was tried on Flecainide & Sotalol but INTOL or ineffective;  Started on Amiodarone 11/11 but 2 unsuccessful cardioversions;  Finally had EP study & RF ablation & she has been holding NSR & off Amiodarone... ~  [name] tried to switch  her from Coumadin to Xarelto but it was too $$$ & she switched back  CEREBROVASCULAR DISEASE (ICD-437.9) - she had a neuro eval 2008 by DrWillis w/ hx of diplopia... ~  MRI showed some atrophy & small vessel disease...  HYPERCHOLESTEROLEMIA (ICD-272.0) - on PRAVASTATIN 80mg /d,  ZETIA 10mg /d,  FISH OIL 1000mg Bid... Labs followed by [name]... ~  EPIC EMR labs reviewed, we don't have [name]'s lab results...  GERD (ICD-530.81) - last EGD was 1998 by [facility] showing small gastric ulcer, neg HPylori, Rx PPI.  ~  We reviewed vigorous antireflux regimen for Rx of basilar pulm fibrosis> Prilosec20 before dinner, Zantac at bedtime, elev HOB, don't eat much after dinner.  CONSTIPATION (ICD-564.00) - last colonoscopy 11/07 by DrDBrodie was WNL- no divertics, polyps, etc... constip Rx w/ Miralax/ Senakot OTC... but she claims severe reaction to Miralax(?)...  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 she has urge >  stress incont w/ unacceptable side effects from Detrol, Ditropan, Enablex, Veiscare... she had prev bladder tac from DrMcPhail, & Raz Urethropexy from DrTannenbaum... she has a left renal cyst- 4-52mm size, no changes from 2000 & followed by sonar... incidental finding of an 18mm enhancing lesion in right kidney, mid to lower pole- seen on CT Abd 9/08 (concern for small renal cell ca)... plan was for f/u sonar Q59mo to determine the growth rate... ~  S/p radiofreq ablation of right renal cell ca 4/10 by [name] ~  11/10: pt had percutaneous cryoablation of the right renal cell ca focal reurrence> he continues to follow the pt every 70mo... ~  Most recent f/u scan 12/12 showed stable ablation defect w/o evid of recurrent tumor or regional mets...  DEGENERATIVE JOINT DISEASE (ICD-715.90) - hx DJD and LBP treated by [name] and DrKritzer in the past...  MEMORY LOSS >> she is c/o memory & daughter requests meds to be started so we discussed Aricept 5mg /d for now...  ANXIETY DISORDER, GENERALIZED (ICD-300.02)   Past Medical History  Diagnosis Date  . Cough   . Hypertension   . Atrial fibrillation   . Cerebrovascular disease, unspecified   . Hyperlipidemia   . Esophageal reflux   . Unspecified disorder of kidney and ureter   . Malignant neoplasm of kidney, except pelvis   . Osteoarthrosis, unspecified whether generalized or localized, unspecified site   . Generalized anxiety disorder   . H/O: hysterectomy 1977  . GERD (gastroesophageal reflux disease)   . Hiatal hernia     Past Surgical History  Procedure Date  . Appendectomy   . Cholecystectomy   . Bunionectomies      BILATERAL  . Anterior and posterior vaginal repair 1989    AP REPAIR & RAZ URETHROPEXY  . S/p percutaneous cryoablation of right renal cell cancer 07/2009    by [facility]    Outpatient Encounter Prescriptions as of 12/25/2011  Medication Sig Dispense Refill  . Alum & Mag Hydroxide-Simeth (MAGIC MOUTHWASH) SOLN  Take 5 mLs by mouth 2 (two) times daily.        [name] amLODipine (NORVASC) 5 MG tablet Take 5 mg by mouth daily.        . cephALEXin (KEFLEX) 500 MG capsule Take 500 mg by mouth every 12 (twelve) hours. Filled on 11/26. Take for 8 days.       . Cholecalciferol (VITAMIN D3) 1000 UNITS tablet Take 1,000 Units by mouth daily.        [name]  diltiazem (CARDIZEM) 60 MG tablet Take 1 tablet (60 mg total) by mouth once as needed (Take 1 tab at onset and repeat in 2 hours if still in atrial-fib.).  10 tablet  3  . donepezil (ARICEPT) 5 MG tablet Take 5 mg by mouth daily.        [name] ezetimibe (ZETIA) 10 MG tablet Take 10 mg by mouth daily.        . folic acid (FOLVITE) 400 MCG tablet Take 400 mcg by mouth daily.        . pravastatin (PRAVACHOL) 80 MG tablet Take 80 mg by mouth daily.        . promethazine (PHENERGAN) 25 MG tablet Take 12.5 mg by mouth every 6 (six) hours as needed. As needed for nausea.      . sodium chloride (OCEAN) 0.65 % nasal spray Place 2 sprays into the nose as needed.        . warfarin (COUMADIN) 5 MG tablet        . warfarin (COUMADIN) 5 MG tablet TAKE AS DIRECTED  45 tablet  1  . zolpidem (AMBIEN) 10 MG tablet Take 10 mg by mouth at bedtime as needed.          Allergies  Allergen Reactions  . Codeine     REACTION: nausea/vomiting  . Eggs Or Egg-Derived Products Nausea Only  . Penicillins     REACTION: nausea/vomiting  . Sulfa Antibiotics Itching    Current Medications, Allergies, Past Medical History, Past Surgical History, Family History, and Social History were reviewed in [employer] record.    Review of Systems       See HPI - all other systems neg except as noted...       The patient complains of decreased hearing and dyspnea on exertion.  The patient denies anorexia, fever, weight loss, weight gain, vision loss, hoarseness, chest pain, syncope, peripheral edema, headaches, hemoptysis, abdominal pain, melena, hematochezia, hematuria, incontinence,  muscle weakness, suspicious skin lesions, transient blindness, difficulty walking, depression, unusual weight change, abnormal bleeding, enlarged lymph nodes, and angioedema.     Objective:   Physical Exam     WD, WN, 78 y/o WF in NAD... GENERAL:  Alert & oriented; pleasant & cooperative... HEENT:  [place]/AT, EOM-wnl, PERRLA, EACs-clear, TMs-wnl, NOSE-clear, THROAT-clear & wnl, no lesions seen. NECK:  Supple w/ fairROM; no JVD; normal carotid impulses w/o bruits; no thyromegaly or nodules palpated; no lymphadenopathy. CHEST:  Clear to P & A x for dry velcro rales at bases bilat... HEART:  Regular Rhythm; without murmurs/ rubs/ or gallops heard... ABDOMEN:  Soft & nontender; normal bowel sounds; no organomegaly or masses detected. EXT: without deformities, mod arthritic changes; no varicose veins/ +venous insuffic/ no edema. NEURO:  CN's intact; motor testing normal; sensory testing normal; gait normal & balance OK. DERM:  No lesions noted; no rash etc...  CXR >> 4/12 film showed left basilar scarring & atx, otherw neg...               12/12 film w/ bibasilar scarring & chr changes...  CT Chest 8/12 >> stable emphysematous changes and basilar fibrotic changes; stable scattered pulm nodularlity w/ no suspicious lesions; aortic atheromatous changes; no signif adenopathy; RF ablation changes to right kidney...               CT Abd 12/12 showed similar chr fibrotic changes at lung bases...  Office Spirometry 8/12 >> FVC= 2.23 (77%), FEV1= 1.81 (88%), %1sec=81, mid-flows= 148%pred >>  no signif obstruction, poss mild restriction...  Labs >> WNL and neg collagen-vasc screen (CBC/ BMet= wnl, ANA=neg, RF=<10, Sed=25, ACE=37, IgE<1.5)...   Assessment & Plan:   Chronic Cough, Throat symptoms, etc>  Review of old records here shows long hx throat symptoms, intermittent hoarseness, "can't clear my throat", and cough;  She was prev treated w/ Mucinex, MMW, Antireflux regimen, and ENT eval for her throat &  it was all c/w Laryngoesophageal reflux but she never followed up & never stuck w/ a vigorous antireflux regimen...  REC:  PRILOSEC 20mg  Bid taken 30 min before breakfast & dinner;  Don't eat or drink much after dinner to allow stomach to empty completely before bedtime;  Elevate HOB on 6" blocks ==> she reports throat symptoms improved on Rx. ~3/13: she has stopped her antireflux regimen but is now essentially asymptomatic w/o cough, throat symptoms, etc...  Basilar Interstitial Lung Dis>  This could be mild idiopathic pulm fibrosis, but could also be related to reflux & chr nocturnal asp, or poss to Amiodarone Rx;  The Amio has been discontinued;  She is to start a vigorous antireflux regimen & she needs to be vigilant about it;  And her CXR should be followed Q6-12 month to look for signs of deterioration, worsening fibrosis etc...  With her recent airway symptoms> we will start PULMICORT 2 inhalations Bid ==> stable on rx. ~3/13: she never took the Pulmicort; states cough & other symptoms resolved or markedly diminished...   HBP>  On meds per [name] & well controlled...  AFIB>  On Rx per [name] & [name] & currently improved after RF Ablation & holding NSR (and off Amio); she refused Xarelto due to cost & is back on Coumadin.  CHOL>  On Prav40 n+ Zetia & labs followed by [name]...  GERD>  She has prev seen DrMedoff & may need f/u GI eval as above...  Renal Cell Ca>  S/p percut cryoablation by [name]...  MEMORY LOSS>  They would like medication to be started for this problem & given Aricept 5mg /d...  Other medical problems as noted.Marland KitchenMarland Kitchen

## 2011-12-25 NOTE — Patient Instructions (Signed)
Today we updated your med list in our EPIC system...    Continue your current medications the same...  We reviewed your recent CXR & CT scan from [facility]...  Let me know if the cough, Shortness of breath, etc returns...    and watch for heartburn reflux or night time symptoms & let me know...  Let's plan a follow up visit in 6 months to monitor your progress.Marland KitchenMarland Kitchen

## 2011-12-31 ENCOUNTER — Ambulatory Visit (INDEPENDENT_AMBULATORY_CARE_PROVIDER_SITE_OTHER): Payer: Medicare Other

## 2011-12-31 DIAGNOSIS — Z7901 Long term (current) use of anticoagulants: Secondary | ICD-10-CM

## 2011-12-31 DIAGNOSIS — I4891 Unspecified atrial fibrillation: Secondary | ICD-10-CM

## 2012-01-08 ENCOUNTER — Encounter: Payer: Self-pay | Admitting: Cardiology

## 2012-01-08 ENCOUNTER — Ambulatory Visit (INDEPENDENT_AMBULATORY_CARE_PROVIDER_SITE_OTHER): Payer: Medicare Other | Admitting: Cardiology

## 2012-01-08 ENCOUNTER — Ambulatory Visit (INDEPENDENT_AMBULATORY_CARE_PROVIDER_SITE_OTHER): Payer: Medicare Other | Admitting: *Deleted

## 2012-01-08 VITALS — BP 110/78 | HR 122 | Ht 66.5 in | Wt 145.0 lb

## 2012-01-08 DIAGNOSIS — I495 Sick sinus syndrome: Secondary | ICD-10-CM

## 2012-01-08 DIAGNOSIS — Z7901 Long term (current) use of anticoagulants: Secondary | ICD-10-CM

## 2012-01-08 DIAGNOSIS — I4891 Unspecified atrial fibrillation: Secondary | ICD-10-CM

## 2012-01-08 MED ORDER — ENOXAPARIN SODIUM 100 MG/ML ~~LOC~~ SOLN: 100.0000 mg | Freq: Every day | SUBCUTANEOUS | Status: DC

## 2012-01-08 MED ORDER — DILTIAZEM HCL ER 240 MG PO CP24: 240.0000 mg | ORAL_CAPSULE | Freq: Every day | ORAL | Status: DC

## 2012-01-08 NOTE — Patient Instructions (Addendum)
Your physician has recommended you make the following change in your medication: Diltiazem ( Cardizem) ER 240mg  1 capsule daily  Your physician recommends that you schedule a follow-up appointment with Dr. Johney Frame in a few weeks.

## 2012-01-08 NOTE — Progress Notes (Signed)
HPI [name] comes in today because of recurrent A. fib. She probably went into it over the weekend which was several days ago. He said she did not sleep awakening night before last because she felt like she might die. She laughs but she really did feel very weak and drained. She denies any palpitations but does feel a little bit short of breath. She denies orthopnea PND or edema. She's had no presyncope or syncope.  She did take the short-acting diltiazem 60 mg which slowed her heart rate down into the low 100s. She said her fast rate she details 130-140.  Her EKG today shows a heart rate of 120-125.  Looking back through her chart, she only had one episode of atrial fib since her ablation. She has not seen Dr. Johney Frame in several months.  Her INR has also been subtherapeutic over the last several weeks.   Past Medical History  Diagnosis Date  . Cough   . Hypertension   . Atrial fibrillation   . Cerebrovascular disease, unspecified   . Hyperlipidemia   . Esophageal reflux   . Unspecified disorder of kidney and ureter   . Malignant neoplasm of kidney, except pelvis   . Osteoarthrosis, unspecified whether generalized or localized, unspecified site   . Generalized anxiety disorder   . H/O: hysterectomy 1977  . GERD (gastroesophageal reflux disease)   . Hiatal hernia   . PN (peripheral neuropathy)     in feet    Current Outpatient Prescriptions  Medication Sig Dispense Refill  . Alum & Mag Hydroxide-Simeth (MAGIC MOUTHWASH) SOLN Take 5 mLs by mouth 2 (two) times daily.        . Calcium Citrate-Vitamin D (CITRACAL + D PO) Take by mouth daily.      . Cholecalciferol (VITAMIN D3) 1000 UNITS tablet Take 1,000 Units by mouth daily.        . Coenzyme Q10 200 MG capsule Take 200 mg by mouth daily.      [name] diltiazem (CARDIZEM) 60 MG tablet Take 1 tablet (60 mg total) by mouth once as needed (Take 1 tab at onset and repeat in 2 hours if still in atrial-fib.).  10 tablet  3  . donepezil  (ARICEPT) 5 MG tablet Take 5 mg by mouth daily.        [name] ezetimibe (ZETIA) 10 MG tablet Take 10 mg by mouth daily.        . folic acid (FOLVITE) 400 MCG tablet Take 400 mcg by mouth daily.        . Omega-3 Fatty Acids (FISH OIL) 1200 MG CAPS Take by mouth daily.      . pravastatin (PRAVACHOL) 80 MG tablet Take 80 mg by mouth daily.        . promethazine (PHENERGAN) 25 MG tablet Take 12.5 mg by mouth every 6 (six) hours as needed. As needed for nausea.      . sodium chloride (OCEAN) 0.65 % nasal spray Place 2 sprays into the nose as needed.        . warfarin (COUMADIN) 5 MG tablet TAKE AS DIRECTED  45 tablet  1  . zolpidem (AMBIEN) 10 MG tablet Take 10 mg by mouth at bedtime as needed.        . diltiazem (DILACOR XR) 240 MG 24 hr capsule Take 1 capsule (240 mg total) by mouth daily.  30 capsule  11    Allergies  Allergen Reactions  . Codeine     REACTION: nausea/vomiting  .  Eggs Or Egg-Derived Products Nausea Only  . Penicillins     REACTION: nausea/vomiting  . Sulfa Antibiotics Itching    Family History  Problem Relation Age of Onset  . Heart disease Father     History   Social History  . Marital Status: Widowed    Spouse Name: N/A    Number of Children: N/A  . Years of Education: N/A   Occupational History  . RETIRED    Social History Main Topics  . Smoking status: Former Smoker -- 1.0 packs/day for 50 years    Types: Cigarettes    Quit date: 09/30/1988  . Smokeless tobacco: Not on file  . Alcohol Use: No     glass of wine with dinner  . Drug Use: No  . Sexually Active: No   Other Topics Concern  . Not on file   Social History Narrative   WIDOWEDFORMER SMOKERETOH YESNO DRUG USERETIRED    ROS ALL NEGATIVE EXCEPT THOSE NOTED IN HPI  PE  General Appearance: well developed, well nourished in no acute distress HEENT: symmetrical face, PERRLA, good dentition  Neck: no JVD, thyromegaly, or adenopathy, trachea midline Chest: symmetric without deformity Cardiac:  PMI non-displaced, irregular rate and rhythm is fast, normal S1, S2, no gallop or murmur Lung: clear to ausculation and percussion Vascular: all pulses full without bruits  Abdominal: nondistended, nontender, good bowel sounds, no HSM, no bruits Extremities: no cyanosis, clubbing or edema, no sign of DVT, no varicosities  Skin: normal color, no rashes Neuro: alert and oriented x 3, non-focal Pysch: normal affect  EKG  peroxisomal A. fib at a rate of 120 per minute.  BMET    Component Value Date/Time   NA 142 09/01/2011 0923   K 4.2 09/01/2011 0923   CL 106 09/01/2011 0923   CO2 27 08/20/2011 1517   GLUCOSE 104* 09/01/2011 0923   BUN 18 09/01/2011 0923   CREATININE 1.00 09/01/2011 0923   CALCIUM 8.9 08/20/2011 1517   GFRNONAA 53* 01/08/2011 2129   GFRAA  Value: >60        The eGFR has been calculated using the MDRD equation. This calculation has not been validated in all clinical situations. eGFR's persistently <60 mL/min signify possible Chronic Kidney Disease. 01/08/2011 2129    Lipid Panel     Component Value Date/Time   CHOL 155 02/27/2010 0709   TRIG 206.0* 02/27/2010 0709   HDL 40.90 02/27/2010 0709   CHOLHDL 4 02/27/2010 0709   VLDL 41.2* 02/27/2010 0709   LDLCALC 82 09/08/2008 0924    CBC    Component Value Date/Time   WBC 5.9 05/14/2011 1714   RBC 4.35 05/14/2011 1714   HGB 14.6 09/01/2011 0923   HCT 43.0 09/01/2011 0923   PLT 176.0 05/14/2011 1714   MCV 92.9 05/14/2011 1714   MCH 30.0 01/08/2011 2129   MCHC 33.5 05/14/2011 1714   RDW 14.0 05/14/2011 1714   LYMPHSABS 1.2 05/14/2011 1714   MONOABS 0.6 05/14/2011 1714   EOSABS 0.2 05/14/2011 1714   BASOSABS 0.0 05/14/2011 1714

## 2012-01-08 NOTE — Progress Notes (Signed)
Per Dr. Daleen Squibb, start pt on Lovenox through Saturday 01/11/12.  Pt's weight is 65.9kg.  Lovenox dose will be 100mg  daily subcutaneous.  Pt educated on self administering Lovenox.  Pt was observed to self administer a 100mg  lovenox injection while in the office.  The lot and EXP were as follows:  Lot [id number], EXP 08/2013.  Rx for lovenox called into pt's pharmacy.  Pt verbalized understanding of lovenox and how to administer.

## 2012-01-08 NOTE — Assessment & Plan Note (Signed)
She's probably been in atrial fibrillation by history the last several days. Her INR has been subtherapeutic for the last month. We rechecked it today it was 1.6. I will give her 10 mg a day for the next 2 days and then up her dose to 7.5 mg every day with a check again next Monday. We will give her Lovenox for 3 days for overlap. I've also started her on diltiazem extended release 240 mg a day. She will continue to use 60 mg of short acting diltiazem when she's really tachycardic. I will schedule her to come back and see Dr. Johney Frame in the next few weeks to consider any other recommendations.

## 2012-01-13 ENCOUNTER — Ambulatory Visit (INDEPENDENT_AMBULATORY_CARE_PROVIDER_SITE_OTHER): Payer: Medicare Other | Admitting: Pharmacist

## 2012-01-13 ENCOUNTER — Other Ambulatory Visit: Payer: Self-pay | Admitting: Pulmonary Disease

## 2012-01-13 DIAGNOSIS — I4891 Unspecified atrial fibrillation: Secondary | ICD-10-CM

## 2012-01-13 DIAGNOSIS — Z7901 Long term (current) use of anticoagulants: Secondary | ICD-10-CM

## 2012-01-13 LAB — POCT INR: INR: 2.4

## 2012-01-20 ENCOUNTER — Encounter: Payer: Self-pay | Admitting: Nurse Practitioner

## 2012-01-20 ENCOUNTER — Ambulatory Visit (INDEPENDENT_AMBULATORY_CARE_PROVIDER_SITE_OTHER): Payer: Medicare Other | Admitting: *Deleted

## 2012-01-20 ENCOUNTER — Ambulatory Visit (INDEPENDENT_AMBULATORY_CARE_PROVIDER_SITE_OTHER): Payer: Medicare Other | Admitting: Nurse Practitioner

## 2012-01-20 VITALS — BP 126/76 | HR 81 | Ht 66.5 in

## 2012-01-20 DIAGNOSIS — I4891 Unspecified atrial fibrillation: Secondary | ICD-10-CM

## 2012-01-20 DIAGNOSIS — Z7901 Long term (current) use of anticoagulants: Secondary | ICD-10-CM

## 2012-01-20 LAB — POCT INR: INR: 2.3

## 2012-01-20 MED ORDER — DILTIAZEM HCL ER BEADS 300 MG PO CP24: 300.0000 mg | ORAL_CAPSULE | Freq: Every day | ORAL | Status: DC

## 2012-01-20 NOTE — Progress Notes (Signed)
Patient Name: Anne Hayes Date of Encounter: 01/20/2012  Primary Care Provider:  Ginette Otto, MD, MD Primary Cardiologist:  T. Wall, MD  Patient Profile  78 year old female with history of proximal atrial fibrillation who presents with recurrent A. Fib.  Problem List   Past Medical History  Diagnosis Date  . Cough   . Hypertension   . Atrial fibrillation   . Cerebrovascular disease, unspecified   . Hyperlipidemia   . Esophageal reflux   . Unspecified disorder of kidney and ureter   . Malignant neoplasm of kidney, except pelvis   . Osteoarthrosis, unspecified whether generalized or localized, unspecified site   . Generalized anxiety disorder   . H/O: hysterectomy 1977  . GERD (gastroesophageal reflux disease)   . Hiatal hernia   . PN (peripheral neuropathy)     in feet   Past Surgical History  Procedure Date  . Appendectomy   . Cholecystectomy   . Bunionectomies      BILATERAL  . Anterior and posterior vaginal repair 1989    AP REPAIR & RAZ URETHROPEXY  . S/p percutaneous cryoablation of right renal cell cancer 07/2009    by [name]    Allergies  Allergies  Allergen Reactions  . Codeine     REACTION: nausea/vomiting  . Eggs Or Egg-Derived Products Nausea Only  . Penicillins     REACTION: nausea/vomiting  . Sulfa Antibiotics Itching    HPI  44 -year-old female with the above problem list.  She was last seen in clinic on April 10 at which time she was noted to be in atrial fibrillation.  She is quite fatigued and she is in A. Fib but denies any dyspnea or chest pain.  On that day, her diltiazem was titrated to 240 mg daily.  She has not noted any great reduction in her frequency of A. Fib since that.  In fact, since this past Saturday she believes she's been in atrial fibrillation and as a result has been quite fatigued.  She is seen in Coumadin clinic today with complaints of fatigue and was then added onto my schedule.  She denies any chest  pain or dyspnea.  She is in A. Fib with a rate of 81.  Home Medications  Prior to Admission medications   Medication Sig Start Date End Date Taking? Authorizing Provider  Alum & Mag Hydroxide-Simeth (MAGIC MOUTHWASH) SOLN Take 5 mLs by mouth 2 (two) times daily.     Yes Historical Provider, MD  Calcium Citrate-Vitamin D (CITRACAL + D PO) Take by mouth daily.   Yes Historical Provider, MD  Cholecalciferol (VITAMIN D3) 1000 UNITS tablet Take 1,000 Units by mouth daily.     Yes Historical Provider, MD  Coenzyme Q10 200 MG capsule Take 200 mg by mouth daily.   Yes Historical Provider, MD  diltiazem (CARDIZEM) 60 MG tablet Take 1 tablet (60 mg total) by mouth once as needed (Take 1 tab at onset and repeat in 2 hours if still in atrial-fib.). 09/05/11 09/04/12 Yes Gaylord Shih, MD  donepezil (ARICEPT) 5 MG tablet Take 5 mg by mouth daily.   07/23/11 07/22/12 Yes Michele Mcalpine, MD  ezetimibe (ZETIA) 10 MG tablet Take 10 mg by mouth daily.     Yes Historical Provider, MD  folic acid (FOLVITE) 400 MCG tablet Take 400 mcg by mouth daily.     Yes Historical Provider, MD  Omega-3 Fatty Acids (FISH OIL) 1200 MG CAPS Take by mouth daily.   Yes  Historical Provider, MD  pravastatin (PRAVACHOL) 80 MG tablet Take 80 mg by mouth daily.     Yes Historical Provider, MD  promethazine (PHENERGAN) 25 MG tablet Take 12.5 mg by mouth every 6 (six) hours as needed. As needed for nausea.   Yes Historical Provider, MD  sodium chloride (OCEAN) 0.65 % nasal spray Place 2 sprays into the nose as needed.     Yes Historical Provider, MD  warfarin (COUMADIN) 5 MG tablet TAKE AS DIRECTED 01/13/12  Yes Michele Mcalpine, MD  zolpidem (AMBIEN) 10 MG tablet Take 10 mg by mouth at bedtime as needed.     Yes Historical Provider, MD  diltiazem (TIAZAC) 300 MG 24 hr capsule Take 1 capsule (300 mg total) by mouth daily. 01/20/12 01/19/13  Ok Anis, NP  enoxaparin (LOVENOX) 100 MG/ML injection Inject 1 mL (100 mg total) into the skin  daily. 01/09/12 01/11/12  Gaylord Shih, MD   Review of Systems Profoundly fatigued in the setting of palpitations for the past 3 days.  No chest pain, sob, n, v, dizziness, syncope, edema, early satiety, dysuria, dark stools, blood in stools, diarrhea, rash/skin changes, fevers, chills, wt loss/gain.  Otherwise all systems reviewed and negative.  Physical Exam  Blood pressure 126/76, pulse 81, height 5' 6.5" (1.689 m), SpO2 97.00%.  General: Pleasant, NAD Psych: Normal affect. Neuro: Alert and oriented X 3. Moves all extremities spontaneously. HEENT: Normal  Neck: Supple without bruits or JVD. Lungs:  Resp regular and unlabored, CTA. Heart: irreg, irreg, no s3, s4, or murmurs. Abdomen: Soft, non-tender, non-distended, BS + x 4.  Extremities: No clubbing, cyanosis or edema. DP/PT/Radials 2+ and equal bilaterally.  Accessory Clinical Findings  ECG - afib, 81, lad, no acute changes.  Assessment & Plan  1.  PAF:  Pt with frequent paroxysms of afib over the past few months.  She hasn't really done any better since seeing Dr. Daleen Squibb on 4/10 and as best she can tell, she has been in afib since this past Saturday (3 days ag0).  She is currently rate controlled however she has noted rates into the 1-teens.  She has been seen by Dr. Daleen Squibb also, and we will titrate her Dilt CD to 300mg  Daily.  She needs EP f/u and she prefers to avoid admission.  We will arrange for f/u with Dr. Johney Frame this week for consideration of touch up ablation.  She remains on coumadin and is therapeutic today.    Nicolasa Ducking, NP 01/20/2012, 5:31 PM

## 2012-01-20 NOTE — Patient Instructions (Addendum)
Your physician recommends that you schedule a follow-up appointment with Dr. Johney Frame;  Wed, January 02, 2012, this appointment will be confirmed.  Add-on Per Dr. Daleen Squibb.   Your physician has recommended you make the following change in your medication: Increase your Diltiazem CD to 300 mg every day; this new prescription has been sent into your pharmacy.

## 2012-01-22 ENCOUNTER — Encounter: Payer: Self-pay | Admitting: Internal Medicine

## 2012-01-22 ENCOUNTER — Ambulatory Visit (INDEPENDENT_AMBULATORY_CARE_PROVIDER_SITE_OTHER): Payer: Medicare Other | Admitting: Internal Medicine

## 2012-01-22 VITALS — BP 124/77 | HR 54 | Resp 18 | Ht 65.0 in | Wt 148.0 lb

## 2012-01-22 DIAGNOSIS — I10 Essential (primary) hypertension: Secondary | ICD-10-CM

## 2012-01-22 DIAGNOSIS — I4891 Unspecified atrial fibrillation: Secondary | ICD-10-CM

## 2012-01-22 DIAGNOSIS — I495 Sick sinus syndrome: Secondary | ICD-10-CM

## 2012-01-22 MED ORDER — AMIODARONE HCL 200 MG PO TABS: ORAL_TABLET | ORAL | Status: DC

## 2012-01-22 NOTE — Assessment & Plan Note (Signed)
Her afib has recently worsened Therapeutic strategies for afib including medicine and ablation were discussed in detail with the patient today.  At this point, she and I agree to resume amiodarone and follow.  If her afib continues despite amiodarone, then we may have to consider repeat catheter ablation. She would like to consider switching from coumadin to either pradaxa or xarelto.  She will ask her pharmacist to check on costs of these before making the switch.  She will contact our office if she decides to start either of these medicines.  Start amiodarone 200mg  BID x 1 week then 200mg  daily Follow-up with me in 4 weeks.

## 2012-01-22 NOTE — Patient Instructions (Addendum)
Your physician recommends that you schedule a follow-up appointment in: 4 weeks  Your physician has recommended you make the following change in your medication:  1) Start Amiodarone 200mg  twice a day for one week then decrease to once daily  Check with pharmacist about Xarelto 15mg  daily or Pradaxa 150mg  twice daily

## 2012-01-22 NOTE — Progress Notes (Signed)
Primary Cardiologist:  Dr Daleen Squibb PCP: Ginette Otto, MD, MD = The patient presents today for routine electrophysiology followup.  She has had increasing episodes of afib recently. She spent the last few days in afib with RVR but has since converted to sinus rhythm. She feels "better today".  Today, she denies symptoms of chest pain, shortness of breath, lower extremity edema, dizziness, presyncope, syncope, or neurologic sequela.  The patient feels that she is tolerating medications without difficulties and is otherwise without complaint today.    Past Medical History  Diagnosis Date  . Cough   . Hypertension   . Atrial fibrillation   . Cerebrovascular disease, unspecified   . Hyperlipidemia   . Esophageal reflux   . Unspecified disorder of kidney and ureter   . Malignant neoplasm of kidney, except pelvis   . Osteoarthrosis, unspecified whether generalized or localized, unspecified site   . Generalized anxiety disorder   . H/O: hysterectomy 1977  . GERD (gastroesophageal reflux disease)   . Hiatal hernia   . PN (peripheral neuropathy)     in feet   Past Surgical History  Procedure Date  . Appendectomy   . Cholecystectomy   . Bunionectomies      BILATERAL  . Anterior and posterior vaginal repair 1989    AP REPAIR & RAZ URETHROPEXY  . S/p percutaneous cryoablation of right renal cell cancer 07/2009    by [facility]    Current Outpatient Prescriptions  Medication Sig Dispense Refill  . Alum & Mag Hydroxide-Simeth (MAGIC MOUTHWASH) SOLN Take 5 mLs by mouth 2 (two) times daily.        [name] amLODipine (NORVASC) 5 MG tablet       . Calcium Citrate-Vitamin D (CITRACAL + D PO) Take by mouth daily.      . Cholecalciferol (VITAMIN D3) 1000 UNITS tablet Take 1,000 Units by mouth daily.        [name] diltiazem (CARDIZEM) 60 MG tablet Take 1 tablet (60 mg total) by mouth once as needed (Take 1 tab at onset and repeat in 2 hours if still in atrial-fib.).  10 tablet  3  . diltiazem (TIAZAC)  300 MG 24 hr capsule Take 1 capsule (300 mg total) by mouth daily.  90 capsule  1  . donepezil (ARICEPT) 5 MG tablet Take 5 mg by mouth daily.        [name] ezetimibe (ZETIA) 10 MG tablet Take 10 mg by mouth daily.        . folic acid (FOLVITE) 400 MCG tablet Take 400 mcg by mouth daily.        . Omega-3 Fatty Acids (FISH OIL) 1200 MG CAPS Take by mouth daily.      . pravastatin (PRAVACHOL) 80 MG tablet Take 80 mg by mouth daily.        . promethazine (PHENERGAN) 25 MG tablet Take 12.5 mg by mouth every 6 (six) hours as needed. As needed for nausea.      . sodium chloride (OCEAN) 0.65 % nasal spray Place 2 sprays into the nose as needed.        . warfarin (COUMADIN) 5 MG tablet TAKE AS DIRECTED  45 tablet  1  . zolpidem (AMBIEN) 10 MG tablet Take 10 mg by mouth at bedtime as needed.        [name] amiodarone (PACERONE) 200 MG tablet Take twice daily for one week then decrease to one daily  35 tablet  11  . Coenzyme Q10 200 MG capsule Take  200 mg by mouth daily.      [name] enoxaparin (LOVENOX) 100 MG/ML injection Inject 1 mL (100 mg total) into the skin daily.  3 Syringe  0    Allergies  Allergen Reactions  . Codeine     REACTION: nausea/vomiting  . Eggs Or Egg-Derived Products Nausea Only  . Penicillins     REACTION: nausea/vomiting  . Sulfa Antibiotics Itching    History   Social History  . Marital Status: Widowed    Spouse Name: N/A    Number of Children: N/A  . Years of Education: N/A   Occupational History  . RETIRED    Social History Main Topics  . Smoking status: Former Smoker -- 1.0 packs/day for 50 years    Types: Cigarettes    Quit date: 09/30/1988  . Smokeless tobacco: Not on file  . Alcohol Use: No     glass of wine with dinner  . Drug Use: No  . Sexually Active: No   Other Topics Concern  . Not on file   Social History Narrative   Joes[facility] [facility] YESNO DRUG USERETIRED    Family History  Problem Relation Age of Onset  . Heart disease Father    Physical  Exam: Filed Vitals:   01/22/12 1640  BP: 124/77  Pulse: 54  Resp: 18  Height: 5\' 5"  (1.651 m)  Weight: 148 lb (67.132 kg)    GEN- The patient is well appearing, alert and oriented x 3 today.   Head- normocephalic, atraumatic Eyes-  Sclera clear, conjunctiva pink Ears- hearing intact Oropharynx- clear Neck- supple, no JVP Lymph- no cervical lymphadenopathy Lungs- Clear to ausculation bilaterally, normal work of breathing Heart- Regular rate and rhythm, no murmurs, rubs or gallops, PMI not laterally displaced GI- soft, NT, ND, + BS Extremities- no clubbing, cyanosis, or edema  EKG4/23- afib V rate 81 bpm ekg today reveals sinus bradycardia 55 bpm, PR 174, poor R wave progression otherwise normal ekg  Assessment and Plan:

## 2012-01-22 NOTE — Assessment & Plan Note (Signed)
Stable No change required today  

## 2012-01-23 ENCOUNTER — Telehealth: Payer: Self-pay | Admitting: Internal Medicine

## 2012-01-23 DIAGNOSIS — I4891 Unspecified atrial fibrillation: Secondary | ICD-10-CM

## 2012-01-23 NOTE — Telephone Encounter (Signed)
Spoke with patient and she is going to decrease her Cardizem to 240mg  as she feels 300mg  will be too much with the addition of Amiodarone  She has the 60mg  of Cardizem to fall back on if needed

## 2012-01-23 NOTE — Telephone Encounter (Signed)
Pt got a med, diltiazem from [facility], allred gave her amiodarone wed, not sure if to take both or one of the other?

## 2012-01-24 NOTE — Progress Notes (Signed)
Addended by: Reine Just on: 01/24/2012 09:34 AM   Modules accepted: Orders

## 2012-02-03 ENCOUNTER — Ambulatory Visit (INDEPENDENT_AMBULATORY_CARE_PROVIDER_SITE_OTHER): Payer: Medicare Other | Admitting: Pharmacist

## 2012-02-03 DIAGNOSIS — I4891 Unspecified atrial fibrillation: Secondary | ICD-10-CM

## 2012-02-03 DIAGNOSIS — Z7901 Long term (current) use of anticoagulants: Secondary | ICD-10-CM

## 2012-02-08 ENCOUNTER — Ambulatory Visit: Payer: PRIVATE HEALTH INSURANCE

## 2012-02-08 ENCOUNTER — Ambulatory Visit (INDEPENDENT_AMBULATORY_CARE_PROVIDER_SITE_OTHER): Payer: Medicare Other | Admitting: Family Medicine

## 2012-02-08 VITALS — BP 160/78 | HR 61 | Temp 97.9°F | Resp 16

## 2012-02-08 DIAGNOSIS — Z23 Encounter for immunization: Secondary | ICD-10-CM

## 2012-02-08 DIAGNOSIS — S61409A Unspecified open wound of unspecified hand, initial encounter: Secondary | ICD-10-CM

## 2012-02-08 DIAGNOSIS — H5711 Ocular pain, right eye: Secondary | ICD-10-CM

## 2012-02-08 DIAGNOSIS — S0510XA Contusion of eyeball and orbital tissues, unspecified eye, initial encounter: Secondary | ICD-10-CM

## 2012-02-08 DIAGNOSIS — M79609 Pain in unspecified limb: Secondary | ICD-10-CM

## 2012-02-08 DIAGNOSIS — M79643 Pain in unspecified hand: Secondary | ICD-10-CM

## 2012-02-08 MED ORDER — OFLOXACIN 0.3 % OP SOLN: OPHTHALMIC | Status: AC

## 2012-02-08 MED ORDER — TRAMADOL HCL 50 MG PO TABS: 50.0000 mg | ORAL_TABLET | Freq: Three times a day (TID) | ORAL | Status: AC | PRN

## 2012-02-08 NOTE — Progress Notes (Signed)
Procedure:  VCO  Lidocaine 2 % plain locally injected.  Sterile Field and prep.  Wound explored.  No deep structures damaged. Closed with 5.0 ethilon, #5 HM and #1 SI. Cleansed. Dressed. Pt tolerated this well.

## 2012-02-08 NOTE — Patient Instructions (Signed)
WOUND CARE Please return in 10 days to have your stitches/staples removed or sooner if you have concerns. [name] Keep area clean and dry for 24 hours. Do not remove bandage, if applied. . After 24 hours, remove bandage and wash wound gently with mild soap and warm water. Reapply a new bandage after cleaning wound, if directed. . Continue daily cleansing with soap and water until stitches/staples are removed. . Do not apply any ointments or creams to the wound while stitches/staples are in place, as this may cause delayed healing. . Notify the office if you experience any of the following signs of infection: Swelling, redness, pus drainage, streaking, fever >101.0 F . Notify the office if you experience excessive bleeding that does not stop after 15-20 minutes of constant, firm Pressure.  Use eye drops for 3-4 days  Put ice to right orbit 3-4 times daily.  I expect you to get a black eye.

## 2012-02-08 NOTE — Progress Notes (Signed)
Subjective: Patient was walking her dog when she tripped on the sidewalk landing on her right hand. She also hit her right side of her eye along the orbital rim. She's got little to say out by the and bruised there was a tiny wound. Chest on her hand. She is able to move her fingers well.  Objective  no acute distress. Alert and oriented. She has a little hematoma right at the end of the lateral aspect of the right eyebrow. There is 1 mm or tear of the skin there. She feels like she has something in her eye. Fluorescein eye exam was done. No scratches were seen. No foreign body was seen. There is a little erythema medially of the cuff however. Fundi are normal. Her lites and has a 3 cm laceration at the base of the fifth finger just along the crit along near the crease. She is able to move her finger well and sensory seems intact.  Assessment: Pain right hand Pain right Laceration right hand Hematoma right side of the orbit  Plan: Since he eye exam was normal will just give her some antibiotic eyedrops. We'll x-ray the right hand before repairing the wound She can use ice off right at. She will probably develop a black eye. tdap  [facility] reading (PRIMARY) by  Dr. Alwyn Ren No fracture seen at right fifth MCP area of trauma .

## 2012-02-10 ENCOUNTER — Ambulatory Visit: Payer: Medicare Other | Admitting: Internal Medicine

## 2012-02-10 ENCOUNTER — Telehealth: Payer: Self-pay | Admitting: Family Medicine

## 2012-02-10 NOTE — Telephone Encounter (Signed)
Spoke to patient on phone and asked her to come in tomorrow.

## 2012-02-11 ENCOUNTER — Ambulatory Visit (INDEPENDENT_AMBULATORY_CARE_PROVIDER_SITE_OTHER): Payer: Medicare Other | Admitting: Family Medicine

## 2012-02-11 VITALS — BP 150/75 | HR 52 | Temp 97.5°F | Resp 18 | Ht 65.25 in | Wt 146.0 lb

## 2012-02-11 DIAGNOSIS — S61409A Unspecified open wound of unspecified hand, initial encounter: Secondary | ICD-10-CM

## 2012-02-11 DIAGNOSIS — S0083XA Contusion of other part of head, initial encounter: Secondary | ICD-10-CM

## 2012-02-11 DIAGNOSIS — M25549 Pain in joints of unspecified hand: Secondary | ICD-10-CM

## 2012-02-11 DIAGNOSIS — S62309A Unspecified fracture of unspecified metacarpal bone, initial encounter for closed fracture: Secondary | ICD-10-CM

## 2012-02-11 NOTE — Patient Instructions (Signed)
Remove the splint every couple of days to look at the cut underneath and make sure it is not getting infected or having any problems.  Return in 10 days on May 24 at about 10 AM for me to recheck this.

## 2012-02-11 NOTE — Progress Notes (Signed)
Subjective: Patient is here for a followup of her hand. I had called her yesterday and told her that the radiologist saw a fracture and we needed to get her back to recheck that she is doing okay. Still is sore on the right were of the orbit. She has a black eye. Her hand has a wound that is doing okay. She does heart in the fifth metacarpal area of the hand.  Objective: Like I is noted. Tenderness of orbital rim. Wound is healing nicely. The sutures are intact. She is tender over the proximal fifth metacarpal. X-ray was reviewed and showed her a picture of it.  Assessment: Fracture right fifth metacarpal Wound recheck Orbital contusion  Plan: PA we'll put on gutter splint of the hand. Return in 10 days. Take it off every couple days to look at the wound.

## 2012-02-21 ENCOUNTER — Ambulatory Visit (INDEPENDENT_AMBULATORY_CARE_PROVIDER_SITE_OTHER): Payer: Medicare Other | Admitting: Family Medicine

## 2012-02-21 ENCOUNTER — Ambulatory Visit: Payer: Medicare Other

## 2012-02-21 VITALS — BP 122/70 | HR 59 | Temp 97.7°F | Resp 16 | Ht 67.0 in | Wt 145.0 lb

## 2012-02-21 DIAGNOSIS — M79609 Pain in unspecified limb: Secondary | ICD-10-CM

## 2012-02-21 DIAGNOSIS — S61409A Unspecified open wound of unspecified hand, initial encounter: Secondary | ICD-10-CM

## 2012-02-21 DIAGNOSIS — S62308A Unspecified fracture of other metacarpal bone, initial encounter for closed fracture: Secondary | ICD-10-CM | POA: Insufficient documentation

## 2012-02-21 DIAGNOSIS — M79643 Pain in unspecified hand: Secondary | ICD-10-CM

## 2012-02-21 NOTE — Progress Notes (Signed)
Subjective: Patient is here for a recheck for tumor right hand. She had a wound on that in need to be rechecked throat, as well as the fracture of the proximal fifth metacarpal. She is having a lot of discomfort between her fingers from the splint. The pain medication we gave her, which I believe was the trauma bowel, made her nauseated and vomited.  Objective: Wound is well-healed. Sutures were removed with a little bit of difficulty. The wound looks intact. She still has some bruising of the hand at the base of the metacarpal the side of the hand. It is almost faded. It is still a little tender in that area. Her right orbit has V. block I still, less prominent than it was before.  Assessment : Wound hand Fracture hand fifth metacarpal  Plan  sutures were removed We'll recheck the x-ray  [facility] reading (PRIMARY) by  Dr. Alwyn Ren fx still visible, nondisplaced, prox 5th metacarlpal.

## 2012-02-21 NOTE — Patient Instructions (Signed)
Wear splint and return in 3 weeks

## 2012-03-05 ENCOUNTER — Ambulatory Visit (INDEPENDENT_AMBULATORY_CARE_PROVIDER_SITE_OTHER): Payer: Medicare Other | Admitting: *Deleted

## 2012-03-05 ENCOUNTER — Encounter: Payer: Self-pay | Admitting: Internal Medicine

## 2012-03-05 ENCOUNTER — Ambulatory Visit (INDEPENDENT_AMBULATORY_CARE_PROVIDER_SITE_OTHER): Payer: Medicare Other | Admitting: Internal Medicine

## 2012-03-05 VITALS — BP 122/64 | HR 52 | Ht 66.5 in | Wt 147.0 lb

## 2012-03-05 DIAGNOSIS — Z7901 Long term (current) use of anticoagulants: Secondary | ICD-10-CM

## 2012-03-05 DIAGNOSIS — I4891 Unspecified atrial fibrillation: Secondary | ICD-10-CM

## 2012-03-05 DIAGNOSIS — I495 Sick sinus syndrome: Secondary | ICD-10-CM

## 2012-03-05 NOTE — Progress Notes (Signed)
PCP: Ginette Otto, MD, MD Primary Cardiologist:  Dr Donetta Potts is a 78 y.o. female who presents today for routine electrophysiology followup.  Since last being seen in our clinic, the patient reports doing very well.  She has had no further afib.  She did trip and fall several weeks ago while walking her dog.  This resulted in fracture of her wrist.  Today, she denies symptoms of palpitations, chest pain, shortness of breath,  lower extremity edema, dizziness, presyncope, or syncope.  She is tolerating amiodarone.  The patient is otherwise without complaint today.   Past Medical History  Diagnosis Date  . Cough   . Hypertension   . Atrial fibrillation   . Cerebrovascular disease, unspecified   . Hyperlipidemia   . Esophageal reflux   . Unspecified disorder of kidney and ureter   . Malignant neoplasm of kidney, except pelvis   . Osteoarthrosis, unspecified whether generalized or localized, unspecified site   . Generalized anxiety disorder   . H/O: hysterectomy 1977  . GERD (gastroesophageal reflux disease)   . Hiatal hernia   . PN (peripheral neuropathy)     in feet   Past Surgical History  Procedure Date  . Appendectomy   . Cholecystectomy   . Bunionectomies      BILATERAL  . Anterior and posterior vaginal repair 1989    AP REPAIR & RAZ URETHROPEXY  . S/p percutaneous cryoablation of right renal cell cancer 07/2009    by [facility]    Current Outpatient Prescriptions  Medication Sig Dispense Refill  . Alum & Mag Hydroxide-Simeth (MAGIC MOUTHWASH) SOLN Take 5 mLs by mouth 2 (two) times daily.        [name] amiodarone (PACERONE) 200 MG tablet Take twice daily for one week then decrease to one daily  35 tablet  11  . amLODipine (NORVASC) 5 MG tablet Take 5 mg by mouth daily.       . Calcium Citrate-Vitamin D (CITRACAL + D PO) Take by mouth daily.      . Cholecalciferol (VITAMIN D3) 1000 UNITS tablet Take 1,000 Units by mouth daily.        . Coenzyme Q10 200 MG  capsule Take 200 mg by mouth daily.      [name] diltiazem (CARDIZEM) 60 MG tablet Take 1 tablet (60 mg total) by mouth once as needed (Take 1 tab at onset and repeat in 2 hours if still in atrial-fib.).  10 tablet  3  . diltiazem (TIAZAC) 240 MG 24 hr capsule Take one by mouth daily  90 capsule  1  . donepezil (ARICEPT) 5 MG tablet Take 5 mg by mouth daily.        [name] ezetimibe (ZETIA) 10 MG tablet Take 10 mg by mouth daily.        . folic acid (FOLVITE) 400 MCG tablet Take 400 mcg by mouth daily.        . Omega-3 Fatty Acids (FISH OIL) 1200 MG CAPS Take by mouth daily.      . pravastatin (PRAVACHOL) 80 MG tablet Take 80 mg by mouth daily.        . promethazine (PHENERGAN) 25 MG tablet Take 12.5 mg by mouth every 6 (six) hours as needed. As needed for nausea.      . sodium chloride (OCEAN) 0.65 % nasal spray Place 2 sprays into the nose as needed.        . warfarin (COUMADIN) 5 MG tablet TAKE AS DIRECTED  45  tablet  1  . zolpidem (AMBIEN) 10 MG tablet Take 10 mg by mouth at bedtime as needed.          Physical Exam: Filed Vitals:   03/05/12 1546  BP: 122/64  Pulse: 52  Height: 5' 6.5" (1.689 m)  Weight: 147 lb (66.679 kg)    GEN- The patient is well appearing, alert and oriented x 3 today.   Head- normocephalic, atraumatic Eyes-  Sclera clear, conjunctiva pink Ears- hearing intact Oropharynx- clear Lungs- Clear to ausculation bilaterally, normal work of breathing Heart- Regular rate and rhythm, no murmurs, rubs or gallops, PMI not laterally displaced GI- soft, NT, ND, + BS Extremities- no clubbing, cyanosis, or edema  ekg today reveals sinus bradycardia 52 bpm, PR 204, Qtc 414, otherwise normal ekg  Assessment and Plan:

## 2012-03-05 NOTE — Assessment & Plan Note (Signed)
Asymptomatic. No changes 

## 2012-03-05 NOTE — Assessment & Plan Note (Signed)
Maintaining sinus rhythm with amiodarone Continue coumadin as per the coumadin clinic  Check TFTs and LFTs upon return in 4 months

## 2012-03-05 NOTE — Patient Instructions (Signed)
Your physician wants you to follow-up in: 4 months with Dr Allred You will receive a reminder letter in the mail two months in advance. If you don't receive a letter, please call our office to schedule the follow-up appointment.  

## 2012-03-06 ENCOUNTER — Ambulatory Visit: Payer: Medicare Other

## 2012-03-06 ENCOUNTER — Ambulatory Visit (INDEPENDENT_AMBULATORY_CARE_PROVIDER_SITE_OTHER): Payer: Medicare Other | Admitting: Family Medicine

## 2012-03-06 ENCOUNTER — Other Ambulatory Visit: Payer: Self-pay | Admitting: Family Medicine

## 2012-03-06 VITALS — BP 154/69 | HR 71 | Temp 98.0°F | Resp 16 | Ht 65.0 in | Wt 146.2 lb

## 2012-03-06 DIAGNOSIS — S62309A Unspecified fracture of unspecified metacarpal bone, initial encounter for closed fracture: Secondary | ICD-10-CM

## 2012-03-06 DIAGNOSIS — M79609 Pain in unspecified limb: Secondary | ICD-10-CM

## 2012-03-06 DIAGNOSIS — M79643 Pain in unspecified hand: Secondary | ICD-10-CM

## 2012-03-06 NOTE — Progress Notes (Signed)
Subjective: Patient injured her right hand last month. She's been wearing the wrist splint for the medical carpal fracture which is in the proximal metacarpal. The wound today she had was in the distal in of the hand by the MCP joint. That is where she is having most problem now with a lot of pain along the incision.  Objective: The wound has a little old excoriation in it. She's been working adequately she thought it might be a foreign object such as a suture herpes or gravel left in there. This was examined magnifying it with the otoscope and I do not see any remaining Fred. I picked up small pieces of scab in there is no threadlike finding them. The incision is very very tender.  Assessment: Fracture metacarpal followup Wound pain  Plan Repeat x-ray and look to see if I see a foreign bodies in that area also. Probably will just have to just give it time and see what evolves from this.  [facility] reading (PRIMARY) by  Dr. Alwyn Ren Fracture healing.  No fb visible over the distal end of the metacarpal where it is so tender in the site of the old laceration    I do not see any foreign object in the area of the wound. I think that her problem is still just wound pain, maybe from the scar development there. I wonder if the wrist splint has not been pressing on there too much. We will stop using the splint since it has been 4 weeks, and see how she does. If over the next 2-3 weeks the pain is not resolving she is to let me know. If there is any gravel or foreign object in there he should work its way on out. It is safer not to dig into it.

## 2012-03-06 NOTE — Patient Instructions (Signed)
Return if pain not improving over next few weeks.

## 2012-03-11 ENCOUNTER — Encounter: Payer: Self-pay | Admitting: *Deleted

## 2012-03-12 ENCOUNTER — Ambulatory Visit (INDEPENDENT_AMBULATORY_CARE_PROVIDER_SITE_OTHER): Payer: Medicare Other | Admitting: *Deleted

## 2012-03-12 DIAGNOSIS — Z7901 Long term (current) use of anticoagulants: Secondary | ICD-10-CM

## 2012-03-12 DIAGNOSIS — I4891 Unspecified atrial fibrillation: Secondary | ICD-10-CM

## 2012-03-16 ENCOUNTER — Encounter: Payer: Self-pay | Admitting: Internal Medicine

## 2012-03-16 ENCOUNTER — Other Ambulatory Visit: Payer: Self-pay | Admitting: Pulmonary Disease

## 2012-03-30 ENCOUNTER — Ambulatory Visit (INDEPENDENT_AMBULATORY_CARE_PROVIDER_SITE_OTHER): Payer: Medicare Other | Admitting: *Deleted

## 2012-03-30 DIAGNOSIS — I4891 Unspecified atrial fibrillation: Secondary | ICD-10-CM

## 2012-03-30 DIAGNOSIS — Z7901 Long term (current) use of anticoagulants: Secondary | ICD-10-CM

## 2012-03-31 ENCOUNTER — Ambulatory Visit (INDEPENDENT_AMBULATORY_CARE_PROVIDER_SITE_OTHER): Payer: Medicare Other | Admitting: Internal Medicine

## 2012-03-31 ENCOUNTER — Encounter: Payer: Self-pay | Admitting: Internal Medicine

## 2012-03-31 VITALS — BP 108/50 | HR 52 | Ht 65.5 in | Wt 147.0 lb

## 2012-03-31 DIAGNOSIS — K5901 Slow transit constipation: Secondary | ICD-10-CM

## 2012-03-31 DIAGNOSIS — R159 Full incontinence of feces: Secondary | ICD-10-CM

## 2012-03-31 NOTE — Progress Notes (Signed)
Anne Hayes [birth year] MRN [id number]   History of Present Illness:  This is a 78 year old white female with chronic constipation and recent leakage of stool after taking stool softeners and prunes. We saw her in the past for a colonoscopy in November 2007 and prior to that in 1998 which showed prep induced mucosal abrasions in the rectum but was an otherwise normal exam. There was no significant diverticulosis and there is no family history of colon cancer. She strains excessively but denies rectal bleeding. She is complaining of lower abdominal pain. Her recent hemoglobin was 13.5 and her hematocrit was 40.4. She has a history of atrial fibrillation and has been on Coumadin. She has a history of a nephrectomy and cholecystectomy.   Past Medical History  Diagnosis Date  . Esophageal stricture   . Hypertension   . Atrial fibrillation   . Cerebrovascular disease, unspecified   . Hyperlipidemia   . DJD (degenerative joint disease)   . Unspecified disorder of kidney and ureter   . Renal cancer   . Osteoarthrosis, unspecified whether generalized or localized, unspecified site   . Generalized anxiety disorder   . GERD (gastroesophageal reflux disease)   . Hiatal hernia   . PN (peripheral neuropathy)     in feet  . Rectal fissure   . Gastric ulcer   . IBS (irritable bowel syndrome)   . Insomnia    Past Surgical History  Procedure Date  . Appendectomy   . Cholecystectomy   . Bunionectomies      BILATERAL  . Anterior and posterior vaginal repair 1989    AP REPAIR & RAZ URETHROPEXY  . S/p percutaneous cryoablation of right renal cell cancer 07/2009    by [name]  . Partial hysterectomy 1977    reports that she quit smoking about 23 years ago. Her smoking use included Cigarettes. She has a 50 pack-year smoking history. She has never used smokeless tobacco. She reports that she drinks alcohol. She reports that she does not use illicit drugs. family history includes Heart disease  in her father and Stroke in her mother.  There is no history of Colon cancer. Allergies  Allergen Reactions  . Codeine     REACTION: nausea/vomiting  . Penicillins     REACTION: nausea/vomiting  . Sulfa Antibiotics Itching        Review of Systems: Denies dysphagia odynophagia or shortness of breath or chest pain  The remainder of the 10 point ROS is negative except as outlined in H&P   Physical Exam: General appearance  Well developed, in no distress. Eyes- non icteric. HEENT nontraumatic, normocephalic. Mouth no lesions, tongue papillated, no cheilosis. Neck supple without adenopathy, thyroid not enlarged, no carotid bruits, no JVD. Lungs Clear to auscultation bilaterally. Cor normal S1, normal S2, regular rhythm, no murmur,  quiet precordium. Abdomen: Mildly protuberant. Soft with normoactive bowel sounds. Tenderness in left lower quadrant with fullness in the left colon which could represent astool or mass. Rectal: Moderate amount of formed Hemoccult negative stool. Extremities no pedal edema. Skin no lesions. Neurological alert and oriented x 3. Psychological normal mood and affect.  Assessment and Plan:  Problem #1 Lower abdominal pain in a patient with chronic constipation and difficult evacuation. We need to rule out an obstructing lesion of the left colon. Her last colonoscopy was 6 years ago. We will give her moviprep and proceed with a colonoscopy while she is taking Coumadin. There may be a presence of a rectocele that is causing difficulty  in evacuating stool. Her chronic incontinence may be due to overflow constipation. Her rectal sphincter tone seems to be adequate.   03/31/2012 Lina Sar

## 2012-03-31 NOTE — Patient Instructions (Addendum)
You have been scheduled for a colonoscopy with propofol. Please follow written instructions given to you at your visit today.  Please pick up your prep kit at the pharmacy within the next 1-3 days. CC: Dr Merlene Laughter

## 2012-04-01 ENCOUNTER — Encounter: Payer: Self-pay | Admitting: Internal Medicine

## 2012-04-01 ENCOUNTER — Ambulatory Visit (AMBULATORY_SURGERY_CENTER): Payer: Medicare Other | Admitting: Internal Medicine

## 2012-04-01 VITALS — BP 142/72 | HR 56 | Temp 96.4°F | Resp 18 | Ht 65.0 in | Wt 147.0 lb

## 2012-04-01 DIAGNOSIS — R159 Full incontinence of feces: Secondary | ICD-10-CM

## 2012-04-01 DIAGNOSIS — D126 Benign neoplasm of colon, unspecified: Secondary | ICD-10-CM

## 2012-04-01 DIAGNOSIS — K5900 Constipation, unspecified: Secondary | ICD-10-CM

## 2012-04-01 DIAGNOSIS — R109 Unspecified abdominal pain: Secondary | ICD-10-CM

## 2012-04-01 MED ORDER — SODIUM CHLORIDE 0.9 % IV SOLN: 500.0000 mL | INTRAVENOUS | Status: DC

## 2012-04-01 NOTE — Op Note (Signed)
[facility] [address] [place], [place]  [address]  COLONOSCOPY PROCEDURE REPORT  PATIENT:  Anne Hayes, Anne Hayes  MR#:  [id number] BIRTHDATE:  September 17, 1924, 87 yrs. old  GENDER:  female ENDOSCOPIST:  Hedwig Morton. Juanda Chance, MD REF. BY:  Merlene Laughter, M.D. PROCEDURE DATE:  04/01/2012 PROCEDURE:  Colonoscopy with snare polypectomy ASA CLASS:  Class II INDICATIONS:  Abdominal pain, constipation loast colon 2007, MEDICATIONS:   MAC sedation, administered by CRNA, propofol (Diprivan) 140 mg  DESCRIPTION OF PROCEDURE:   After the risks and benefits and of the procedure were explained, informed consent was obtained. Digital rectal exam was performed and revealed no rectal masses. The LB CF-[id number] endoscope was introduced through the anus and advanced to the cecum, which was identified by both the appendix and ileocecal valve.  The quality of the prep was good, using MoviPrep.  The instrument was then slowly withdrawn as the colon was fully examined. <<PROCEDUREIMAGES>>  FINDINGS:  A sessile polyp was found. 5 mm polyp at 35 cm Polyp was snared without cautery. Retrieval was successful (see image2, image6, image7, image9, and image8). snare polyp endoscopic clip placement endoclip placed over the polypectomy site, no bleeding Moderate diverticulosis was found in the sigmoid colon (see image1).  This was otherwise a normal examination of the colon (see image3, image4, image5, and image10).   Retroflexed views in the rectum revealed no abnormalities.    The scope was then withdrawn from the patient and the procedure completed.  COMPLICATIONS:  None ENDOSCOPIC IMPRESSION: 1) Sessile polyp 2) Moderate diverticulosis in the sigmoid colon 3) Otherwise normal examination placement of an endoclip over the polypectomy site, no bleeding observed, RECOMMENDATIONS: 1) Await biopsy results hold Counmadin x 1 week, then resume Metamucil 1 tsp po qd Miralax 17 gm po 3x/week  REPEAT  EXAM:  In 0 year(s) for.  no recall due to age  ______________________________ Hedwig Morton. Juanda Chance, MD  CC:  n. eSIGNED:   Hedwig Morton. Surya Schroeter at 04/01/2012 08:46 AM  [place], Windell M[place], [phone]

## 2012-04-01 NOTE — Patient Instructions (Addendum)
HOLD YOUR COUMADIN X ONE WEEK, July 10,2013.  START METAMUCIL 1 TEASPOON BY MOUTH EVERY DAY.  MIRALAX 17 GM. BY MOUTH 3 TIMES PER WEEK.  AWAIT BIOPSY RESULTS.  YOU HAD AN ENDOSCOPIC PROCEDURE TODAY AT THE [facility]: Refer to the procedure report that was given to you for any specific questions about what was found during the examination.  If the procedure report does not answer your questions, please call your gastroenterologist to clarify.  If you requested that your care partner not be given the details of your procedure findings, then the procedure report has been included in a sealed envelope for you to review at your convenience later.  YOU SHOULD EXPECT: Some feelings of bloating in the abdomen. Passage of more gas than usual.  Walking can help get rid of the air that was put into your GI tract during the procedure and reduce the bloating. If you had a lower endoscopy (such as a colonoscopy or flexible sigmoidoscopy) you may notice spotting of blood in your stool or on the toilet paper. If you underwent a bowel prep for your procedure, then you may not have a normal bowel movement for a few days.  DIET: Your first meal following the procedure should be a light meal and then it is ok to progress to your normal diet.  A half-sandwich or bowl of soup is an example of a good first meal.  Heavy or fried foods are harder to digest and may make you feel nauseous or bloated.  Likewise meals heavy in dairy and vegetables can cause extra gas to form and this can also increase the bloating.  Drink plenty of fluids but you should avoid alcoholic beverages for 24 hours.  ACTIVITY: Your care partner should take you home directly after the procedure.  You should plan to take it easy, moving slowly for the rest of the day.  You can resume normal activity the day after the procedure however you should NOT DRIVE or use heavy machinery for 24 hours (because of the sedation medicines used during the  test).    SYMPTOMS TO REPORT IMMEDIATELY: A gastroenterologist can be reached at any hour.  During normal business hours, 8:30 AM to 5:00 PM Monday through Friday, call [phone].  After hours and on weekends, please call the GI answering service at [phone] who will take a message and have the physician on call contact you.   Following lower endoscopy (colonoscopy or flexible sigmoidoscopy):  Excessive amounts of blood in the stool  Significant tenderness or worsening of abdominal pains  Swelling of the abdomen that is new, acute  Fever of 100F or higher  Following upper endoscopy (EGD)  Vomiting of blood or coffee ground material  New chest pain or pain under the shoulder blades  Painful or persistently difficult swallowing  New shortness of breath  Fever of 100F or higher  Black, tarry-looking stools  FOLLOW UP: If any biopsies were taken you will be contacted by phone or by letter within the next 1-3 weeks.  Call your gastroenterologist if you have not heard about the biopsies in 3 weeks.  Our staff will call the home number listed on your records the next business day following your procedure to check on you and address any questions or concerns that you may have at that time regarding the information given to you following your procedure. This is a courtesy call and so if there is no answer at the home number and  we have not heard from you through the emergency physician on call, we will assume that you have returned to your regular daily activities without incident.  SIGNATURES/CONFIDENTIALITY: You and/or your care partner have signed paperwork which will be entered into your electronic medical record.  These signatures attest to the fact that that the information above on your After Visit Summary has been reviewed and is understood.  Full responsibility of the confidentiality of this discharge information lies with you and/or your care-partner.

## 2012-04-01 NOTE — Progress Notes (Signed)
Patient did not experience any of the following events: a burn prior to discharge; a fall within the facility; wrong site/side/patient/procedure/implant event; or a hospital transfer or hospital admission upon discharge from the facility. (G8907) Patient did not have preoperative order for IV antibiotic SSI prophylaxis. (G8918)  

## 2012-04-03 ENCOUNTER — Telehealth: Payer: Self-pay | Admitting: *Deleted

## 2012-04-03 NOTE — Telephone Encounter (Signed)
  Follow up Call-  Call back number 04/01/2012  Post procedure Call Back phone  # [phone]  Permission to leave phone message Yes     Patient questions:  Do you have a fever, pain , or abdominal swelling? no Pain Score  0 *  Have you tolerated food without any problems? yes  Have you been able to return to your normal activities? yes  Do you have any questions about your discharge instructions: Diet   no Medications  no Follow up visit  no  Do you have questions or concerns about your Care? no  Actions: * If pain score is 4 or above: No action needed, pain <4.pt did express concern about incontinence of stool at times. Pt says she has had this problem prior to procedure, but thought that this procedure would resolve this. Explained to pt that the procedure would not have changed this but that think this is probably why Dr. Juanda Chance ordered the miralax and metamucil regimen and we discussesd and reviewed how that was ordered.pt felt satisfied with this explanation and i encouraged her to call back if problem does not improve over couple of weeks.

## 2012-04-06 ENCOUNTER — Encounter: Payer: Self-pay | Admitting: Internal Medicine

## 2012-04-06 ENCOUNTER — Telehealth: Payer: Self-pay | Admitting: Cardiology

## 2012-04-06 NOTE — Telephone Encounter (Signed)
Pt was taken off coumadin due to a colonoscopy and she is concerned she is off too long and she wants to someone about it

## 2012-04-06 NOTE — Telephone Encounter (Signed)
Pt calling b/c she had a colonoscopy with polyp removal on July 3rd.  She is to restart Coumadin on July 10th. Coumadin clinic on July 15th.  coumadin clinic aware as these were Dr. Regino Schultze instructions to the pt. Left message on pt voicemail at her request. ( she is out walking her dog) Mylo Red RN

## 2012-04-13 ENCOUNTER — Ambulatory Visit (INDEPENDENT_AMBULATORY_CARE_PROVIDER_SITE_OTHER): Payer: Medicare Other | Admitting: *Deleted

## 2012-04-13 DIAGNOSIS — I4891 Unspecified atrial fibrillation: Secondary | ICD-10-CM

## 2012-04-13 DIAGNOSIS — Z7901 Long term (current) use of anticoagulants: Secondary | ICD-10-CM

## 2012-04-13 LAB — POCT INR: INR: 1.1

## 2012-04-17 ENCOUNTER — Ambulatory Visit (INDEPENDENT_AMBULATORY_CARE_PROVIDER_SITE_OTHER): Payer: Medicare Other

## 2012-04-17 DIAGNOSIS — Z7901 Long term (current) use of anticoagulants: Secondary | ICD-10-CM

## 2012-04-17 DIAGNOSIS — I4891 Unspecified atrial fibrillation: Secondary | ICD-10-CM

## 2012-04-17 LAB — POCT INR: INR: 1.5

## 2012-05-01 ENCOUNTER — Ambulatory Visit (INDEPENDENT_AMBULATORY_CARE_PROVIDER_SITE_OTHER): Payer: Medicare Other | Admitting: Pharmacist

## 2012-05-01 DIAGNOSIS — Z7901 Long term (current) use of anticoagulants: Secondary | ICD-10-CM

## 2012-05-01 DIAGNOSIS — I4891 Unspecified atrial fibrillation: Secondary | ICD-10-CM

## 2012-05-07 ENCOUNTER — Encounter: Payer: Self-pay | Admitting: *Deleted

## 2012-05-12 ENCOUNTER — Ambulatory Visit: Payer: Medicare Other | Admitting: Internal Medicine

## 2012-05-18 ENCOUNTER — Ambulatory Visit (INDEPENDENT_AMBULATORY_CARE_PROVIDER_SITE_OTHER): Payer: Medicare Other | Admitting: *Deleted

## 2012-05-18 DIAGNOSIS — Z7901 Long term (current) use of anticoagulants: Secondary | ICD-10-CM

## 2012-05-18 DIAGNOSIS — I4891 Unspecified atrial fibrillation: Secondary | ICD-10-CM

## 2012-05-21 ENCOUNTER — Other Ambulatory Visit: Payer: Self-pay | Admitting: Family Medicine

## 2012-05-25 ENCOUNTER — Emergency Department (HOSPITAL_COMMUNITY)
Admission: EM | Admit: 2012-05-25 | Discharge: 2012-05-25 | Disposition: A | Discharge status: Home / Self Care | Payer: Medicare Other | Attending: Emergency Medicine | Admitting: Emergency Medicine

## 2012-05-25 ENCOUNTER — Telehealth: Payer: Self-pay | Admitting: Internal Medicine

## 2012-05-25 ENCOUNTER — Encounter (HOSPITAL_COMMUNITY): Payer: Self-pay | Admitting: *Deleted

## 2012-05-25 DIAGNOSIS — Z8673 Personal history of transient ischemic attack (TIA), and cerebral infarction without residual deficits: Secondary | ICD-10-CM | POA: Insufficient documentation

## 2012-05-25 DIAGNOSIS — M1990 Unspecified osteoarthritis, unspecified site: Secondary | ICD-10-CM | POA: Insufficient documentation

## 2012-05-25 DIAGNOSIS — G4700 Insomnia, unspecified: Secondary | ICD-10-CM | POA: Insufficient documentation

## 2012-05-25 DIAGNOSIS — I10 Essential (primary) hypertension: Secondary | ICD-10-CM | POA: Insufficient documentation

## 2012-05-25 DIAGNOSIS — K219 Gastro-esophageal reflux disease without esophagitis: Secondary | ICD-10-CM | POA: Insufficient documentation

## 2012-05-25 DIAGNOSIS — Z85528 Personal history of other malignant neoplasm of kidney: Secondary | ICD-10-CM | POA: Insufficient documentation

## 2012-05-25 DIAGNOSIS — K921 Melena: Secondary | ICD-10-CM | POA: Insufficient documentation

## 2012-05-25 DIAGNOSIS — Z87891 Personal history of nicotine dependence: Secondary | ICD-10-CM | POA: Insufficient documentation

## 2012-05-25 DIAGNOSIS — Z79899 Other long term (current) drug therapy: Secondary | ICD-10-CM | POA: Insufficient documentation

## 2012-05-25 DIAGNOSIS — R109 Unspecified abdominal pain: Secondary | ICD-10-CM | POA: Insufficient documentation

## 2012-05-25 DIAGNOSIS — Z7901 Long term (current) use of anticoagulants: Secondary | ICD-10-CM | POA: Insufficient documentation

## 2012-05-25 DIAGNOSIS — K625 Hemorrhage of anus and rectum: Secondary | ICD-10-CM

## 2012-05-25 DIAGNOSIS — E785 Hyperlipidemia, unspecified: Secondary | ICD-10-CM | POA: Insufficient documentation

## 2012-05-25 DIAGNOSIS — I4891 Unspecified atrial fibrillation: Secondary | ICD-10-CM | POA: Insufficient documentation

## 2012-05-25 LAB — CBC
Hemoglobin: 14.8 g/dL (ref 12.0–15.0)
MCV: 93.5 fL (ref 78.0–100.0)
Platelets: 189 10*3/uL (ref 150–400)
RBC: 4.61 MIL/uL (ref 3.87–5.11)
WBC: 8.1 10*3/uL (ref 4.0–10.5)

## 2012-05-25 LAB — COMPREHENSIVE METABOLIC PANEL
ALT: 22 U/L (ref 0–35)
AST: 26 U/L (ref 0–37)
CO2: 27 mEq/L (ref 19–32)
Chloride: 101 mEq/L (ref 96–112)
Creatinine, Ser: 0.93 mg/dL (ref 0.50–1.10)
GFR calc non Af Amer: 54 mL/min — ABNORMAL LOW (ref >90)
Sodium: 136 mEq/L (ref 135–145)
Total Bilirubin: 0.4 mg/dL (ref 0.3–1.2)

## 2012-05-25 NOTE — ED Notes (Signed)
JYN:[id number]<AO> Expected date:<BR> Expected time:<BR> Means of arrival:Ambulance<BR> Comments:<BR> 87yoF- abd pain; black tarry stools

## 2012-05-25 NOTE — ED Notes (Signed)
Per EMS pt in from home c/o generalized abd pain and dark stools x's 3-4 weeks with increasing fatigue.

## 2012-05-25 NOTE — Telephone Encounter (Signed)
I agree with plans to go to the ED

## 2012-05-25 NOTE — ED Notes (Signed)
Pt waiting for ptar to return to [facility].

## 2012-05-25 NOTE — Telephone Encounter (Signed)
Patient calling to report black stools and stomach pain. States the abdominal pain is in the belly button area and is worse after eating.She reports black diarrhea stool with bright, red blood in it yesterday. She is on Coumadin. She reports feeling weaker today. Denies being on iron or fever. Last colonoscopy 04/01/12- adenomatous polyp, Hx GERD. Patient instructed to go to ED for evaluation. Dr Juanda Chance out of office. Dr. Judd Lien

## 2012-05-25 NOTE — ED Provider Notes (Signed)
History     CSN: [id number]  Arrival date & time 05/25/12  1156   First MD Initiated Contact with Patient 05/25/12 1202      Chief Complaint  Patient presents with  . Abdominal Pain  . Melena    (Consider location/radiation/quality/duration/timing/severity/associated sxs/prior treatment) HPI Comments: [name] presents for evaluation of abdominal discomfort and black stools.  She explains that she has experienced a progressively worsening fatigue over the last month.  She reports feeling tired constantly and feels as if it takes great effort to complete simple task.  She reports that even prior to the onset of fatigue she was having intermittent loose stools.  She also was having black stools at times.  Over the last week she reports that her stools have been "as black as the ace of spades".  She has also had mild to moderate cramping lowe abdominal discomfort and a sm amt of bright red blood on the tissue after wiping.  Lastly she states she has had a worsening or more frequent fecal incontinence.  She takes coumadin but denies syncope, dizziness, CP, SOB, palpitations, nausea, vomiting, decreased appetite, or fever.  Patient is a 78 y.o. female presenting with GI illness. The history is provided by the patient. No language interpreter was used.  GI Problem  This is a recurrent problem. The current episode started more than 1 week ago. The problem occurs 2 to 4 times per day. The problem has been gradually worsening. Diarrhea characteristics: dark black stools. There has been no fever. Associated symptoms include abdominal pain. Pertinent negatives include no vomiting, no chills, no sweats, no headaches, no arthralgias, no myalgias, no URI and no cough. She has tried nothing for the symptoms.    Past Medical History  Diagnosis Date  . Esophageal stricture   . Hypertension   . Atrial fibrillation   . Cerebrovascular disease, unspecified   . Hyperlipidemia   . DJD (degenerative joint  disease)   . Unspecified disorder of kidney and ureter   . Renal cancer   . Osteoarthrosis, unspecified whether generalized or localized, unspecified site   . Generalized anxiety disorder   . GERD (gastroesophageal reflux disease)   . Hiatal hernia   . PN (peripheral neuropathy)     in feet  . Rectal fissure   . Gastric ulcer   . IBS (irritable bowel syndrome)   . Insomnia   . Adenomatous colon polyp 2013  . Diverticulosis     Past Surgical History  Procedure Date  . Appendectomy   . Cholecystectomy   . Bunionectomies      BILATERAL  . Anterior and posterior vaginal repair 1989    AP REPAIR & RAZ URETHROPEXY  . S/p percutaneous cryoablation of right renal cell cancer 07/2009    by [name]  . Partial hysterectomy 1977    Family History  Problem Relation Age of Onset  . Heart disease Father   . Colon cancer Neg Hx   . Stroke Mother     History  Substance Use Topics  . Smoking status: Former Smoker -- 1.0 packs/day for 50 years    Types: Cigarettes    Quit date: 09/30/1988  . Smokeless tobacco: Never Used  . Alcohol Use: 1.2 oz/week    2 Glasses of wine per week     glass of wine with dinner    OB History    Grav Para Term Preterm Abortions TAB SAB Ect Mult Living  Review of Systems  Constitutional: Positive for fatigue. Negative for fever, chills, diaphoresis, appetite change and unexpected weight change.  HENT: Negative.   Eyes: Negative.   Respiratory: Negative for cough, chest tightness, shortness of breath and stridor.   Cardiovascular: Negative.   Gastrointestinal: Positive for abdominal pain, diarrhea, constipation, blood in stool and anal bleeding. Negative for nausea, vomiting, abdominal distention and rectal pain.  Musculoskeletal: Negative for myalgias, back pain and arthralgias.  Skin: Negative.   Neurological: Positive for weakness. Negative for dizziness, tremors, syncope, light-headedness, numbness and headaches.    Psychiatric/Behavioral: Negative.     Allergies  Codeine; Penicillins; and Sulfa antibiotics  Home Medications   Current Outpatient Rx  Name Route Sig Dispense Refill  . MAGIC MOUTHWASH Oral Take 5 mLs by mouth 2 (two) times daily.      . AMIODARONE HCL 200 MG PO TABS  Take twice daily for one week then decrease to one daily 35 tablet 11  . AMLODIPINE BESYLATE 5 MG PO TABS Oral Take 5 mg by mouth daily.     [name] CITRACAL + D PO Oral Take by mouth daily.    [name] VITAMIN D3 1000 UNITS PO TABS Oral Take 1,000 Units by mouth daily.      [name] COENZYME Q10 200 MG PO CAPS Oral Take 200 mg by mouth daily.    . DONEPEZIL HCL 5 MG PO TABS Oral Take 5 mg by mouth daily.      [name] EZETIMIBE 10 MG PO TABS Oral Take 10 mg by mouth daily.      [name] FOLIC ACID 400 MCG PO TABS Oral Take 400 mcg by mouth daily.      [name] FISH OIL 1200 MG PO CAPS Oral Take by mouth daily.    [name] PRAVASTATIN SODIUM 80 MG PO TABS Oral Take 80 mg by mouth daily.      [name] PROMETHAZINE HCL 25 MG PO TABS Oral Take 12.5 mg by mouth every 6 (six) hours as needed. As needed for nausea.    [name] SALINE NASAL SPRAY 0.65 % NA SOLN Nasal Place 2 sprays into the nose as needed.      . WARFARIN SODIUM 5 MG PO TABS  TAKE AS DIRECTED 45 tablet 1  . ZOLPIDEM TARTRATE 10 MG PO TABS Oral Take 10 mg by mouth at bedtime as needed.        BP 163/72  Pulse 53  Temp 98 F (36.7 C) (Oral)  Resp 18  SpO2 98%  Physical Exam  Nursing note and vitals reviewed. Constitutional: She is oriented to person, place, and time. She appears well-developed and well-nourished. No distress.  HENT:  Head: Normocephalic and atraumatic.  Right Ear: External ear normal.  Left Ear: External ear normal.  Nose: Nose normal.  Mouth/Throat: Oropharynx is clear and moist. No oropharyngeal exudate.  Eyes: Conjunctivae and EOM are normal. Pupils are equal, round, and reactive to light. Right eye exhibits no discharge. Left eye exhibits no discharge. No scleral icterus.  Neck: Normal  range of motion. Neck supple. No JVD present. No tracheal deviation present. No thyromegaly present.  Cardiovascular: S1 normal, S2 normal, normal heart sounds, intact distal pulses and normal pulses.  Bradycardia present.  PMI is not displaced.  Exam reveals no gallop, no distant heart sounds, no friction rub and no decreased pulses.   No murmur heard. Pulmonary/Chest: Effort normal and breath sounds normal. No stridor. No respiratory distress. She has no wheezes. She has no rales. She exhibits no  tenderness.  Abdominal: Soft. Bowel sounds are normal. She exhibits no distension, no abdominal bruit, no ascites, no pulsatile midline mass and no mass. There is no hepatosplenomegaly. There is generalized tenderness. There is no rigidity, no rebound, no guarding, no CVA tenderness, no tenderness at McBurney's point and negative Murphy's sign. No hernia.  Musculoskeletal: Normal range of motion. She exhibits no edema and no tenderness.  Lymphadenopathy:    She has no cervical adenopathy.  Neurological: She is alert and oriented to person, place, and time. No cranial nerve deficit.  Skin: Skin is warm and dry. No rash noted. She is not diaphoretic. No erythema. No pallor.  Psychiatric: She has a normal mood and affect. Her behavior is normal. Judgment and thought content normal.    ED Course  Procedures (including critical care time)   Labs Reviewed  CBC  COMPREHENSIVE METABOLIC PANEL  PROTIME-INR   No results found.   No diagnosis found.    MDM  Pt presents for evaluation of weakness and black stools.  She also takes coumadin secondary to atrial fibrillation.  She appears comfortable, NAD.  Plan labs including CBC, CMP, INR, and type and screen.  If evidence of melena or melena with a secondary anemia is noted, plan refer for admission and colonoscopy.  Pt appears hemodynamically currently.  1500.  Pt appears comfortable, NAD.  Very vague, diffuse, mild abdominal tenderness on repeat exam.   Hemoccult noted to be negative and there was no gross blood on exam.  Note INR to be in the therapeutic range and H&H is also normal.  No electrolyte abnormalities noted.  Plan discharge home to f/u with her primary physician.      Tobin Chad, MD 05/25/12 [phone]

## 2012-06-04 ENCOUNTER — Emergency Department (HOSPITAL_COMMUNITY): Payer: Medicare Other

## 2012-06-04 ENCOUNTER — Emergency Department (HOSPITAL_COMMUNITY)
Admission: EM | Admit: 2012-06-04 | Discharge: 2012-06-04 | Disposition: A | Discharge status: Home / Self Care | Payer: Medicare Other | Attending: Emergency Medicine | Admitting: Emergency Medicine

## 2012-06-04 ENCOUNTER — Encounter (HOSPITAL_COMMUNITY): Payer: Self-pay

## 2012-06-04 DIAGNOSIS — Z79899 Other long term (current) drug therapy: Secondary | ICD-10-CM | POA: Insufficient documentation

## 2012-06-04 DIAGNOSIS — K219 Gastro-esophageal reflux disease without esophagitis: Secondary | ICD-10-CM | POA: Insufficient documentation

## 2012-06-04 DIAGNOSIS — I10 Essential (primary) hypertension: Secondary | ICD-10-CM | POA: Insufficient documentation

## 2012-06-04 DIAGNOSIS — Z7901 Long term (current) use of anticoagulants: Secondary | ICD-10-CM | POA: Insufficient documentation

## 2012-06-04 DIAGNOSIS — Z8673 Personal history of transient ischemic attack (TIA), and cerebral infarction without residual deficits: Secondary | ICD-10-CM | POA: Insufficient documentation

## 2012-06-04 DIAGNOSIS — Y92009 Unspecified place in unspecified non-institutional (private) residence as the place of occurrence of the external cause: Secondary | ICD-10-CM | POA: Insufficient documentation

## 2012-06-04 DIAGNOSIS — R531 Weakness: Secondary | ICD-10-CM

## 2012-06-04 DIAGNOSIS — R5381 Other malaise: Secondary | ICD-10-CM | POA: Insufficient documentation

## 2012-06-04 DIAGNOSIS — I4891 Unspecified atrial fibrillation: Secondary | ICD-10-CM | POA: Insufficient documentation

## 2012-06-04 DIAGNOSIS — Z9089 Acquired absence of other organs: Secondary | ICD-10-CM | POA: Insufficient documentation

## 2012-06-04 DIAGNOSIS — S20219A Contusion of unspecified front wall of thorax, initial encounter: Secondary | ICD-10-CM | POA: Insufficient documentation

## 2012-06-04 DIAGNOSIS — Z87891 Personal history of nicotine dependence: Secondary | ICD-10-CM | POA: Insufficient documentation

## 2012-06-04 DIAGNOSIS — W19XXXA Unspecified fall, initial encounter: Secondary | ICD-10-CM | POA: Insufficient documentation

## 2012-06-04 DIAGNOSIS — K589 Irritable bowel syndrome without diarrhea: Secondary | ICD-10-CM | POA: Insufficient documentation

## 2012-06-04 DIAGNOSIS — W010XXA Fall on same level from slipping, tripping and stumbling without subsequent striking against object, initial encounter: Secondary | ICD-10-CM

## 2012-06-04 DIAGNOSIS — E785 Hyperlipidemia, unspecified: Secondary | ICD-10-CM | POA: Insufficient documentation

## 2012-06-04 LAB — CBC WITH DIFFERENTIAL/PLATELET
Eosinophils Absolute: 0.2 10*3/uL (ref 0.0–0.7)
Eosinophils Relative: 2 % (ref 0–5)
Hemoglobin: 12.9 g/dL (ref 12.0–15.0)
Lymphs Abs: 1.2 10*3/uL (ref 0.7–4.0)
MCH: 31.3 pg (ref 26.0–34.0)
MCV: 92.7 fL (ref 78.0–100.0)
Monocytes Relative: 8 % (ref 3–12)
RBC: 4.12 MIL/uL (ref 3.87–5.11)

## 2012-06-04 LAB — URINALYSIS, ROUTINE W REFLEX MICROSCOPIC
Glucose, UA: NEGATIVE mg/dL
Hgb urine dipstick: NEGATIVE
Ketones, ur: NEGATIVE mg/dL
Protein, ur: NEGATIVE mg/dL

## 2012-06-04 LAB — BASIC METABOLIC PANEL
CO2: 24 mEq/L (ref 19–32)
Calcium: 8.9 mg/dL (ref 8.4–10.5)
Glucose, Bld: 96 mg/dL (ref 70–99)
Sodium: 136 mEq/L (ref 135–145)

## 2012-06-04 LAB — PROTIME-INR: INR: 2.22 — ABNORMAL HIGH (ref 0.00–1.49)

## 2012-06-04 LAB — URINE MICROSCOPIC-ADD ON

## 2012-06-04 NOTE — ED Provider Notes (Signed)
History     CSN: [id number]  Arrival date & time 06/04/12  0031   First MD Initiated Contact with Patient 06/04/12 0105      Chief Complaint  Patient presents with  . Weakness    (Consider location/radiation/quality/duration/timing/severity/associated sxs/prior treatment) HPI 78 year old female presents emergency department from independent living facility with report of fall. Patient reports she was walking around getting ready for bed when she lost her balance and fell in between the dresser and bed. Patient reports prior to fall and she felt clumsy and weak. She reports she taken all her nighttime meds. Patient complaining of pain to her right back. She denies any shortness breath, she denies striking her head or neck, she denies any LOC. Patient denies any new medications, but is confused in how long she has been on Ambien and when she has taken it last.   Past Medical History  Diagnosis Date  . Esophageal stricture   . Hypertension   . Atrial fibrillation   . Cerebrovascular disease, unspecified   . Hyperlipidemia   . DJD (degenerative joint disease)   . Unspecified disorder of kidney and ureter   . Renal cancer   . Osteoarthrosis, unspecified whether generalized or localized, unspecified site   . Generalized anxiety disorder   . GERD (gastroesophageal reflux disease)   . Hiatal hernia   . PN (peripheral neuropathy)     in feet  . Rectal fissure   . Gastric ulcer   . IBS (irritable bowel syndrome)   . Insomnia   . Adenomatous colon polyp 2013  . Diverticulosis     Past Surgical History  Procedure Date  . Appendectomy   . Cholecystectomy   . Bunionectomies      BILATERAL  . Anterior and posterior vaginal repair 1989    AP REPAIR & RAZ URETHROPEXY  . S/p percutaneous cryoablation of right renal cell cancer 07/2009    by [name]  . Partial hysterectomy 1977    Family History  Problem Relation Age of Onset  . Heart disease Father   . Colon cancer Neg Hx     . Stroke Mother     History  Substance Use Topics  . Smoking status: Former Smoker -- 1.0 packs/day for 50 years    Types: Cigarettes    Quit date: 09/30/1988  . Smokeless tobacco: Never Used  . Alcohol Use: 1.2 oz/week    2 Glasses of wine per week     glass of wine with dinner    OB History    Grav Para Term Preterm Abortions TAB SAB Ect Mult Living                  Review of Systems  Unable to perform ROS: Other  confusion medication side effect  Allergies  Codeine; Penicillins; and Sulfa antibiotics  Home Medications   Current Outpatient Rx  Name Route Sig Dispense Refill  . AMIODARONE HCL 200 MG PO TABS Oral Take 200 mg by mouth daily.     [name] AMLODIPINE BESYLATE 2.5 MG PO TABS Oral Take 2.5 mg by mouth daily.    [name] CETIRIZINE HCL 10 MG PO TABS Oral Take 10 mg by mouth daily.    [name] COENZYME Q10 200 MG PO CAPS Oral Take 200 mg by mouth daily.    . DONEPEZIL HCL 10 MG PO TABS Oral Take 10 mg by mouth at bedtime.    [name] EZETIMIBE 10 MG PO TABS Oral Take 10 mg by mouth daily.      [name]  FOLIC ACID 400 MCG PO TABS Oral Take 400 mcg by mouth daily.      [name] GABAPENTIN 100 MG PO CAPS Oral Take 100 mg by mouth 2 (two) times daily.    [name] MIRABEGRON ER 25 MG PO TB24 Oral Take 25 mg by mouth at bedtime.    [name] PINDOLOL 5 MG PO TABS Oral Take 5 mg by mouth 2 (two) times daily.    [name] PRAVASTATIN SODIUM 40 MG PO TABS Oral Take 40 mg by mouth at bedtime.    [name] VITAMIN B-6 100 MG PO TABS Oral Take 100 mg by mouth daily.    [name] SALINE NASAL SPRAY 0.65 % NA SOLN Nasal Place 2 sprays into the nose as needed. For dry nose    . WARFARIN SODIUM 5 MG PO TABS Oral Take 5 mg by mouth daily.    [name] ZOLPIDEM TARTRATE 10 MG PO TABS Oral Take 10 mg by mouth at bedtime as needed. For sleep      BP 152/69  Pulse 56  Temp 97.8 F (36.6 C) (Oral)  Resp 14  SpO2 97%  Physical Exam  Nursing note and vitals reviewed. Constitutional: She appears well-developed and well-nourished.  HENT:  Head: Normocephalic  and atraumatic.  Nose: Nose normal.  Mouth/Throat: Oropharynx is clear and moist.  Eyes: Conjunctivae and EOM are normal. Pupils are equal, round, and reactive to light.  Neck: Normal range of motion. Neck supple. No JVD present. No tracheal deviation present. No thyromegaly present.  Cardiovascular: Normal rate, regular rhythm, normal heart sounds and intact distal pulses.  Exam reveals no gallop and no friction rub.   No murmur heard. Pulmonary/Chest: Effort normal and breath sounds normal. No stridor. No respiratory distress. She has no wheezes. She has no rales. She exhibits no tenderness.       Contusion noted to right mid-upper back.  No crepitus, deformity  Abdominal: Soft. Bowel sounds are normal. She exhibits no distension and no mass. There is no tenderness. There is no rebound and no guarding.  Musculoskeletal: Normal range of motion. She exhibits no edema and no tenderness.  Lymphadenopathy:    She has no cervical adenopathy.  Neurological: She is alert. She has normal reflexes. No cranial nerve deficit. She exhibits normal muscle tone. Coordination normal.       Sleepy and slightly confused, but wakes easily  Skin: Skin is warm and dry. No rash noted. No erythema. No pallor.  Psychiatric: She has a normal mood and affect. Her behavior is normal. Judgment and thought content normal.    ED Course  Procedures (including critical care time)  Labs Reviewed  CBC WITH DIFFERENTIAL - Abnormal; Notable for the following:    Platelets 147 (*)     All other components within normal limits  URINALYSIS, ROUTINE W REFLEX MICROSCOPIC - Abnormal; Notable for the following:    APPearance CLOUDY (*)     Leukocytes, UA SMALL (*)     All other components within normal limits  BASIC METABOLIC PANEL - Abnormal; Notable for the following:    GFR calc non Af Amer 53 (*)     GFR calc Af Amer 62 (*)     All other components within normal limits  PROTIME-INR - Abnormal; Notable for the following:     Prothrombin Time 25.0 (*)     INR 2.22 (*)     All other components within normal limits  URINE MICROSCOPIC-ADD ON - Abnormal; Notable for the following:  Squamous Epithelial / LPF FEW (*)     Bacteria, UA FEW (*)     Casts HYALINE CASTS (*)     All other components within normal limits  GLUCOSE, CAPILLARY   Dg Ribs Unilateral W/chest Right  06/04/2012  *RADIOLOGY REPORT*  Clinical Data: Right lower rib pain post fall  RIGHT RIBS AND CHEST - 3+ VIEW  Comparison: Chest radiograph 09/01/2011  Findings: Minimal enlargement of cardiac silhouette. Calcified tortuous aorta. Plan vascularity normal. Diffuse chronic interstitial lung disease changes and chronic peribronchial thickening. Chronic atelectasis or infiltrate at left lower lobe. No pleural effusion or pneumothorax. Diffuse osseous demineralization. No definite rib fracture or bone destruction.  IMPRESSION: Chronic lung changes most severe at left base. Minimal enlargement of cardiac silhouette. No definite acute right rib abnormalities.   Original Report Authenticated By: Lollie Marrow, M.D.    Dg Thoracic Spine 2 View  06/04/2012  *RADIOLOGY REPORT*  Clinical Data: Upper back pain, fall  THORACIC SPINE - 2 VIEW  Comparison: CT chest 05/17/2011  Findings: 12 pairs of ribs. Osseous demineralization. Minimal disc space narrowing and endplate spur formation greatest at T11-T12. Vertebral body heights maintained without fracture or subluxation. Atherosclerotic calcification noted at aorta. Visualized portions of the posterior ribs are grossly intact.  IMPRESSION: Osseous demineralization with minimal degenerative disc disease changes. No acute abnormalities.   Original Report Authenticated By: Lollie Marrow, M.D.     Date: 06/04/2012  Rate: 51  Rhythm: normal sinus rhythm  QRS Axis: left  Intervals: normal  ST/T Wave abnormalities: nonspecific T wave changes  Conduction Disutrbances:none  Narrative Interpretation:   Old EKG Reviewed:  unchanged     1. Weakness   2. Fall due to stumbling   3. Contusion of chest wall       MDM  78 year old female with what sounds to be mechanical fall, possibly influenced by her nighttime medications. Workup without significant findings. Patient has been able to ambulate with some assistance here in the emergency department. She is waking up well. Plan to discharge to followup with her primary care Dr.        Olivia Mackie, MD 06/04/12 [phone]

## 2012-06-04 NOTE — ED Notes (Signed)
ptar called for transport 

## 2012-06-04 NOTE — ED Notes (Signed)
EMS reports CBG 90.  VS on EMS  Arrival 142/78, 62-18

## 2012-06-04 NOTE — ED Notes (Signed)
Pt reports taking Ambien tonight, unknown time

## 2012-06-04 NOTE — ED Notes (Signed)
Pt arrives by EMS/from [facility].  Pt reports to EMS that she has had weakness and insomnia for the past 3 days.  Tonight, per EMS, she was walking around her apartment house and "got clumsy and fell".  Able to get up by herself.  Now having lower back pain-no LOC.  States a recent med change to [place] from another sleep aid.  States the other sleep med made her hallucinate.  Pt states she has not taken any Ambien.

## 2012-06-11 ENCOUNTER — Ambulatory Visit: Payer: Self-pay | Admitting: Cardiology

## 2012-06-11 DIAGNOSIS — Z7901 Long term (current) use of anticoagulants: Secondary | ICD-10-CM

## 2012-06-11 DIAGNOSIS — I4891 Unspecified atrial fibrillation: Secondary | ICD-10-CM

## 2012-06-23 ENCOUNTER — Other Ambulatory Visit: Payer: Self-pay | Admitting: Pulmonary Disease

## 2012-06-29 ENCOUNTER — Ambulatory Visit (INDEPENDENT_AMBULATORY_CARE_PROVIDER_SITE_OTHER): Payer: Medicare Other | Admitting: Pulmonary Disease

## 2012-06-29 ENCOUNTER — Encounter: Payer: Self-pay | Admitting: Pulmonary Disease

## 2012-06-29 VITALS — BP 116/68 | HR 60 | Temp 97.8°F | Ht 66.5 in | Wt 140.2 lb

## 2012-06-29 DIAGNOSIS — F411 Generalized anxiety disorder: Secondary | ICD-10-CM

## 2012-06-29 DIAGNOSIS — R413 Other amnesia: Secondary | ICD-10-CM

## 2012-06-29 DIAGNOSIS — M1990 Unspecified osteoarthritis, unspecified site: Secondary | ICD-10-CM

## 2012-06-29 DIAGNOSIS — I10 Essential (primary) hypertension: Secondary | ICD-10-CM

## 2012-06-29 DIAGNOSIS — I4891 Unspecified atrial fibrillation: Secondary | ICD-10-CM

## 2012-06-29 DIAGNOSIS — J8410 Pulmonary fibrosis, unspecified: Secondary | ICD-10-CM

## 2012-06-29 DIAGNOSIS — E7800 Pure hypercholesterolemia, unspecified: Secondary | ICD-10-CM

## 2012-06-29 DIAGNOSIS — K219 Gastro-esophageal reflux disease without esophagitis: Secondary | ICD-10-CM

## 2012-06-29 DIAGNOSIS — C649 Malignant neoplasm of unspecified kidney, except renal pelvis: Secondary | ICD-10-CM

## 2012-06-29 NOTE — Patient Instructions (Addendum)
Today we updated your med list in our EPIC system...    Continue your current medications the same...    We added the TRAMADOL to your list- remember to take this w/ food...  We reviewed your recent CXR & everything is stable...  Call for any questions...  Let's continue our 39mo follow up visits.Marland KitchenMarland Kitchen

## 2012-06-29 NOTE — Progress Notes (Signed)
Subjective:    Patient ID: Anne Hayes, female    DOB: Oct 02, 1923, 78 y.o.   MRN: [id number]  HPI 78 y/o WF here for a follow up visit... she has multiple medical problems as noted below... Now seeing Anne Hayes for primary care.  ~  November 15, 2009:  she had f/u right renal lesion by Urology w/ slow growth evident & Bx 4/10 showed Renal Cell Ca- clear cell type... decision was made betw Anne Hayes & Anne Hayes to try radiofreq ablation & this was done on 01/06/09 & 08/18/09... f/u CT 12/10 showed large ablation defect in right kidney- no residual enhancement (?sm nodule RLL to be followed as well)... she remains on Flecanide Rx from Anne Hayes- stable... her CC is intermittent hoarseness & "can't clear my throat"... her exam is neg & we discussed Rx w/ Mucinex, MMW, & ENT eval to get a look at her cords...  ~  May 14, 2011:  4mo ROV & referred by Anne Hayes for f/u of her chronic throat symptoms, cough, & interstitial lung disease on CXR> currently c/o cough x 41yr she says but really c/o clearing her throat a lot & these symptoms go back at least 72yrs w/ eval by ENT 2/11 Anne Hayes> laryngoscopy showed some edema & clinically felt to have LER & Rx w/ Omep 40mg /d but she was not regular w/ it & states it didn't help;  She had some mild basilar interstial changes on CXR back then but sl incr currently (1&1/2 yrs later), also has been on Amiodarone since 11/11 for difficult AFib...    She has had a very difficult time w/ her AFib trated by Anne Hayes & Anne Hayes> she had been tried on Flecainide, Sotalol, etc but either intol or ineffective; finally started on Amiodarone 11/11 & had 2 separate DCCV attempts but it didn't hold;  Finally had EPstudies by Anne Hayes w2/ RF ablation & has been holding NSR since then & the Amio was discontinued...    More recent eval from Anne Hayes for cough, assoc wheezing, hx bronchitis w/ CXR showing basilar ILD & no improvement after several rounds of antibiotics; interesting  that she had run out of her Prevacid rx for GERD...    >> CXR 7/12 from Anne Hayes showed chronic interstitial markings towards the lower lobes & atherosclerosis in Ao...    >> We discussed the need for further eval w/ Collagen-Vasc screen, PFT, CT Chest;  If everything shows as expected she will need a vigorous Antireflux regimen to treat this condition...  ~  July 23, 2011:  46mo ROV & she has mult somatic complaints today> urine has foul odor, no dysuria, rec incr fluids & cranberry juice; c/o concern over her BP (it's 122/66 today), and LBP (chr problem, present for yrs, & has seen Anne Hayes & Anne Hayes), most recently seen by Anne Hayes w/ adjustments & Tylenol for pain;  Also c/o memory prob & her daughter who accompanies her today wants her on meds, rec to start trial Aricept 5mg /d...    Throat symptoms> on MMW & using it 1-2 per day; she states choking episodes have stopped & throat feels better...    Pulm fibrosis> on Pulmicort & using Tussionex prn; states breathing is stable, at baseline, & she is exercising at the gym 2-3 days per week...    Reflux symptoms> on Prilosec20 before dinner, antireflux regimen, & Zantac Qhs; she denies reflux symptoms and states she is not doing these treatments regularly...  ~  December 25, 2011:  33mo ROV & she has mult  rambling complaints> legs burning & recent neuropathy eval by Anne Hayes w/ NCVs that she says indicated "the start of neuropathy"; "I can't sleep" yet she takes Ambien 10mg  nightly & rests fine, tolerates well, but worries about potential side effiects (discussed TylenolPM, Melatonin, other meds and she will discuss w/ Anne Hayes); she had Cards f/u w/ Anne Hayes & Allred "he wanted to change to Xarelto but too$$$"; also c/o intermittent fecal incont- doesn't have GYN & rec to f/u w/ Anne Hayes...    Pulm Fibrosis> only using MMW as needed, not on inhalers (she never took the Pulmicort), Tussionex, etc; she also stopped most of her prev antireflux Rx;  fortunately she is not symptomatic & denies CP, SOB, cough, sputum, etc; CXR/ CTChest/ PFTs/ etc from 8/12 reviewed; last imaging was CXR & CTAbd by Anne Hayes 12/12 w/ stable fibrosis pattern at bases...    HBP/ AFib> on Coumadin, Norvasc5, Cardizem60 prn; followed by Anne Hayes & Anne Hayes- their notes are reviewed...    Chol> on Prav80 & Zetia10 w/ labs followed by Anne Hayes (he is not on EPIC)...    GI> GERD, Hx constip, c/o intermittent fecal incont> we discussed Kaegel exercises 7 she will f/u w/ Anne Hayes...    Anxiety/ Insomnia/ Memory loss> on Aricept5 & Ambien10; she will discuss w/ Anne Hayes in f/u...  ~  June 29, 2012:  63mo ROV & Anne Hayes notes that her breathing is OK, stable DOE w/o change; she had a fall & went to ER w/ XRays- no fx etc;  She had 2 recent ER visits for abd discomfort & weakness, see labs below...     She suffers from chr constip w/ c/o leakage, eval by [name]- sphincter tone was wnl...    She had Colonoscopy 7/13 by Anne Hayes> mod divertics, sm 5mm polyp removed- tubular adenoma. We reviewed prob list, meds, xrays and labs> see below for updates >>  CXR & Right Ribs 9/13>  Borderline heart size, calcif tort Ao, diffuse ILD, basilar atx, osteopenia, no fx... LABS 9/13:  Chems- wnl;  CBC- wnl;  Protime- ok followed in CC;  UA- clear           Problem List:   Hx of COUGH, THROAT CLEARING SYMPTOMS, & BASILAR INTERSTITIAL FIBROSIS >> hx intermittent and recurrent croupy cough & throat symptoms- reflux related> we discussed maximum antireflux regimen w/ PPI Bid, NPO after dinner, elev HOB 6" etc... ~  baseline CXR showed incr markings c/w mild fibrosis... ~  CXR 2/11 showed cardiomeg, & diffuse chr interstitial opac, osteopenia, NAD.[name]. ~  CXR 7/12 from Anne Hayes showed chronic interstitial markings towards the lower lobes & atherosclerosis in Ao... ~  Referred back 8/12 by DrStonking w/ recurrent symptoms likely related to worsening subclinical reflux... ~  CT  Chest 8/12 showed stable emphysematous changes and basilar fibrotic changes; stable scattered pulm nodularlity w/ no suspicious lesions; aortic atheromatous changes; no signif adenopathy; RF ablation changes to right kidney... ~  PFT 8/12 showed FVC=2.23 (77%), FEV1=1.81 (88%), %1sec=81, mid-flows= 148% pred> c/w mild restriction. ~  Labs 8/12 were WNL and neg collagen-vasc screen (CBC/ BMet= wnl, ANA=neg, RF=<10, Sed=25, ACE=37, IgE<1.5)... ~  10/12: supposed to be on Pulmicort 2spBid, MMW, and max antireflux regimen- but it is unclear how much she is actually doing... ~  12/12: she saw Anne Hayes for f/u Renal cell ca> CXR w/ bibasilar fibrosis, CT Abd w/ section thru lung bases- about the same... ~  3/13: she is off the Pulmicort, Prilosec & antireflux measures; she has mult somatic complaints but not  specifically c/o cough, sputum, SOB, etc; we will continue to monitor her progress...  HYPERTENSION (ICD-401.9) - on Amlodipine5mg /d per Anne Hayes... ~  10/12:  She did not bring her med list to the OV today... ~  3/13:  She has seen Anne Hayes & Anne Hayes since her last visit w/ me & EPIC lists Norvasc5 & Cardizem60 "prn" per Anne Hayes but she is unaware... ~  10/13:  BP= 116/68 & she denies CP, palpit, SOB, edema...  ATRIAL FIBRILLATION (ICD-427.31) - long hx of tachy palpit> [facility]; she is followed by Anne Hayes & Anne Hayes for LeB Cards. ~  She was tried on Flecainide & Sotalol but INTOL or ineffective;  Started on Amiodarone 11/11 but 2 unsuccessful cardioversions;  Finally had EP study & RF ablation & she has been holding NSR & off Amiodarone... ~  Anne Hayes tried to switch her from Coumadin to Xarelto but it was too $$$ & she switched back  CEREBROVASCULAR DISEASE (ICD-437.9) - she had a neuro eval 2008 by DrWillis w/ hx of diplopia... ~  MRI showed some atrophy & small vessel disease...  HYPERCHOLESTEROLEMIA (ICD-272.0) - on PRAVASTATIN 80mg /d,  ZETIA 10mg /d,  FISH  OIL 1000mg Bid... Labs followed by Anne Hayes... ~  EPIC EMR labs reviewed, we don't have Anne Hayes's lab results...  GERD (ICD-530.81) - last EGD was 1998 by [facility] showing small gastric ulcer, neg HPylori, Rx PPI.  ~  We reviewed vigorous antireflux regimen for Rx of basilar pulm fibrosis> Prilosec20 before dinner, Zantac at bedtime, elev HOB, don't eat much after dinner.  CONSTIPATION (ICD-564.00) - last colonoscopy 11/07 by Anne Hayes was WNL- no divertics, polyps, etc... constip Rx w/ Miralax/ Senakot OTC... but she claims severe reaction to Miralax(?)...  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 she has urge > stress incont w/ unacceptable side effects from Detrol, Ditropan, Enablex, Veiscare... she had prev bladder tac from DrMcPhail, & Raz Urethropexy from DrTannenbaum... she has a left renal cyst- 4-33mm size, no changes from 2000 & followed by sonar... incidental finding of an 18mm enhancing lesion in right kidney, mid to lower pole- seen on CT Abd 9/08 (concern for small renal cell ca)... plan was for f/u sonar Q23mo to determine the growth rate... ~  S/p radiofreq ablation of right renal cell ca 4/10 by Anne Hayes ~  11/10: pt had percutaneous cryoablation of the right renal cell ca focal reurrence> he continues to follow the pt every 18mo... ~  Most recent f/u scan 12/12 showed stable ablation defect w/o evid of recurrent tumor or regional mets...  DEGENERATIVE JOINT DISEASE (ICD-715.90) - hx DJD and LBP treated by Anne Hayes and DrKritzer in the past...  MEMORY LOSS >> she is c/o memory & daughter requests meds to be started so we discussed Aricept 5mg /d for now...  ANXIETY DISORDER, GENERALIZED (ICD-300.02)   Past Medical History  Diagnosis Date  . Esophageal stricture   . Hypertension   . Atrial fibrillation   . Cerebrovascular disease, unspecified   . Hyperlipidemia   .  DJD (degenerative joint disease)   . Unspecified disorder of kidney and ureter   . Renal cancer   . Osteoarthrosis, unspecified whether generalized or localized, unspecified site   . Generalized anxiety disorder   . GERD (gastroesophageal reflux disease)   . Hiatal hernia   . PN (peripheral neuropathy)     in feet  . Rectal fissure   . Gastric ulcer   .  IBS (irritable bowel syndrome)   . Insomnia   . Adenomatous colon polyp 2013  . Diverticulosis     Past Surgical History  Procedure Date  . Appendectomy   . Cholecystectomy   . Bunionectomies      BILATERAL  . Anterior and posterior vaginal repair 1989    AP REPAIR & RAZ URETHROPEXY  . S/p percutaneous cryoablation of right renal cell cancer 07/2009    by Anne Hayes  . Partial hysterectomy 1977    Outpatient Encounter Prescriptions as of 06/29/2012  Medication Sig Dispense Refill  . amiodarone (PACERONE) 200 MG tablet Take 200 mg by mouth daily.       [name] amLODipine (NORVASC) 2.5 MG tablet Take 2.5 mg by mouth daily.      . cetirizine (ZYRTEC) 10 MG tablet Take 10 mg by mouth daily.      . Coenzyme Q10 200 MG capsule Take 200 mg by mouth daily.      [name] donepezil (ARICEPT) 10 MG tablet Take 10 mg by mouth at bedtime.      [name] ezetimibe (ZETIA) 10 MG tablet Take 10 mg by mouth daily.        . folic acid (FOLVITE) 400 MCG tablet Take 400 mcg by mouth daily.        [name] gabapentin (NEURONTIN) 100 MG capsule Take 100 mg by mouth 2 (two) times daily.      . pravastatin (PRAVACHOL) 40 MG tablet Take 40 mg by mouth at bedtime.      . pyridOXINE (VITAMIN B-6) 100 MG tablet Take 100 mg by mouth daily.      . sodium chloride (OCEAN) 0.65 % nasal spray Place 2 sprays into the nose as needed. For dry nose      . warfarin (COUMADIN) 5 MG tablet TAKE AS DIRECTED  45 tablet  1  . zolpidem (AMBIEN) 10 MG tablet Take 10 mg by mouth at bedtime as needed. For sleep      . mirabegron ER (MYRBETRIQ) 25 MG TB24 Take 25 mg by mouth at bedtime.      .  pindolol (VISKEN) 5 MG tablet Take 5 mg by mouth 2 (two) times daily.      [name] DISCONTD: warfarin (COUMADIN) 5 MG tablet Take 5 mg by mouth daily.        Allergies  Allergen Reactions  . Codeine     REACTION: nausea/vomiting  . Penicillins     REACTION: nausea/vomiting  . Sulfa Antibiotics Itching    Current Medications, Allergies, Past Medical History, Past Surgical History, Family History, and Social History were reviewed in [employer] record.    Review of Systems       See HPI - all other systems neg except as noted...       The patient complains of decreased hearing and dyspnea on exertion.  The patient denies anorexia, fever, weight loss, weight gain, vision loss, hoarseness, chest pain, syncope, peripheral edema, headaches, hemoptysis, abdominal pain, melena, hematochezia, hematuria, incontinence, muscle weakness, suspicious skin lesions, transient blindness, difficulty walking, depression, unusual weight change, abnormal bleeding, enlarged lymph nodes, and angioedema.     Objective:   Physical Exam     WD, WN, 78 y/o WF in NAD... GENERAL:  Alert & oriented; pleasant & cooperative... HEENT:  [place]/AT, EOM-wnl, PERRLA, EACs-clear, TMs-wnl, NOSE-clear, THROAT-clear & wnl, no lesions seen. NECK:  Supple w/ fairROM; no JVD; normal carotid impulses w/o bruits; no thyromegaly or nodules palpated; no lymphadenopathy. CHEST:  Clear  to P & A x for dry velcro rales at bases bilat... HEART:  Regular Rhythm; without murmurs/ rubs/ or gallops heard... ABDOMEN:  Soft & nontender; normal bowel sounds; no organomegaly or masses detected. EXT: without deformities, mod arthritic changes; no varicose veins/ +venous insuffic/ no edema. NEURO:  CN's intact; motor testing normal; sensory testing normal; gait normal & balance OK. DERM:  No lesions noted; no rash etc...  CXR >> 4/12 film showed left basilar scarring & atx, otherw neg...               12/12 film w/ bibasilar  scarring & chr changes...  CT Chest 8/12 >> stable emphysematous changes and basilar fibrotic changes; stable scattered pulm nodularlity w/ no suspicious lesions; aortic atheromatous changes; no signif adenopathy; RF ablation changes to right kidney...               CT Abd 12/12 showed similar chr fibrotic changes at lung bases...  Office Spirometry 8/12 >> FVC= 2.23 (77%), FEV1= 1.81 (88%), %1sec=81, mid-flows= 148%pred >> no signif obstruction, poss mild restriction...  Labs >> WNL and neg collagen-vasc screen (CBC/ BMet= wnl, ANA=neg, RF=<10, Sed=25, ACE=37, IgE<1.5)...   Assessment & Plan:    Chronic Cough, Throat symptoms, etc>  Review of old records here shows long hx throat symptoms, intermittent hoarseness, "can't clear my throat", and cough;  She was prev treated w/ Mucinex, MMW, Antireflux regimen, and ENT eval for her throat & it was all c/w Laryngoesophageal reflux but she never followed up & never stuck w/ a vigorous antireflux regimen...  REC:  PRILOSEC 20mg  Bid taken 30 min before breakfast & dinner;  Don't eat or drink much after dinner to allow stomach to empty completely before bedtime;  Elevate HOB on 6" blocks ==> she reports throat symptoms improved on Rx. ~3/13: she has stopped her antireflux regimen but is now essentially asymptomatic w/o cough, throat symptoms, etc...  Basilar Interstitial Lung Dis>  This could be mild idiopathic pulm fibrosis, but could also be related to reflux & chr nocturnal asp, or poss to Amiodarone Rx;  The Amio has been discontinued;  She is to start a vigorous antireflux regimen & she needs to be vigilant about it;  And her CXR should be followed Q6-12 month to look for signs of deterioration, worsening fibrosis etc...  With her recent airway symptoms> we will start PULMICORT 2 inhalations Bid ==> stable on rx. ~3/13: she never took the Pulmicort; states cough & other symptoms resolved or markedly diminished...   HBP>  On meds per Anne Hayes &  well controlled...  AFIB>  On Rx per Anne Hayes & Anne Hayes & currently improved after RF Ablation & holding NSR (and off Amio); she refused Xarelto due to cost & is back on Coumadin.  CHOL>  On Prav40 n+ Zetia & labs followed by Anne Hayes...  GERD>  She has prev seen DrMedoff & may need f/u GI eval as above...  Renal Cell Ca>  S/p percut cryoablation by Anne Hayes...  MEMORY LOSS>  They would like medication to be started for this problem & given Aricept 5mg /d...  Other medical problems as noted...   Patient's Medications  New Prescriptions   No medications on file  Previous Medications   AMIODARONE (PACERONE) 200 MG TABLET    Take 200 mg by mouth daily.    AMLODIPINE (NORVASC) 2.5 MG TABLET    Take 2.5 mg by mouth daily.   CETIRIZINE (ZYRTEC) 10 MG TABLET    Take 10 mg by  mouth daily.   COENZYME Q10 200 MG CAPSULE    Take 200 mg by mouth daily.   DONEPEZIL (ARICEPT) 10 MG TABLET    Take 10 mg by mouth at bedtime.   EZETIMIBE (ZETIA) 10 MG TABLET    Take 10 mg by mouth daily.     FOLIC ACID (FOLVITE) 400 MCG TABLET    Take 400 mcg by mouth daily.     GABAPENTIN (NEURONTIN) 100 MG CAPSULE    Take 100 mg by mouth 2 (two) times daily.   MIRABEGRON ER (MYRBETRIQ) 25 MG TB24    Take 25 mg by mouth at bedtime.   PINDOLOL (VISKEN) 5 MG TABLET    Take 5 mg by mouth 2 (two) times daily.   PRAVASTATIN (PRAVACHOL) 40 MG TABLET    Take 40 mg by mouth at bedtime.   PYRIDOXINE (VITAMIN B-6) 100 MG TABLET    Take 100 mg by mouth daily.   SODIUM CHLORIDE (OCEAN) 0.65 % NASAL SPRAY    Place 2 sprays into the nose as needed. For dry nose   TRAMADOL (ULTRAM) 50 MG TABLET    Take 50 mg by mouth 3 (three) times daily with meals as needed.   WARFARIN (COUMADIN) 5 MG TABLET    TAKE AS DIRECTED   ZOLPIDEM (AMBIEN) 10 MG TABLET    Take 10 mg by mouth at bedtime as needed. For sleep  Modified Medications   No medications on file  Discontinued Medications   WARFARIN (COUMADIN) 5 MG TABLET    Take 5 mg by  mouth daily.

## 2012-07-09 ENCOUNTER — Ambulatory Visit (INDEPENDENT_AMBULATORY_CARE_PROVIDER_SITE_OTHER): Payer: Medicare Other | Admitting: Pharmacist

## 2012-07-09 DIAGNOSIS — I4891 Unspecified atrial fibrillation: Secondary | ICD-10-CM

## 2012-07-09 DIAGNOSIS — Z7901 Long term (current) use of anticoagulants: Secondary | ICD-10-CM

## 2012-07-09 LAB — POCT INR: INR: 3.8

## 2012-07-14 ENCOUNTER — Other Ambulatory Visit: Payer: Self-pay

## 2012-07-20 ENCOUNTER — Telehealth: Payer: Self-pay | Admitting: Pulmonary Disease

## 2012-07-20 ENCOUNTER — Telehealth: Payer: Self-pay | Admitting: Cardiology

## 2012-07-20 MED ORDER — MAGIC MOUTHWASH: ORAL | Status: DC

## 2012-07-20 MED ORDER — HYDROCOD POLST-CHLORPHEN POLST 10-8 MG/5ML PO LQCR: 5.0000 mL | Freq: Two times a day (BID) | ORAL | Status: DC | PRN

## 2012-07-20 NOTE — Telephone Encounter (Signed)
Spoke with pt. Pt states over the weekend she developed swelling in her ankles. Pt denies increase in SOB, weight gain (pt states weight at home in the 142-144 range today she was 138) ,orthopnea, or overall puffiness. Pt states her ankles tend to swell during the day and seem to be better in the mornings, although she has some swelling in her ankles this morning. Pt requesting I forward this to Dr Johney Frame since Dr Daleen Squibb is out this week. I will forward to Dr Johney Frame for review and recommendations.

## 2012-07-20 NOTE — Telephone Encounter (Signed)
plz return call to patient [phone] regarding ankle swelling over the last 3 days,

## 2012-07-20 NOTE — Telephone Encounter (Signed)
Refills sent. Pt is aware. Jennifer Castillo, CMA  

## 2012-07-20 NOTE — Telephone Encounter (Signed)
I spoke with pt and she c/o sore throat and dry cough x couple weeks off and on. No f/c/s/n/v. She is requesting MMW and tussionex to be called in for her to [facility]. She has called Dr. Vern Claude office regarding swollen feet/ankles. Please advise SN thanks  Allergies  Allergen Reactions  . Codeine     REACTION: nausea/vomiting  . Penicillins     REACTION: nausea/vomiting  . Sulfa Antibiotics Itching

## 2012-07-20 NOTE — Telephone Encounter (Signed)
Per SN--ok to call in MMW  #4 oz  1 tsp gargle and swallow four times daily as needed and tussionex # 4 oz  1 tsp every 12 hours as needed for cough with  5 refills.  thanks

## 2012-07-20 NOTE — Telephone Encounter (Signed)
Thinks she has been eating too much salt She is going to decrease her salt intake and let us know if it does not get any better

## 2012-07-30 ENCOUNTER — Ambulatory Visit (INDEPENDENT_AMBULATORY_CARE_PROVIDER_SITE_OTHER): Payer: Medicare Other | Admitting: Pharmacist

## 2012-07-30 DIAGNOSIS — I4891 Unspecified atrial fibrillation: Secondary | ICD-10-CM

## 2012-07-30 DIAGNOSIS — Z7901 Long term (current) use of anticoagulants: Secondary | ICD-10-CM

## 2012-07-30 LAB — POCT INR: INR: 3.1

## 2012-07-31 ENCOUNTER — Other Ambulatory Visit: Payer: Self-pay | Admitting: Nurse Practitioner

## 2012-07-31 ENCOUNTER — Other Ambulatory Visit: Payer: Self-pay | Admitting: Pulmonary Disease

## 2012-07-31 NOTE — Telephone Encounter (Signed)
Fax Received. Refill Completed. Angelyse Heslin Chowoe (R.M.A)   

## 2012-08-04 ENCOUNTER — Telehealth: Payer: Self-pay | Admitting: Pulmonary Disease

## 2012-08-04 MED ORDER — METHYLPREDNISOLONE 4 MG PO KIT: PACK | ORAL | Status: DC

## 2012-08-04 NOTE — Telephone Encounter (Signed)
Looked at [facility] schedule and all he has available is Thursday at 12:00pm.  Pt aware and that time will not work for her because she is getting her hair done. She is wanting to know if SN will call her something in. She has lots of congestion, a severe dry cough and some chest tightness after she coughs. SN please advise.

## 2012-08-04 NOTE — Telephone Encounter (Signed)
Per SN-patient can have Medrol dose pak and OTC Mucinex 600 mg 2 po bid; pt is aware and will pick up at pharmacy.

## 2012-08-07 ENCOUNTER — Telehealth: Payer: Self-pay | Admitting: Pulmonary Disease

## 2012-08-07 NOTE — Telephone Encounter (Signed)
I spoke with pt and she stated she is still having lots of congestion, dry cough. No wheezing, chest tx SOB. She took the medrol dose pak and not helping. She did not pick up the mucinex as advised from 08/04/12 phone note. Pt is requesting to see SN not TP. Please advise thanks  Last OV 06/29/12 Pending 01/01/13  Allergies  Allergen Reactions  . Codeine     REACTION: nausea/vomiting  . Penicillins     REACTION: nausea/vomiting  . Sulfa Antibiotics Itching

## 2012-08-07 NOTE — Telephone Encounter (Signed)
Per SN---get mucinex 2 po bid with plenty of fluids, call in tussionex 4 oz 1 tsp every 12 hours prn cough and ask her to call next week for appt if not any better. i have attempted to call pt x 2 but no answer--lmtcb x 2.

## 2012-08-10 ENCOUNTER — Telehealth: Payer: Self-pay | Admitting: Pulmonary Disease

## 2012-08-10 NOTE — Telephone Encounter (Signed)
LMOMTCB x 1 

## 2012-08-10 NOTE — Telephone Encounter (Signed)
[email or web address].Jennifer Castillo, CMA  

## 2012-08-11 MED ORDER — HYDROCOD POLST-CHLORPHEN POLST 10-8 MG/5ML PO LQCR: 5.0000 mL | Freq: Two times a day (BID) | ORAL | Status: DC | PRN

## 2012-08-11 NOTE — Telephone Encounter (Signed)
I spoke with pt earlier this am and advised her to call back later this week if not better.  Pt is calling and is wanting appt with SN only.  SN please advise.  Thanks  Allergies  Allergen Reactions  . Codeine     REACTION: nausea/vomiting  . Penicillins     REACTION: nausea/vomiting  . Sulfa Antibiotics Itching

## 2012-08-11 NOTE — Telephone Encounter (Signed)
Per SN---SN has no openings this week--booked solid---can work her in to see TP or we can call in avelox 400 mg  1 po daily  #6 and medrol dosepak  #1  Take as directed.  thanks

## 2012-08-11 NOTE — Telephone Encounter (Signed)
Pt states she already has Tussirex and is taking the Mucinex BID. She is still coughing, mucus is yellow, chest tightness with some wheezing. She denies any increased sob. She does have a sore throat. Pls advise. Allergies  Allergen Reactions  . Codeine     REACTION: nausea/vomiting  . Penicillins     REACTION: nausea/vomiting  . Sulfa Antibiotics Itching

## 2012-08-11 NOTE — Telephone Encounter (Signed)
Called and spoke with pt and she is aware of SN recs and that the tussionex has been called to her pharmacy.  Pt is aware to take the mucinex 2 po bid with plenty of fluids and to call back in a couple of days if not better.  Pt voiced her understanding of this.

## 2012-08-11 NOTE — Telephone Encounter (Signed)
Patient states a nurse called her and told her she was calling in rx for tussionex.  Patient states she already has some of this at home.  Requesting to speak to the nurse.

## 2012-08-11 NOTE — Telephone Encounter (Signed)
ATC patient, no answer LMOMTCB 

## 2012-08-12 ENCOUNTER — Telehealth: Payer: Self-pay | Admitting: Internal Medicine

## 2012-08-12 NOTE — Telephone Encounter (Signed)
lmomtcb x 2  

## 2012-08-12 NOTE — Telephone Encounter (Signed)
Patient wants to be seen for weigh loss and she has constipation that requires her to take Metamucil and Miralax. When she takes, this she has stool incontinence and has to wear a pad. She states if she does not take it, she cannot have BM. Scheduled with Willette Cluster, NP on 08/14/12 at 2:00 PM per her request.

## 2012-08-12 NOTE — Telephone Encounter (Signed)
I agree

## 2012-08-13 NOTE — Telephone Encounter (Signed)
i spoke with pt and is aware of SN recs. She voiced her understanding and needed nothing further 

## 2012-08-13 NOTE — Telephone Encounter (Signed)
Spoke with patient-states she would like rx's called to pharmacy; however her concern is that she is currently on Cipro 500 mg taking 1 po BID x 7 days(5 days left) and wants to know if SN would like to add the Avelox to this or have her finish and just use the Medrol pak. Please advise. Pt stated if she is not at home when we call then its okay to leave a detailed message on her machine.

## 2012-08-13 NOTE — Telephone Encounter (Signed)
ATC pt line busy x 3 wcb 

## 2012-08-13 NOTE — Telephone Encounter (Signed)
Per SN---just finish the cipro and use the medrol dosepak.  thanks

## 2012-08-14 ENCOUNTER — Ambulatory Visit (INDEPENDENT_AMBULATORY_CARE_PROVIDER_SITE_OTHER)
Admission: RE | Admit: 2012-08-14 | Discharge: 2012-08-14 | Disposition: A | Discharge status: Home / Self Care | Payer: Medicare Other | Source: Ambulatory Visit | Attending: Nurse Practitioner | Admitting: Nurse Practitioner

## 2012-08-14 ENCOUNTER — Ambulatory Visit (INDEPENDENT_AMBULATORY_CARE_PROVIDER_SITE_OTHER): Payer: Medicare Other | Admitting: Nurse Practitioner

## 2012-08-14 ENCOUNTER — Encounter: Payer: Self-pay | Admitting: Nurse Practitioner

## 2012-08-14 VITALS — BP 118/60 | HR 80 | Ht 66.0 in | Wt 146.0 lb

## 2012-08-14 DIAGNOSIS — R32 Unspecified urinary incontinence: Secondary | ICD-10-CM

## 2012-08-14 DIAGNOSIS — K5900 Constipation, unspecified: Secondary | ICD-10-CM

## 2012-08-14 NOTE — Progress Notes (Signed)
Awaiting KUB results, if impacted, give Mag citrate, may repeat, then start a bowl regimen of sort.

## 2012-08-14 NOTE — Progress Notes (Signed)
08/14/2012 Anne Hayes [id number] 21-Apr-1924   History of Present Illness:   Patient is an 77 year old female with multiple medical problems, on multiple medications including Coumadin. known to Dr. Juanda Chance for history of chronic constipation. Patient was last seen in July of this year when she presented with lower abdominal pain and constipation. Colonoscopy following that visit was pertinent for moderate sigmoid diverticulosis and a sessile polyp, pathology compatible with tubular adenoma.  Patient presents today with complaints of fecal incontinence. She is having some normal bowel movements but is incontinent of liquid brown stool as well. No abdominal pain. No fever. No nausea vomiting. She is taking MiraLax and fiber supplement.    Current Medications, Allergies, Past Medical History, Past Surgical History, Family History and Social History were reviewed in [employer] record.   Physical Exam: General: Well developed , white female in no acute distress Head: Normocephalic and atraumatic Eyes:  sclerae anicteric, conjunctiva pink  Ears: Normal auditory acuity Lungs: Clear throughout to auscultation Heart: Regular rate and rhythm Abdomen: Soft, non tender and non distended. Fullness in RLQ(?stool filled colon). No masses, no hepatomegaly. Normal bowel sounds Rectal: No impaction. Moderate soft stool in vault. Adequate sphincter tone Musculoskeletal: Symmetrical with no gross deformities  Extremities: No edema  Neurological: Alert oriented x 4, grossly nonfocal Psychological:  Alert and cooperative. Normal mood and affect  Assessment and Recommendations:  fecal incontinence. She has fullness in her right lower quadrant, possibly stool filled colon. Suspect overflow diarrhea. Before purging bowels will confirm this with a KUB. I will call patient with results and further recommendations.

## 2012-08-14 NOTE — Patient Instructions (Addendum)
Your physician has requested that you go to the basement for the followingX-Ray  before leaving today: We will call you with the results.

## 2012-08-18 ENCOUNTER — Ambulatory Visit (INDEPENDENT_AMBULATORY_CARE_PROVIDER_SITE_OTHER): Payer: Medicare Other | Admitting: Internal Medicine

## 2012-08-18 VITALS — BP 124/62 | HR 61 | Temp 97.6°F | Resp 16 | Ht 66.5 in | Wt 143.0 lb

## 2012-08-18 DIAGNOSIS — R1110 Vomiting, unspecified: Secondary | ICD-10-CM

## 2012-08-18 DIAGNOSIS — T50905A Adverse effect of unspecified drugs, medicaments and biological substances, initial encounter: Secondary | ICD-10-CM

## 2012-08-18 DIAGNOSIS — T887XXA Unspecified adverse effect of drug or medicament, initial encounter: Secondary | ICD-10-CM

## 2012-08-18 MED ORDER — PROMETHAZINE HCL 12.5 MG PO TABS: 12.5000 mg | ORAL_TABLET | Freq: Three times a day (TID) | ORAL | Status: DC | PRN

## 2012-08-18 MED ORDER — ONDANSETRON 4 MG PO TBDP
8.0000 mg | ORAL_TABLET | Freq: Once | ORAL | Status: AC
  Administered 2012-08-18: 8 mg via ORAL

## 2012-08-18 NOTE — Patient Instructions (Addendum)
Nausea and Vomiting Nausea is a sick feeling that often comes before throwing up (vomiting). Vomiting is a reflex where stomach contents come out of your mouth. Vomiting can cause severe loss of body fluids (dehydration). Children and elderly adults can become dehydrated quickly, especially if they also have diarrhea. Nausea and vomiting are symptoms of a condition or disease. It is important to find the cause of your symptoms. CAUSES   Direct irritation of the stomach lining. This irritation can result from increased acid production (gastroesophageal reflux disease), infection, food poisoning, taking certain medicines (such as nonsteroidal anti-inflammatory drugs), alcohol use, or tobacco use.  Signals from the brain.These signals could be caused by a headache, heat exposure, an inner ear disturbance, increased pressure in the brain from injury, infection, a tumor, or a concussion, pain, emotional stimulus, or metabolic problems.  An obstruction in the gastrointestinal tract (bowel obstruction).  Illnesses such as diabetes, hepatitis, gallbladder problems, appendicitis, kidney problems, cancer, sepsis, atypical symptoms of a heart attack, or eating disorders.  Medical treatments such as chemotherapy and radiation.  Receiving medicine that makes you sleep (general anesthetic) during surgery. DIAGNOSIS Your caregiver may ask for tests to be done if the problems do not improve after a few days. Tests may also be done if symptoms are severe or if the reason for the nausea and vomiting is not clear. Tests may include:  Urine tests.  Blood tests.  Stool tests.  Cultures (to look for evidence of infection).  X-rays or other imaging studies. Test results can help your caregiver make decisions about treatment or the need for additional tests. TREATMENT You need to stay well hydrated. Drink frequently but in small amounts.You may wish to drink water, sports drinks, clear broth, or eat frozen  ice pops or gelatin dessert to help stay hydrated.When you eat, eating slowly may help prevent nausea.There are also some antinausea medicines that may help prevent nausea. HOME CARE INSTRUCTIONS   Take all medicine as directed by your caregiver.  If you do not have an appetite, do not force yourself to eat. However, you must continue to drink fluids.  If you have an appetite, eat a normal diet unless your caregiver tells you differently.  Eat a variety of complex carbohydrates (rice, wheat, potatoes, bread), lean meats, yogurt, fruits, and vegetables.  Avoid high-fat foods because they are more difficult to digest.  Drink enough water and fluids to keep your urine clear or pale yellow.  If you are dehydrated, ask your caregiver for specific rehydration instructions. Signs of dehydration may include:  Severe thirst.  Dry lips and mouth.  Dizziness.  Dark urine.  Decreasing urine frequency and amount.  Confusion.  Rapid breathing or pulse. SEEK IMMEDIATE MEDICAL CARE IF:   You have blood or brown flecks (like coffee grounds) in your vomit.  You have black or bloody stools.  You have a severe headache or stiff neck.  You are confused.  You have severe abdominal pain.  You have chest pain or trouble breathing.  You do not urinate at least once every 8 hours.  You develop cold or clammy skin.  You continue to vomit for longer than 24 to 48 hours.  You have a fever. MAKE SURE YOU:   Understand these instructions.  Will watch your condition.  Will get help right away if you are not doing well or get worse. Document Released: 09/16/2005 Document Revised: 12/09/2011 Document Reviewed: 02/13/2011 [employer] Patient Information 2013 [employer].  

## 2012-08-18 NOTE — Progress Notes (Signed)
  Subjective:    Patient ID: Anne Hayes, female    DOB: 1924-02-20, 78 y.o.   MRN: [id number]  HPI Vomiting caused by pain pill on empty stomach at [facility] last night. Vomited many times, was able to keep water down and her VS are normal. She has complex medical hx on many meds reviewed in detail today Had skin cancer removed from her nose yesterday. NO cp, dizzy, syncope, bleeding.   Review of Systems Complex rev.    Objective:   Physical Exam  Vitals reviewed. Constitutional: She is oriented to person, place, and time. She appears well-developed and well-nourished. No distress.  HENT:  Mouth/Throat: Oropharynx is clear and moist.  Eyes: EOM are normal. No scleral icterus.  Neck: Neck supple.  Cardiovascular: Normal rate, regular rhythm and normal heart sounds.   Pulmonary/Chest: Effort normal and breath sounds normal.  Abdominal: Soft. There is no tenderness.  Neurological: She is alert and oriented to person, place, and time. She exhibits normal muscle tone. Coordination normal.  Skin: Skin is warm and dry.  Psychiatric: She has a normal mood and affect.  She is nauseas and tired from no sleep  Zofan 8mg  odt now Observed and improved over 45 min.      Assessment & Plan:  DC pain med Phenergan 12.5 mg her request Be sure continue to hydrate

## 2012-08-24 ENCOUNTER — Telehealth: Payer: Self-pay | Admitting: Internal Medicine

## 2012-08-24 ENCOUNTER — Ambulatory Visit (INDEPENDENT_AMBULATORY_CARE_PROVIDER_SITE_OTHER): Payer: Medicare Other | Admitting: *Deleted

## 2012-08-24 DIAGNOSIS — I4891 Unspecified atrial fibrillation: Secondary | ICD-10-CM

## 2012-08-24 DIAGNOSIS — Z7901 Long term (current) use of anticoagulants: Secondary | ICD-10-CM

## 2012-08-24 LAB — POCT INR: INR: 3.8

## 2012-08-24 NOTE — Telephone Encounter (Deleted)
New Problem:    Patient called in wanting to speak with someone about her coumadin.  Please call back.

## 2012-09-03 ENCOUNTER — Other Ambulatory Visit: Payer: Self-pay | Admitting: Interventional Radiology

## 2012-09-03 DIAGNOSIS — N2889 Other specified disorders of kidney and ureter: Secondary | ICD-10-CM

## 2012-09-09 ENCOUNTER — Encounter: Payer: Self-pay | Admitting: Internal Medicine

## 2012-09-09 ENCOUNTER — Ambulatory Visit (INDEPENDENT_AMBULATORY_CARE_PROVIDER_SITE_OTHER): Payer: Medicare Other | Admitting: *Deleted

## 2012-09-09 ENCOUNTER — Ambulatory Visit (INDEPENDENT_AMBULATORY_CARE_PROVIDER_SITE_OTHER): Payer: Medicare Other | Admitting: Internal Medicine

## 2012-09-09 VITALS — BP 122/58 | HR 48 | Resp 18 | Ht 66.0 in | Wt 144.8 lb

## 2012-09-09 DIAGNOSIS — Z7901 Long term (current) use of anticoagulants: Secondary | ICD-10-CM

## 2012-09-09 DIAGNOSIS — I4891 Unspecified atrial fibrillation: Secondary | ICD-10-CM

## 2012-09-09 LAB — HEPATIC FUNCTION PANEL
ALT: 28 U/L (ref 0–35)
Total Bilirubin: 0.4 mg/dL (ref 0.3–1.2)

## 2012-09-09 LAB — TSH: TSH: 3.71 u[IU]/mL (ref 0.35–5.50)

## 2012-09-09 LAB — BASIC METABOLIC PANEL
Calcium: 8.5 mg/dL (ref 8.4–10.5)
Creatinine, Ser: 1 mg/dL (ref 0.4–1.2)

## 2012-09-09 LAB — POCT INR: INR: 2.2

## 2012-09-09 LAB — T4, FREE: Free T4: 0.96 ng/dL (ref 0.60–1.60)

## 2012-09-09 MED ORDER — AMIODARONE HCL 200 MG PO TABS: 100.0000 mg | ORAL_TABLET | Freq: Every day | ORAL | Status: DC

## 2012-09-09 NOTE — Assessment & Plan Note (Signed)
Maintaining sinus rhythm Decrease amiodarone to 100mg  daily  No other changes today

## 2012-09-09 NOTE — Progress Notes (Signed)
PCP: Ginette Otto, MD Primary Cardiologist:  Dr Donetta Potts is a 78 y.o. female who presents today for routine electrophysiology followup.  Since last being seen in our clinic, the patient reports doing very well.  She has had no further afib.  She continues to have unsteadiness with ambulation.  She denies falls.  Today, she denies symptoms of palpitations, chest pain, shortness of breath,  lower extremity edema, dizziness, presyncope, or syncope.  She is tolerating amiodarone.  The patient is otherwise without complaint today.   Past Medical History  Diagnosis Date  . Esophageal stricture   . Hypertension   . Atrial fibrillation   . Cerebrovascular disease, unspecified   . Hyperlipidemia   . DJD (degenerative joint disease)   . Unspecified disorder of kidney and ureter   . Renal cancer   . Osteoarthrosis, unspecified whether generalized or localized, unspecified site   . Generalized anxiety disorder   . GERD (gastroesophageal reflux disease)   . Hiatal hernia   . PN (peripheral neuropathy)     in feet  . Rectal fissure   . Gastric ulcer   . IBS (irritable bowel syndrome)   . Insomnia   . Adenomatous colon polyp 2013  . Diverticulosis   . Skin cancer of nose    Past Surgical History  Procedure Date  . Appendectomy   . Cholecystectomy   . Bunionectomies      BILATERAL  . Anterior and posterior vaginal repair 1989    AP REPAIR & RAZ URETHROPEXY  . S/p percutaneous cryoablation of right renal cell cancer 07/2009    by [name]  . Partial hysterectomy 1977    Current Outpatient Prescriptions  Medication Sig Dispense Refill  . Alum & Mag Hydroxide-Simeth (MAGIC MOUTHWASH) SOLN Take 1 teaspoon gargle and swallow four times a day as needed  120 mL  0  . amiodarone (PACERONE) 200 MG tablet Take 0.5 tablets (100 mg total) by mouth daily.      [name] amLODipine (NORVASC) 2.5 MG tablet Take 2.5 mg by mouth daily.      . chlorpheniramine-HYDROcodone (TUSSIONEX  PENNKINETIC ER) 10-8 MG/5ML LQCR Take 5 mLs by mouth every 12 (twelve) hours as needed.  120 mL  1  . Coenzyme Q10 200 MG capsule Take 200 mg by mouth daily.      [name] diltiazem (CARDIZEM CD) 300 MG 24 hr capsule TAKE 1 CAPSULE BY MOUTH DAILY.  90 capsule  1  . donepezil (ARICEPT) 10 MG tablet Take 10 mg by mouth at bedtime.      [name] ezetimibe (ZETIA) 10 MG tablet Take 10 mg by mouth daily.        . folic acid (FOLVITE) 400 MCG tablet Take 400 mcg by mouth daily.        [name] gabapentin (NEURONTIN) 100 MG capsule Take 100 mg by mouth 2 (two) times daily.      . methylPREDNISolone (MEDROL, PAK,) 4 MG tablet follow package directions  21 tablet  0  . mirabegron ER (MYRBETRIQ) 25 MG TB24 Take 25 mg by mouth at bedtime.      . pravastatin (PRAVACHOL) 40 MG tablet Take 40 mg by mouth at bedtime.      . promethazine (PHENERGAN) 12.5 MG tablet Take 1 tablet (12.5 mg total) by mouth every 8 (eight) hours as needed for nausea.  20 tablet  0  . pyridOXINE (VITAMIN B-6) 100 MG tablet Take 100 mg by mouth daily.      [name]  sodium chloride (OCEAN) 0.65 % nasal spray Place 2 sprays into the nose as needed. For dry nose      . traMADol (ULTRAM) 50 MG tablet Take 50 mg by mouth 3 (three) times daily with meals as needed.      . warfarin (COUMADIN) 5 MG tablet TAKE AS DIRECTED  45 tablet  1  . zolpidem (AMBIEN) 10 MG tablet Take 10 mg by mouth at bedtime as needed. For sleep      . [DISCONTINUED] amiodarone (PACERONE) 200 MG tablet Take 200 mg by mouth daily.       . cetirizine (ZYRTEC) 10 MG tablet Take 10 mg by mouth daily.        Physical Exam: Filed Vitals:   09/09/12 1156  BP: 122/58  Pulse: 48  Resp: 18  Height: 5\' 6"  (1.676 m)  Weight: 144 lb 12.8 oz (65.681 kg)    GEN- The patient is well appearing, alert and oriented x 3 today.   Head- normocephalic, atraumatic Eyes-  Sclera clear, conjunctiva pink Ears- hearing intact Oropharynx- clear Lungs- Clear to ausculation bilaterally, normal work of  breathing Heart- Regular rate and rhythm, no murmurs, rubs or gallops, PMI not laterally displaced GI- soft, NT, ND, + BS Extremities- no clubbing, cyanosis, or edema  ekg today reveals sinus bradycardia 52 bpm, PR 210, Qtc 427, otherwise normal ekg  Assessment and Plan:

## 2012-09-09 NOTE — Patient Instructions (Signed)
Your physician wants you to follow-up in: 6 months with Dr Jacquiline Doe will receive a reminder letter in the mail two months in advance. If you don't receive a letter, please call our office to schedule the follow-up appointment. Your physician recommends that you return for lab work drawn today ( BMP, Liver, TSH, T4)  Your physician has recommended you make the following change in your medication: DECREASE Amiodarone to 100 mg daily

## 2012-09-10 ENCOUNTER — Ambulatory Visit
Admission: RE | Admit: 2012-09-10 | Discharge: 2012-09-10 | Disposition: A | Discharge status: Home / Self Care | Payer: Medicare Other | Source: Ambulatory Visit | Attending: Interventional Radiology | Admitting: Interventional Radiology

## 2012-09-10 ENCOUNTER — Ambulatory Visit (HOSPITAL_COMMUNITY)
Admission: RE | Admit: 2012-09-10 | Discharge: 2012-09-10 | Disposition: A | Discharge status: Home / Self Care | Payer: Medicare Other | Source: Ambulatory Visit | Attending: Interventional Radiology | Admitting: Interventional Radiology

## 2012-09-10 DIAGNOSIS — N2889 Other specified disorders of kidney and ureter: Secondary | ICD-10-CM

## 2012-09-10 DIAGNOSIS — Z85528 Personal history of other malignant neoplasm of kidney: Secondary | ICD-10-CM | POA: Insufficient documentation

## 2012-09-10 DIAGNOSIS — K838 Other specified diseases of biliary tract: Secondary | ICD-10-CM | POA: Insufficient documentation

## 2012-09-10 DIAGNOSIS — Z09 Encounter for follow-up examination after completed treatment for conditions other than malignant neoplasm: Secondary | ICD-10-CM | POA: Insufficient documentation

## 2012-09-10 MED ORDER — IOHEXOL 300 MG/ML  SOLN
100.0000 mL | Freq: Once | INTRAMUSCULAR | Status: AC | PRN
  Administered 2012-09-10: 100 mL via INTRAVENOUS

## 2012-09-16 NOTE — Progress Notes (Signed)
Patient ID: Anne Hayes, female   DOB: 24-Oct-1923, 78 y.o.   MRN: [id number]   ESTABLISHED PATIENT OFFICE VISIT  Chief Complaint: Status post radiofrequency ablation of a right renal cell carcinoma on 01/06/2009 and retreatment of tumor recurrence on 08/18/2009 with percutaneous cryoablation.  History: The patient has been doing well and denies any urinary symptoms or abdominal pain. She remains on chronic Coumadin for paroxysmal atrial fibrillation.  Review of Systems: No fever, hematuria or dysuria. No flank pain.  Exam: Vital signs: Blood pressure 179/81, pulse 58, respirations 16, temperature 97, oxygen saturation 94% on room air. General : No distress. Abdomen: Soft and nontender. No flank tenderness.  Labs: INR 2.2, BUN 16, creatinine 1.0, estimated GFR 57 ml/minute. Total bilirubin 0.4, AST 32, ALT 28, alkaline phosphatase 52.  Imaging: Follow-up CT performed earlier today demonstrates stable ablation changes involving the right kidney with no evidence of recurrent enhancing tumor. There was suggestion of some potential progression of biliary dilatation by CT post cholecystectomy.  Assessment and Plan: Anne Hayes is doing well and is now 3 years status post reablation of a right renal carcinoma with no evidence of recurrence. Given her current age, I have recommended that we follow the ablation site for only one more year if she is doing well at this time next year. The patient is agreeable. Given current appearance by CT, it is very unlikely that the tumor will recur. The patient is asymptomatic with respect to the suggestion of biliary dilatation by CT and liver function tests are completely normal.

## 2012-09-18 ENCOUNTER — Other Ambulatory Visit: Payer: Self-pay | Admitting: Pulmonary Disease

## 2012-09-21 ENCOUNTER — Ambulatory Visit (INDEPENDENT_AMBULATORY_CARE_PROVIDER_SITE_OTHER): Payer: Medicare Other | Admitting: Emergency Medicine

## 2012-09-21 VITALS — BP 120/63 | HR 56 | Temp 97.3°F | Resp 16 | Ht 65.0 in | Wt 142.0 lb

## 2012-09-21 DIAGNOSIS — J209 Acute bronchitis, unspecified: Secondary | ICD-10-CM

## 2012-09-21 DIAGNOSIS — J40 Bronchitis, not specified as acute or chronic: Secondary | ICD-10-CM

## 2012-09-21 MED ORDER — AZITHROMYCIN 250 MG PO TABS: ORAL_TABLET | ORAL | Status: DC

## 2012-09-21 NOTE — Progress Notes (Signed)
[facility] [address], [place] [address] [phone]  Date:  09/21/2012   Name:  Anne Hayes   DOB:  [birth year]   MRN:  [id number]  PCP:  Ginette Otto, MD    Chief Complaint: Cough   History of Present Illness:  Anne Hayes is a 78 y.o. very pleasant female patient who presents with the following:  Ill since last night with cough.  Not productive.  No wheezing or shortness of breath. No fever or chills.  Nasal congestion no drainage or sore throat.  Hoarse.  No improvement with OTC medication.  Says that she was up all night long with cough.  No sick contacts  Using tussionex without much success  Patient Active Problem List  Diagnosis  . RENAL CELL CANCER  . HYPERCHOLESTEROLEMIA  . ANXIETY DISORDER, GENERALIZED  . HYPERTENSION  . Atrial fibrillation  . SINUS BRADYCARDIA  . CEREBROVASCULAR DISEASE  . GERD  . CONSTIPATION  . UNSPECIFIED DISORDER OF KIDNEY AND URETER  . DEGENERATIVE JOINT DISEASE  . HOARSENESS  . COUGH  . Pulmonary interstitial fibrosis  . Encounter for long-term (current) use of anticoagulants  . Memory loss  . Closed fracture of 5th metacarpal  . Incontinence    Past Medical History  Diagnosis Date  . Esophageal stricture   . Hypertension   . Atrial fibrillation   . Cerebrovascular disease, unspecified   . Hyperlipidemia   . DJD (degenerative joint disease)   . Unspecified disorder of kidney and ureter   . Renal cancer   . Osteoarthrosis, unspecified whether generalized or localized, unspecified site   . Generalized anxiety disorder   . GERD (gastroesophageal reflux disease)   . Hiatal hernia   . PN (peripheral neuropathy)     in feet  . Rectal fissure   . Gastric ulcer   . IBS (irritable bowel syndrome)   . Insomnia   . Adenomatous colon polyp 2013  . Diverticulosis   . Skin cancer of nose     Past Surgical History  Procedure Date  . Appendectomy   . Cholecystectomy   . Bunionectomies       BILATERAL  . Anterior and posterior vaginal repair 1989    AP REPAIR & RAZ URETHROPEXY  . S/p percutaneous cryoablation of right renal cell cancer 07/2009    by [name]  . Partial hysterectomy 1977    History  Substance Use Topics  . Smoking status: Former Smoker -- 1.0 packs/day for 50 years    Types: Cigarettes    Quit date: 09/30/1988  . Smokeless tobacco: Never Used  . Alcohol Use: 1.2 oz/week    2 Glasses of wine per week     Comment: glass of wine with dinner    Family History  Problem Relation Age of Onset  . Heart disease Father   . Colon cancer Neg Hx   . Stroke Mother     Allergies  Allergen Reactions  . Codeine Nausea And Vomiting       . Penicillins Nausea And Vomiting  . Sulfa Antibiotics Itching    Medication list has been reviewed and updated.  Current Outpatient Prescriptions on File Prior to Visit  Medication Sig Dispense Refill  . Alum & Mag Hydroxide-Simeth (MAGIC MOUTHWASH) SOLN Take 1 teaspoon gargle and swallow four times a day as needed  120 mL  0  . amiodarone (PACERONE) 200 MG tablet Take 0.5 tablets (100 mg total) by mouth daily.      [name]  amLODipine (NORVASC) 2.5 MG tablet Take 2.5 mg by mouth daily.      . cetirizine (ZYRTEC) 10 MG tablet Take 10 mg by mouth daily.      . chlorpheniramine-HYDROcodone (TUSSIONEX PENNKINETIC ER) 10-8 MG/5ML LQCR Take 5 mLs by mouth every 12 (twelve) hours as needed.  120 mL  1  . Coenzyme Q10 200 MG capsule Take 200 mg by mouth daily.      [name] diltiazem (CARDIZEM CD) 300 MG 24 hr capsule TAKE 1 CAPSULE BY MOUTH DAILY.  90 capsule  1  . donepezil (ARICEPT) 10 MG tablet Take 10 mg by mouth at bedtime.      [name] ezetimibe (ZETIA) 10 MG tablet Take 10 mg by mouth daily.        . folic acid (FOLVITE) 400 MCG tablet Take 400 mcg by mouth daily.        [name] gabapentin (NEURONTIN) 100 MG capsule Take 100 mg by mouth 2 (two) times daily.      . methylPREDNISolone (MEDROL, PAK,) 4 MG tablet follow package directions  21  tablet  0  . mirabegron ER (MYRBETRIQ) 25 MG TB24 Take 25 mg by mouth at bedtime.      . pravastatin (PRAVACHOL) 40 MG tablet Take 40 mg by mouth at bedtime.      . promethazine (PHENERGAN) 12.5 MG tablet Take 1 tablet (12.5 mg total) by mouth every 8 (eight) hours as needed for nausea.  20 tablet  0  . pyridOXINE (VITAMIN B-6) 100 MG tablet Take 100 mg by mouth daily.      . sodium chloride (OCEAN) 0.65 % nasal spray Place 2 sprays into the nose as needed. For dry nose      . traMADol (ULTRAM) 50 MG tablet Take 50 mg by mouth 3 (three) times daily with meals as needed.      . warfarin (COUMADIN) 5 MG tablet TAKE AS DIRECTED  45 tablet  1  . zolpidem (AMBIEN) 10 MG tablet Take 10 mg by mouth at bedtime as needed. For sleep        Review of Systems:  As per HPI, otherwise negative.    Physical Examination: Filed Vitals:   09/21/12 1310  BP: 120/63  Pulse: 56  Temp: 97.3 F (36.3 C)  Resp: 16   Filed Vitals:   09/21/12 1310  Height: 5\' 5"  (1.651 m)  Weight: 142 lb (64.411 kg)   Body mass index is 23.63 kg/(m^2). Ideal Body Weight: Weight in (lb) to have BMI = 25: 149.9   GEN: WDWN, NAD, Non-toxic, A & O x 3 HEENT: Atraumatic, Normocephalic. Neck supple. No masses, No LAD.  Hoarse.  Oropharynx negative Ears and Nose: No external deformity.  TM negative CV: RRR, No M/G/R. No JVD. No thrill. No extra heart sounds. PULM: CTA B, no wheezes, crackles, rhonchi. No retractions. No resp. distress. No accessory muscle use. ABD: S, NT, ND, +BS. No rebound. No HSM. EXTR: No c/c/e NEURO Normal gait.  PSYCH: Normally interactive. Conversant. Not depressed or anxious appearing.  Calm demeanor.    Assessment and Plan: Bronchitis zpak tussionex  Carmelina Dane, MD

## 2012-09-21 NOTE — Patient Instructions (Signed)

## 2012-10-05 ENCOUNTER — Ambulatory Visit (INDEPENDENT_AMBULATORY_CARE_PROVIDER_SITE_OTHER): Payer: Medicare Other | Admitting: *Deleted

## 2012-10-05 DIAGNOSIS — Z7901 Long term (current) use of anticoagulants: Secondary | ICD-10-CM

## 2012-10-05 DIAGNOSIS — I4891 Unspecified atrial fibrillation: Secondary | ICD-10-CM

## 2012-10-05 LAB — POCT INR: INR: 3

## 2012-10-23 ENCOUNTER — Ambulatory Visit (INDEPENDENT_AMBULATORY_CARE_PROVIDER_SITE_OTHER): Payer: Medicare Other

## 2012-10-23 DIAGNOSIS — I4891 Unspecified atrial fibrillation: Secondary | ICD-10-CM

## 2012-10-23 DIAGNOSIS — Z7901 Long term (current) use of anticoagulants: Secondary | ICD-10-CM

## 2012-11-03 ENCOUNTER — Emergency Department (HOSPITAL_COMMUNITY): Payer: Medicare Other

## 2012-11-03 ENCOUNTER — Emergency Department (HOSPITAL_COMMUNITY)
Admission: EM | Admit: 2012-11-03 | Discharge: 2012-11-03 | Disposition: A | Discharge status: Home / Self Care | Payer: Medicare Other | Attending: Emergency Medicine | Admitting: Emergency Medicine

## 2012-11-03 ENCOUNTER — Encounter (HOSPITAL_COMMUNITY): Payer: Self-pay

## 2012-11-03 DIAGNOSIS — Y92009 Unspecified place in unspecified non-institutional (private) residence as the place of occurrence of the external cause: Secondary | ICD-10-CM | POA: Insufficient documentation

## 2012-11-03 DIAGNOSIS — I10 Essential (primary) hypertension: Secondary | ICD-10-CM | POA: Insufficient documentation

## 2012-11-03 DIAGNOSIS — Z85828 Personal history of other malignant neoplasm of skin: Secondary | ICD-10-CM | POA: Insufficient documentation

## 2012-11-03 DIAGNOSIS — N289 Disorder of kidney and ureter, unspecified: Secondary | ICD-10-CM | POA: Insufficient documentation

## 2012-11-03 DIAGNOSIS — S0990XA Unspecified injury of head, initial encounter: Secondary | ICD-10-CM

## 2012-11-03 DIAGNOSIS — Z87891 Personal history of nicotine dependence: Secondary | ICD-10-CM | POA: Insufficient documentation

## 2012-11-03 DIAGNOSIS — Z8553 Personal history of malignant neoplasm of renal pelvis: Secondary | ICD-10-CM | POA: Insufficient documentation

## 2012-11-03 DIAGNOSIS — G609 Hereditary and idiopathic neuropathy, unspecified: Secondary | ICD-10-CM | POA: Insufficient documentation

## 2012-11-03 DIAGNOSIS — I4891 Unspecified atrial fibrillation: Secondary | ICD-10-CM | POA: Insufficient documentation

## 2012-11-03 DIAGNOSIS — G4700 Insomnia, unspecified: Secondary | ICD-10-CM | POA: Insufficient documentation

## 2012-11-03 DIAGNOSIS — M542 Cervicalgia: Secondary | ICD-10-CM

## 2012-11-03 DIAGNOSIS — R51 Headache: Secondary | ICD-10-CM | POA: Insufficient documentation

## 2012-11-03 DIAGNOSIS — S0083XA Contusion of other part of head, initial encounter: Secondary | ICD-10-CM

## 2012-11-03 DIAGNOSIS — Z8673 Personal history of transient ischemic attack (TIA), and cerebral infarction without residual deficits: Secondary | ICD-10-CM | POA: Insufficient documentation

## 2012-11-03 DIAGNOSIS — R404 Transient alteration of awareness: Secondary | ICD-10-CM | POA: Insufficient documentation

## 2012-11-03 DIAGNOSIS — W1789XA Other fall from one level to another, initial encounter: Secondary | ICD-10-CM | POA: Insufficient documentation

## 2012-11-03 DIAGNOSIS — M479 Spondylosis, unspecified: Secondary | ICD-10-CM | POA: Insufficient documentation

## 2012-11-03 DIAGNOSIS — Z8719 Personal history of other diseases of the digestive system: Secondary | ICD-10-CM | POA: Insufficient documentation

## 2012-11-03 DIAGNOSIS — Z79899 Other long term (current) drug therapy: Secondary | ICD-10-CM | POA: Insufficient documentation

## 2012-11-03 DIAGNOSIS — W19XXXA Unspecified fall, initial encounter: Secondary | ICD-10-CM

## 2012-11-03 DIAGNOSIS — E785 Hyperlipidemia, unspecified: Secondary | ICD-10-CM | POA: Insufficient documentation

## 2012-11-03 DIAGNOSIS — F411 Generalized anxiety disorder: Secondary | ICD-10-CM | POA: Insufficient documentation

## 2012-11-03 DIAGNOSIS — Z7901 Long term (current) use of anticoagulants: Secondary | ICD-10-CM | POA: Insufficient documentation

## 2012-11-03 DIAGNOSIS — Y93E5 Activity, floor mopping and cleaning: Secondary | ICD-10-CM | POA: Insufficient documentation

## 2012-11-03 DIAGNOSIS — S79919A Unspecified injury of unspecified hip, initial encounter: Secondary | ICD-10-CM | POA: Insufficient documentation

## 2012-11-03 DIAGNOSIS — M1990 Unspecified osteoarthritis, unspecified site: Secondary | ICD-10-CM | POA: Insufficient documentation

## 2012-11-03 LAB — BASIC METABOLIC PANEL
BUN: 18 mg/dL (ref 6–23)
GFR calc Af Amer: 86 mL/min — ABNORMAL LOW (ref >90)
GFR calc non Af Amer: 75 mL/min — ABNORMAL LOW (ref >90)
Potassium: 3.9 mEq/L (ref 3.5–5.1)
Sodium: 140 mEq/L (ref 135–145)

## 2012-11-03 LAB — PROTIME-INR: Prothrombin Time: 21.9 seconds — ABNORMAL HIGH (ref 11.6–15.2)

## 2012-11-03 LAB — CBC WITH DIFFERENTIAL/PLATELET
Eosinophils Absolute: 0.1 10*3/uL (ref 0.0–0.7)
Eosinophils Relative: 1 % (ref 0–5)
HCT: 43.9 % (ref 36.0–46.0)
Lymphs Abs: 1.1 10*3/uL (ref 0.7–4.0)
MCH: 30.8 pg (ref 26.0–34.0)
MCV: 94 fL (ref 78.0–100.0)
Monocytes Absolute: 0.9 10*3/uL (ref 0.1–1.0)
Platelets: 149 10*3/uL — ABNORMAL LOW (ref 150–400)
RBC: 4.67 MIL/uL (ref 3.87–5.11)
RDW: 13.5 % (ref 11.5–15.5)

## 2012-11-03 MED ORDER — FENTANYL CITRATE 0.05 MG/ML IJ SOLN
50.0000 ug | Freq: Once | INTRAMUSCULAR | Status: AC
  Administered 2012-11-03: 50 ug via INTRAVENOUS
  Filled 2012-11-03: qty 2

## 2012-11-03 MED ORDER — ACETAMINOPHEN 325 MG PO TABS
650.0000 mg | ORAL_TABLET | Freq: Once | ORAL | Status: AC
  Administered 2012-11-03: 650 mg via ORAL
  Filled 2012-11-03: qty 2

## 2012-11-03 NOTE — ED Provider Notes (Signed)
History     CSN: [id number]  Arrival date & time 11/03/12  0809   First MD Initiated Contact with Patient 11/03/12 [phone]      Chief Complaint  Patient presents with  . Fall    (Consider location/radiation/quality/duration/timing/severity/associated sxs/prior treatment) HPI Comments: 79 y/o F h/o A. Fib on coumadin, CVA, HTN presents s/p mechanical fall at home. Cleaning shower when she lost balance and fell. +amnesia. +LOC. Complains of some neck discomfort, primarily from cervical collar. Ambulated after fall to living room to call EMS.  Patient is a 79 y.o. female presenting with fall.  Fall The accident occurred 1 to 2 hours ago. Incident: while standing cleaning shower. She landed on a hard floor. The volume of blood lost was minimal (dry blood to face. uncertain source). Point of impact: uncertain. Pain location: neck and mild pelvic pain. The pain is mild. She was ambulatory at the scene. Associated symptoms include headaches and loss of consciousness. Pertinent negatives include no visual change, no fever, no numbness, no abdominal pain, no nausea and no vomiting. Treatment on scene includes a c-collar.    Past Medical History  Diagnosis Date  . Esophageal stricture   . Hypertension   . Atrial fibrillation   . Cerebrovascular disease, unspecified   . Hyperlipidemia   . DJD (degenerative joint disease)   . Unspecified disorder of kidney and ureter   . Renal cancer   . Osteoarthrosis, unspecified whether generalized or localized, unspecified site   . Generalized anxiety disorder   . GERD (gastroesophageal reflux disease)   . Hiatal hernia   . PN (peripheral neuropathy)     in feet  . Rectal fissure   . Gastric ulcer   . IBS (irritable bowel syndrome)   . Insomnia   . Adenomatous colon polyp 2013  . Diverticulosis   . Skin cancer of nose     Past Surgical History  Procedure Date  . Appendectomy   . Cholecystectomy   . Bunionectomies      BILATERAL  . Anterior  and posterior vaginal repair 1989    AP REPAIR & RAZ URETHROPEXY  . S/p percutaneous cryoablation of right renal cell cancer 07/2009    by [name]  . Partial hysterectomy 1977    Family History  Problem Relation Age of Onset  . Heart disease Father   . Colon cancer Neg Hx   . Stroke Mother     History  Substance Use Topics  . Smoking status: Former Smoker -- 1.0 packs/day for 50 years    Types: Cigarettes    Quit date: 09/30/1988  . Smokeless tobacco: Never Used  . Alcohol Use: 1.2 oz/week    2 Glasses of wine per week     Comment: glass of wine with dinner    OB History    Grav Para Term Preterm Abortions TAB SAB Ect Mult Living                  Review of Systems  Constitutional: Negative for fever and chills.  HENT: Positive for neck pain. Negative for neck stiffness.   Eyes: Negative for visual disturbance.  Respiratory: Negative for cough and shortness of breath.   Cardiovascular: Negative for chest pain.  Gastrointestinal: Negative for nausea, vomiting, abdominal pain and diarrhea.  Genitourinary: Negative for flank pain and difficulty urinating.  Musculoskeletal: Positive for back pain (mild upper back pain).  Skin: Negative for color change and rash.  Neurological: Positive for loss of consciousness and headaches.  Negative for dizziness and numbness.  All other systems reviewed and are negative.    Allergies  Codeine; Penicillins; and Sulfa antibiotics  Home Medications   Current Outpatient Rx  Name  Route  Sig  Dispense  Refill  . AMIODARONE HCL 200 MG PO TABS   Oral   Take 200 mg by mouth daily.         [name] AMLODIPINE BESYLATE 2.5 MG PO TABS   Oral   Take 2.5 mg by mouth daily.         [name] CETIRIZINE HCL 10 MG PO TABS   Oral   Take 10 mg by mouth daily.         . CO Q-10 100 MG PO CAPS   Oral   Take 100 mg by mouth daily.         . DONEPEZIL HCL 10 MG PO TABS   Oral   Take 10 mg by mouth at bedtime.         [name] EZETIMIBE 10 MG  PO TABS   Oral   Take 10 mg by mouth daily.           [name] FOLIC ACID 400 MCG PO TABS   Oral   Take 400 mcg by mouth daily.           [name] GABAPENTIN 100 MG PO CAPS   Oral   Take 100 mg by mouth 2 (two) times daily.         [name] MIRABEGRON ER 25 MG PO TB24   Oral   Take 25 mg by mouth at bedtime.         [name] MIRTAZAPINE 15 MG PO TABS   Oral   Take 7.5 mg by mouth daily.         [name] PRAVASTATIN SODIUM 40 MG PO TABS   Oral   Take 40 mg by mouth at bedtime.         [name] VITAMIN B-6 100 MG PO TABS   Oral   Take 100 mg by mouth daily.         . SERTRALINE HCL 25 MG PO TABS   Oral   Take 25 mg by mouth daily.         [name] SALINE NASAL SPRAY 0.65 % NA SOLN   Nasal   Place 2 sprays into the nose as needed. For dry nose         . TRAMADOL HCL 50 MG PO TABS   Oral   Take 50 mg by mouth every 6 (six) hours as needed. For pain         . WARFARIN SODIUM 5 MG PO TABS   Oral   Take 2.5-5 mg by mouth daily. 2.5 mg Monday and Friday and 5 mg all other days         . ZOLPIDEM TARTRATE 10 MG PO TABS   Oral   Take 10 mg by mouth at bedtime as needed. For sleep           BP 153/86  Pulse 67  Temp 97.3 F (36.3 C) (Oral)  Resp 15  SpO2 96%  Physical Exam  Nursing note and vitals reviewed. Constitutional: She is oriented to person, place, and time. She appears well-developed and well-nourished. No distress.  HENT:  Head: Normocephalic.  Nose: No septal deviation or nasal septal hematoma.       Dry blood to face. Uncertain source. Some dry blood to nare. Left periorbital ecchymosis  Eyes: Conjunctivae normal and EOM are normal. Pupils are equal, round, and reactive to light. Right eye exhibits no discharge. Left eye exhibits no discharge.  Neck: No tracheal deviation present.       Mild c spine tenderness  Cardiovascular: Normal rate, regular rhythm, normal heart sounds and intact distal pulses.   Pulmonary/Chest: Effort normal and breath sounds normal. No stridor.  No respiratory distress. She has no wheezes. She has no rales.  Abdominal: Soft. She exhibits no distension. There is no tenderness. There is no guarding.  Musculoskeletal: She exhibits tenderness. She exhibits no edema.       Cervical back: She exhibits tenderness and bony tenderness (mild). She exhibits no deformity.       Thoracic back: She exhibits tenderness (minimal upper back pain) and bony tenderness (minimal). She exhibits no deformity.       NV intact BUE and BLE  Neurological: She is alert and oriented to person, place, and time. She has normal strength. No cranial nerve deficit or sensory deficit. GCS eye subscore is 4. GCS verbal subscore is 5. GCS motor subscore is 6.  Skin: Skin is warm and dry.  Psychiatric: She has a normal mood and affect. Her behavior is normal.    ED Course  Procedures (including critical care time)  Labs Reviewed  BASIC METABOLIC PANEL - Abnormal; Notable for the following:    Glucose, Bld 105 (*)     GFR calc non Af Amer 75 (*)     GFR calc Af Amer 86 (*)     All other components within normal limits  CBC WITH DIFFERENTIAL - Abnormal; Notable for the following:    WBC 13.2 (*)     Platelets 149 (*)     Neutrophils Relative 84 (*)     Neutro Abs 11.1 (*)     Lymphocytes Relative 8 (*)     All other components within normal limits  PROTIME-INR - Abnormal; Notable for the following:    Prothrombin Time 21.9 (*)     INR 2.00 (*)     All other components within normal limits   Dg Chest 1 View  11/03/2012  *RADIOLOGY REPORT*  Clinical Data: Fall, pain.  CHEST - 1 VIEW  Comparison: Single view of the chest 06/04/2012.  PA and lateral chest 05/08/2011.  CT chest 05/17/2011.  Findings: The lungs are emphysematous with some unchanged left basilar scarring.  Heart size is upper normal.  No pneumothorax or pleural fluid is identified.  No focal bony abnormality is seen.  IMPRESSION: No acute finding.  Emphysema.   Original Report Authenticated By: Holley Dexter, M.D.    Dg Pelvis 1-2 Views  11/03/2012  *RADIOLOGY REPORT*  Clinical Data: Fall.  Pain.  PELVIS - 1-2 VIEW  Comparison: None.  Findings: The femoral heads are located.  No fracture is identified.  Mild bilateral degenerative change is noted.  Soft tissue structures are unremarkable.  IMPRESSION: No acute finding.   Original Report Authenticated By: Holley Dexter, M.D.    Ct Head Wo Contrast  11/03/2012  *RADIOLOGY REPORT*  Clinical Data:  Fall.  Swelling over left eye and cheek.  Loss of consciousness.  CT HEAD WITHOUT CONTRAST CT CERVICAL SPINE WITHOUT CONTRAST  Technique:  Multidetector CT imaging of the head and cervical spine was performed following the standard protocol without intravenous contrast.  Multiplanar CT image reconstructions of the cervical spine were also generated.  Comparison:  CT head without contrast 01/08/2011.  CT HEAD  Findings: No acute cortical infarct, hemorrhage, or mass lesion is present.  Mild generalized atrophy and white matter disease is similar to the prior study.  The ventricles are proportionate to the degree of atrophy.  No significant extra-axial fluid collection is present.  The paranasal sinuses and mastoid air cells are clear.  The osseous skull is intact.  Atherosclerotic calcifications are present within the cavernous carotid arteries.  A 13 mm soft tissue nodule is noted along the posterior aspect of the right maxillary sinus, just anterior to the lateral pterygoid plate.  This is lateral to the pterygoid canal.  Extensive soft tissue swelling is present over the left maxilla and lateral orbit.  Hemorrhage extends into the left supraorbital scalp.  There is no underlying fracture.  The globe is intact.  IMPRESSION:  1. 1.  Extensive left periorbital and maxillary soft tissue swelling without an underlying fracture. 2.  Stable atrophy and white matter disease.  No acute intracranial abnormality. 3.  13 mm soft tissue nodule in the posterior aspect of the  right maxillary sinus.  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  CT CERVICAL SPINE  Findings: The cervical spine is imaged from skull base through T3. Endplate degenerative changes are noted across multiple levels, most severe at C5-6 with osseous foraminal stenosis bilaterally. Moderate to osseous foraminal narrowing is present on the left and C3-4.  Emphysematous changes are noted at the lung apices bilaterally. The soft tissues of the neck demonstrate a heterogeneous thyroid with small nodules.  No dominant nodule is present.  IMPRESSION:  1.  Moderate spondylosis of the cervical spine is most pronounced at C3-4 and C5-6. 2.  No acute abnormality.   Original Report Authenticated By: Marin Roberts, M.D.    Ct Cervical Spine Wo Contrast  11/03/2012  *RADIOLOGY REPORT*  Clinical Data:  Fall.  Swelling over left eye and cheek.  Loss of consciousness.  CT HEAD WITHOUT CONTRAST CT CERVICAL SPINE WITHOUT CONTRAST  Technique:  Multidetector CT imaging of the head and cervical spine was performed following the standard protocol without intravenous contrast.  Multiplanar CT image reconstructions of the cervical spine were also generated.  Comparison:  CT head without contrast 01/08/2011.  CT HEAD  Findings: No acute cortical infarct, hemorrhage, or mass lesion is present.  Mild generalized atrophy and white matter disease is similar to the prior study.  The ventricles are proportionate to the degree of atrophy.  No significant extra-axial fluid collection is present.  The paranasal sinuses and mastoid air cells are clear.  The osseous skull is intact.  Atherosclerotic calcifications are present within the cavernous carotid arteries.  A 13 mm soft tissue nodule is noted along the posterior aspect of the right maxillary sinus, just anterior to the lateral pterygoid plate.  This is lateral to the pterygoid canal.  Extensive soft tissue swelling is  present over the left maxilla and lateral orbit.  Hemorrhage extends into the left supraorbital scalp.  There is no underlying fracture.  The globe is intact.  IMPRESSION:  1. 1.  Extensive left periorbital and maxillary soft tissue swelling without an underlying fracture. 2.  Stable atrophy and white matter disease.  No acute intracranial abnormality. 3.  13 mm soft tissue nodule in the posterior aspect of the right maxillary sinus.  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  CT CERVICAL SPINE  Findings:  The cervical spine is imaged from skull base through T3. Endplate degenerative changes are noted across multiple levels, most severe at C5-6 with osseous foraminal stenosis bilaterally. Moderate to osseous foraminal narrowing is present on the left and C3-4.  Emphysematous changes are noted at the lung apices bilaterally. The soft tissues of the neck demonstrate a heterogeneous thyroid with small nodules.  No dominant nodule is present.  IMPRESSION:  1.  Moderate spondylosis of the cervical spine is most pronounced at C3-4 and C5-6. 2.  No acute abnormality.   Original Report Authenticated By: Marin Roberts, M.D.      1. Fall   2. Head injuries   3. Facial bruising   4. Neck pain      Date: 11/03/2012  Rate: 62  Rhythm: normal sinus rhythm  QRS Axis: left  Intervals: normal  ST/T Wave abnormalities: nonspecific T wave changes  Conduction Disutrbances:none  Narrative Interpretation:   Old EKG Reviewed: unchanged    MDM    79 y/o F presents s/p mechanical fall. On coumadin. INR 2.0. CT head and neck as above. HDS except for some htn, af. HTN improved during ED course. CT head negative. INR 2.0. Neuro intact. Headache improved after tylenol. CT c-spine negative. Patient with some left sided neck pain. And entire neck "soreness". No focal bony tenderness. Cleared clinically. Patient lives at assisted living. Ambulatory  without assistance. Patient discharged home. Return precautions given. To follow up with pcp prn. patient in agreement with plan.  Likely headache s/p fall Doubt idiopathic intracranial hypertension, cerebral venous thrombosis, temporal arteritis, skull fracture, epidural hematoma, subdural hematoma, subarachnoid hemorrhage, or other intracranial hemorrhage, trigeminal neuralgia, cluster headache, eye pathology or other emergent pathology as this is an atypical history and physical, low risk, and primary diagnosis is much more likely,.     Dc home. Return precautions given. Follow up with primary care physician. Patient in agreement with plan    Labs and imaging reviewed by myself and considered in medical decision making if ordered. Imaging interpreted by radiology.   Discussed case with Dr. Radford Pax who is in agreement with assessment and plan.         Stevie Kern, MD 11/03/12 [phone]

## 2012-11-03 NOTE — ED Notes (Signed)
Pt up cleaning shower, slipped and hit face on the wall, swelling to the left eye and cheek, POS LOC, took 30 mins to get out and change clothes and call EMS

## 2012-11-03 NOTE — ED Notes (Signed)
Pt ambulated in the hall with assist, hard to keep left eye open due to swelling, mildly wobbly pt reports she lives in the cottages at the [place] home and she is checked on frequently

## 2012-11-05 NOTE — ED Provider Notes (Signed)
I saw and evaluated the patient, reviewed the resident's note and I agree with the findings and plan.   .Face to face Exam:  General:  Awake HEENT:  Atraumatic Resp:  Normal effort Abd:  Nondistended Neuro:No focal weakness Lymph: No adenopathy   Nelia Shi, MD 11/05/12 1321

## 2012-11-20 ENCOUNTER — Ambulatory Visit (INDEPENDENT_AMBULATORY_CARE_PROVIDER_SITE_OTHER): Payer: Medicare Other

## 2012-11-20 DIAGNOSIS — Z7901 Long term (current) use of anticoagulants: Secondary | ICD-10-CM

## 2012-11-20 DIAGNOSIS — I4891 Unspecified atrial fibrillation: Secondary | ICD-10-CM

## 2012-12-18 ENCOUNTER — Ambulatory Visit (INDEPENDENT_AMBULATORY_CARE_PROVIDER_SITE_OTHER): Payer: Medicare Other | Admitting: *Deleted

## 2012-12-18 DIAGNOSIS — Z7901 Long term (current) use of anticoagulants: Secondary | ICD-10-CM

## 2012-12-18 DIAGNOSIS — I4891 Unspecified atrial fibrillation: Secondary | ICD-10-CM

## 2012-12-29 ENCOUNTER — Ambulatory Visit (INDEPENDENT_AMBULATORY_CARE_PROVIDER_SITE_OTHER): Payer: Medicare Other | Admitting: Pulmonary Disease

## 2012-12-29 ENCOUNTER — Encounter: Payer: Self-pay | Admitting: Pulmonary Disease

## 2012-12-29 VITALS — BP 136/74 | HR 58 | Temp 97.7°F | Ht 66.5 in | Wt 146.0 lb

## 2012-12-29 DIAGNOSIS — J8410 Pulmonary fibrosis, unspecified: Secondary | ICD-10-CM

## 2012-12-29 DIAGNOSIS — E7800 Pure hypercholesterolemia, unspecified: Secondary | ICD-10-CM

## 2012-12-29 DIAGNOSIS — F411 Generalized anxiety disorder: Secondary | ICD-10-CM

## 2012-12-29 DIAGNOSIS — R413 Other amnesia: Secondary | ICD-10-CM

## 2012-12-29 DIAGNOSIS — M1990 Unspecified osteoarthritis, unspecified site: Secondary | ICD-10-CM

## 2012-12-29 DIAGNOSIS — K5900 Constipation, unspecified: Secondary | ICD-10-CM

## 2012-12-29 DIAGNOSIS — K219 Gastro-esophageal reflux disease without esophagitis: Secondary | ICD-10-CM

## 2012-12-29 DIAGNOSIS — I4891 Unspecified atrial fibrillation: Secondary | ICD-10-CM

## 2012-12-29 DIAGNOSIS — I10 Essential (primary) hypertension: Secondary | ICD-10-CM

## 2012-12-29 DIAGNOSIS — C649 Malignant neoplasm of unspecified kidney, except renal pelvis: Secondary | ICD-10-CM

## 2012-12-29 MED ORDER — HYDROCOD POLST-CHLORPHEN POLST 10-8 MG/5ML PO LQCR: 5.0000 mL | Freq: Two times a day (BID) | ORAL | Status: DC

## 2012-12-29 MED ORDER — FIRST-DUKES MOUTHWASH MT SUSP
OROMUCOSAL | Status: DC
Start: 2012-12-29 — End: 2013-07-17

## 2012-12-29 NOTE — Patient Instructions (Addendum)
Today we updated your med list in our EPIC system...    Continue your current medications the same...  We refilled your MMW & Tussionex per request...  Call for any questions...  Let's plan a follow up visit in 67mo, sooner if needed for problems.Marland KitchenMarland Kitchen

## 2012-12-29 NOTE — Progress Notes (Signed)
Subjective:    Patient ID: Anne Hayes, female    DOB: 07-13-1924, 79 y.o.   MRN: [id number]  HPI 79 y/o WF here for a follow up visit... she has multiple medical problems as noted below... She is followed by [name] for primary care, and we see her for pulm fibrosis.[name].  ~  May 14, 2011:  27mo ROV & referred by [name] for f/u of her chronic throat symptoms, cough, & interstitial lung disease on CXR> currently c/o cough x 42yr she says but really c/o clearing her throat a lot & these symptoms go back at least 89yrs w/ eval by ENT 2/11 DrRosen> laryngoscopy showed some edema & clinically felt to have LER & Rx w/ Omep 40mg /d but she was not regular w/ it & states it didn't help;  She had some mild basilar interstial changes on CXR back then but sl incr currently (1&1/2 yrs later), also has been on Amiodarone since 11/11 for difficult AFib...    She has had a very difficult time w/ her AFib trated by [name] & [name]> she had been tried on Flecainide, Sotalol, etc but either intol or ineffective; finally started on Amiodarone 11/11 & had 2 separate DCCV attempts but it didn't hold;  Finally had EPstudies by [name] w2/ RF ablation & has been holding NSR since then & the Amio was discontinued...    More recent eval from [name] for cough, assoc wheezing, hx bronchitis w/ CXR showing basilar ILD & no improvement after several rounds of antibiotics; interesting that she had run out of her Prevacid rx for GERD...    >> CXR 7/12 from [name] showed chronic interstitial markings towards the lower lobes & atherosclerosis in Ao...    >> We discussed the need for further eval w/ Collagen-Vasc screen, PFT, CT Chest;  If everything shows as expected she will need a vigorous Antireflux regimen to treat this condition...  ~  July 23, 2011:  459mo ROV & she has mult somatic complaints today> urine has foul odor, no dysuria, rec incr fluids & cranberry juice; c/o concern over her BP (it's 122/66  today), and LBP (chr problem, present for yrs, & has seen [name] & [name]), most recently seen by [facility] w/ adjustments & Tylenol for pain;  Also c/o memory prob & her daughter who accompanies her today wants her on meds, rec to start trial Aricept 5mg /d...    Throat symptoms> on MMW & using it 1-2 per day; she states choking episodes have stopped & throat feels better...    Pulm fibrosis> on Pulmicort & using Tussionex prn; states breathing is stable, at baseline, & she is exercising at the gym 2-3 days per week...    Reflux symptoms> on Prilosec20 before dinner, antireflux regimen, & Zantac Qhs; she denies reflux symptoms and states she is not doing these treatments regularly...  ~  December 25, 2011:  59mo ROV & she has mult rambling complaints> legs burning & recent neuropathy eval by [name] w/ NCVs that she says indicated "the start of neuropathy"; "I can't sleep" yet she takes Ambien 10mg  nightly & rests fine, tolerates well, but worries about potential side effiects (discussed TylenolPM, Melatonin, other meds and she will discuss w/ [name]); she had Cards f/u w/ [name] & Allred "he wanted to change to Xarelto but too$$$"; also c/o intermittent fecal incont- doesn't have GYN & rec to f/u w/ DrDBrodie...    Pulm Fibrosis> only using MMW as needed, not on inhalers (she never took the Pulmicort),  Tussionex, etc; she also stopped most of her prev antireflux Rx; fortunately she is not symptomatic & denies CP, SOB, cough, sputum, etc; CXR/ CTChest/ PFTs/ etc from 8/12 reviewed; last imaging was CXR & CTAbd by [name] 12/12 w/ stable fibrosis pattern at bases...    HBP/ AFib> on Coumadin, Norvasc5, Cardizem60 prn; followed by [name] & [name]- their notes are reviewed...    Chol> on Prav80 & Zetia10 w/ labs followed by [name] (he is not on EPIC)...    GI> GERD, Hx constip, c/o intermittent fecal incont> we discussed Kaegel exercises 7 she will f/u w/ DrDBrodie...    Anxiety/ Insomnia/  Memory loss> on Aricept5 & Ambien10; she will discuss w/ [name] in f/u...  ~  June 29, 2012:  319mo ROV & Anne Hayes notes that her breathing is OK, stable DOE w/o change; she had a fall & went to ER w/ XRays- no fx etc;  She had 2 recent ER visits for abd discomfort & weakness, see labs below...     She suffers from chr constip w/ c/o leakage, eval by [name]- sphincter tone was wnl...    She had Colonoscopy 7/13 by DrDBrodie> mod divertics, sm 5mm polyp removed- tubular adenoma. We reviewed prob list, meds, xrays and labs> see below for updates >>  CXR & Right Ribs 9/13>  Borderline heart size, calcif tort Ao, diffuse ILD, basilar atx, osteopenia, no fx... LABS 9/13:  Chems- wnl;  CBC- wnl;  Protime- ok followed in CC;  UA- clear   ~  December 29, 2012:  319mo ROV & Anne Hayes is here for a f/u of her pulm fibrosis> notes breathing is stable, has DOE w/ mod activity & no change from her baseline, had right CWP after fall 2/14- now resolved; she requests refill of her MMW & Tussionex... She also had bronchitis treated w/ ZPak 12/13- resolved back to baseline...    She saw [name] about 3wks ago by her hx but she did not bring in her recent med list & isn't sure of her drugs; she is reminded to carry an updated copy of his med list in her purse at all times...     She fell in her apt at [facility] 2/14> went to ER & that note is reviewed, she had left black eye & right sided chest wall bruising;  CXR showed basilar scarring L>R, heart at upper lim of normal, NAD;  CT Head & CSpine- reviewed;  Labs- reviewed & INR=2.0;  Treated conservatively & spent 2 weeks in [facility] Care [facility] at the [facility] before returning to her apt...    She saw [name] for f/u Renal cell cancer, s/p radiofreq ablation 4/10 & retreatment 11/10 w/ percut cryoablation; CT Abd 12/13 showed stable ablation changes in right kidney w/o recurrent tumor, cyst in lower pole left kid, dil of intrahep biliary ducts, remote healed right rib  fxs, stable atherosclerotic changes in Ao & branches; he plans f/u 1 yr...    She saw [name] 12/13> maintaining NSR w/o dizziness, syncope, palpit, etc; tolerating Amio & he decreased her dose to 100mg /d...    She saw GI, PaulaG 11/13 for f/u of her chr constip, but was c/o fecal incont; she had colonoscopy 7/13 by DrDBrodie showing mod sigm divertics, one sessile polyp= tubular adenoma; she was noted to be taking Miralax & fiber supplement, but KUB showed stool throughout right colon (prob overflow diarrhea) & rec to incr Miralax- symptoms improved...    We reviewed prob list, meds, xrays and  labs>>  CXR 2/14 in ER showed basilar scarring L>R, heart at upper lim of normal, NAD...          Problem List:   Hx of COUGH, THROAT CLEARING SYMPTOMS, & BASILAR INTERSTITIAL FIBROSIS >> hx intermittent and recurrent croupy cough & throat symptoms- reflux related> we discussed maximum antireflux regimen w/ PPI Bid, NPO after dinner, elev HOB 6" etc... ~  baseline CXR showed incr markings c/w mild fibrosis... ~  CXR 2/11 showed cardiomeg, & diffuse chr interstitial opac, osteopenia, NAD.[name]. ~  CXR 7/12 from [name] showed chronic interstitial markings towards the lower lobes & atherosclerosis in Ao... ~  Referred back 8/12 by DrStonking w/ recurrent symptoms likely related to worsening subclinical reflux... ~  CT Chest 8/12 showed stable emphysematous changes and basilar fibrotic changes; stable scattered pulm nodularlity w/ no suspicious lesions; aortic atheromatous changes; no signif adenopathy; RF ablation changes to right kidney... ~  PFT 8/12 showed FVC=2.23 (77%), FEV1=1.81 (88%), %1sec=81, mid-flows= 148% pred> c/w mild restriction. ~  Labs 8/12 were WNL and neg collagen-vasc screen (CBC/ BMet= wnl, ANA=neg, RF=<10, Sed=25, ACE=37, IgE<1.5)... ~  10/12: supposed to be on Pulmicort 2spBid, MMW, and max antireflux regimen- but it is unclear how much she is actually doing... ~  12/12: she saw  [name] for f/u Renal cell ca> CXR w/ bibasilar fibrosis, CT Abd w/ section thru lung bases- about the same... ~  3/13: she is off the Pulmicort, Prilosec & antireflux measures; she has mult somatic complaints but not specifically c/o cough, sputum, SOB, etc; we will continue to monitor her progress... ~  CXR & Right Ribs 9/13>  Borderline heart size, calcif tort Ao, diffuse ILD, basilar atx, osteopenia, no fx.  ~  2/14:  CXR 2/14 in ER showed basilar scarring L>R, heart at upper lim of normal, NAD...  HYPERTENSION (ICD-401.9) - on Amlodipine per [name]... ~  10/12:  She did not bring her med list to the OV today... ~  3/13:  She has seen [name] & [name] since her last visit w/ me & EPIC lists Norvasc5 & Cardizem60 "prn" per [name] but she is unaware... ~  10/13:  BP= 116/68 & she denies CP, palpit, SOB, edema... ~  4/14:  BP= 136/74 & she continues to be asymptomatic...  ATRIAL FIBRILLATION (ICD-427.31) - long hx of tachy palpit> [facility]; she is followed by [name] & [name] for LeB Cards. ~  She was tried on Flecainide & Sotalol but INTOL or ineffective;  Started on Amiodarone 11/11 but 2 unsuccessful cardioversions;  Finally had EP study & RF ablation & she has been holding NSR... ~  [name] tried to switch her from Coumadin to Xarelto but it was too $$$ & she switched back... ~  She saw [name] 12/13> maintaining NSR w/o dizziness, syncope, palpit, etc; tolerating Amio & he decreased her dose to 100mg /d  CEREBROVASCULAR DISEASE (ICD-437.9) - she had a neuro eval 2008 by DrWillis w/ hx of diplopia... ~  MRI showed some atrophy & small vessel disease...  HYPERCHOLESTEROLEMIA (ICD-272.0) - on PRAVASTATIN 80mg /d,  ZETIA 10mg /d,  FISH OIL 1000mg Bid... Labs followed by [name]... ~  EPIC EMR labs reviewed, we don't have [name]'s lab results...  GERD (ICD-530.81) - last EGD was 1998 by [facility] showing small gastric ulcer, neg HPylori, Rx  PPI.  ~  We reviewed vigorous antireflux regimen for Rx of basilar pulm fibrosis> Prilosec20 before dinner, Zantac at bedtime, elev HOB, don't eat much after dinner.  CONSTIPATION (  ICD-564.00) - prev colonoscopy 11/07 by DrDBrodie was WNL- no divertics, polyps, etc... constip Rx w/ Miralax/ Senakot OTC... ~  she had colonoscopy 7/13 by DrDBrodie showing mod sigm divertics, one sessile polyp= tubular adenoma... 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 she has urge > stress incont w/ unacceptable side effects from Detrol, Ditropan, Enablex, Veiscare... she had prev bladder tac from DrMcPhail, & Raz Urethropexy from DrTannenbaum... she has a left renal cyst- 4-60mm size, no changes from 2000 & followed by sonar... incidental finding of an 18mm enhancing lesion in right kidney, mid to lower pole- seen on CT Abd 9/08 (concern for small renal cell ca)... plan was for f/u sonar Q52mo to determine the growth rate... ~  S/p radiofreq ablation of right renal cell ca 4/10 by [name] ~  11/10: pt had percutaneous cryoablation of the right renal cell ca focal reurrence> he continues to follow the pt every 33mo... ~  f/u scan 12/12 showed stable ablation defect w/o evid of recurrent tumor or regional mets... ~  12/13:  She saw [name] for f/u Renal cell cancer, s/p radiofreq ablation 4/10 & retreatment 11/10 w/ percut cryoablation; CT Abd 12/13 showed stable ablation changes in right kidney w/o recurrent tumor, cyst in lower pole left kid, dil of intrahep biliary ducts, remote healed right rib fxs, stable atherosclerotic changes in Ao & branches; he plans f/u 1  yr  DEGENERATIVE JOINT DISEASE (ICD-715.90) - hx DJD and LBP treated by [name] and DrKritzer in the past...  MEMORY LOSS >> Followed by [name] for primary care/ Geriatrics...  ANXIETY DISORDER, GENERALIZED (ICD-300.02)   Past Medical History  Diagnosis Date  . Esophageal stricture   . Hypertension   . Atrial fibrillation   . Cerebrovascular disease, unspecified   . Hyperlipidemia   . DJD (degenerative joint disease)   . Unspecified disorder of kidney and ureter   . Renal cancer   . Osteoarthrosis, unspecified whether generalized or localized, unspecified site   . Generalized anxiety disorder   . GERD (gastroesophageal reflux disease)   . Hiatal hernia   . PN (peripheral neuropathy)     in feet  . Rectal fissure   . Gastric ulcer   . IBS (irritable bowel syndrome)   . Insomnia   . Adenomatous colon polyp 2013  . Diverticulosis   . Skin cancer of nose     Past Surgical History  Procedure Laterality Date  . Appendectomy    . Cholecystectomy    . Bunionectomies       BILATERAL  . Anterior and posterior vaginal repair  1989    AP REPAIR & RAZ URETHROPEXY  . S/p percutaneous cryoablation of right renal cell cancer  07/2009    by [name]  . Partial hysterectomy  1977    Outpatient Encounter Prescriptions as of 12/29/2012  Medication Sig Dispense Refill  . amiodarone (PACERONE) 200 MG tablet Take 200 mg by mouth daily.      [name] amLODipine (NORVASC) 2.5 MG tablet Take 2.5 mg by mouth daily.      . cetirizine (ZYRTEC) 10 MG tablet Take 10 mg by mouth daily.      [name]  Coenzyme Q10 (CO Q-10) 100 MG CAPS Take 100 mg by mouth daily.      [name] donepezil (ARICEPT) 10 MG tablet Take 10 mg by mouth at bedtime.      . folic acid (FOLVITE) 400 MCG tablet Take 400 mcg by mouth daily.        [name] gabapentin (NEURONTIN) 100 MG capsule Take 100 mg by mouth 2 (two) times daily.      . mirtazapine (REMERON) 15 MG tablet Take 15 mg by mouth daily.       . pravastatin (PRAVACHOL) 40 MG  tablet Take 40 mg by mouth at bedtime.      . pyridOXINE (VITAMIN B-6) 100 MG tablet Take 100 mg by mouth daily.      . sertraline (ZOLOFT) 25 MG tablet Take 25 mg by mouth daily.      . sodium chloride (OCEAN) 0.65 % nasal spray Place 2 sprays into the nose as needed. For dry nose      . warfarin (COUMADIN) 5 MG tablet Take 2.5-5 mg by mouth daily. 2.5 mg Monday and Friday and 5 mg all other days      . zolpidem (AMBIEN) 10 MG tablet Take 5 mg by mouth at bedtime as needed. For sleep      . mirabegron ER (MYRBETRIQ) 25 MG TB24 Take 25 mg by mouth at bedtime.      . traMADol (ULTRAM) 50 MG tablet Take 50 mg by mouth every 6 (six) hours as needed. For pain      . [DISCONTINUED] ezetimibe (ZETIA) 10 MG tablet Take 10 mg by mouth daily.         No facility-administered encounter medications on file as of 12/29/2012.    Allergies  Allergen Reactions  . Codeine Nausea And Vomiting       . Penicillins Nausea And Vomiting  . Sulfa Antibiotics Itching    Current Medications, Allergies, Past Medical History, Past Surgical History, Family History, and Social History were reviewed in [employer] record.    Review of Systems       See HPI - all other systems neg except as noted...       The patient complains of decreased hearing and dyspnea on exertion.  The patient denies anorexia, fever, weight loss, weight gain, vision loss, hoarseness, chest pain, syncope, peripheral edema, headaches, hemoptysis, abdominal pain, melena, hematochezia, hematuria, incontinence, muscle weakness, suspicious skin lesions, transient blindness, difficulty walking, depression, unusual weight change, abnormal bleeding, enlarged lymph nodes, and angioedema.     Objective:   Physical Exam     WD, WN, 79 y/o WF in NAD... GENERAL:  Alert & oriented; pleasant & cooperative... HEENT:  /AT, EOM-wnl, PERRLA, EACs-clear, TMs-wnl, NOSE-clear, THROAT-clear & wnl, no lesions seen. NECK:  Supple w/  fairROM; no JVD; normal carotid impulses w/o bruits; no thyromegaly or nodules palpated; no lymphadenopathy. CHEST:  Clear to P & A x for dry velcro rales at bases bilat... HEART:  Regular Rhythm; without murmurs/ rubs/ or gallops heard... ABDOMEN:  Soft & nontender; normal bowel sounds; no organomegaly or masses detected. EXT: without deformities, mod arthritic changes; no varicose veins/ +venous insuffic/ no edema. NEURO:  CN's intact; motor testing normal; sensory testing normal; gait normal & balance OK. DERM:  No lesions noted; no rash etc...  CXR >> 4/12 film showed left basilar scarring & atx, otherw neg.               12/12 film w/ bibasilar  scarring & chr changes.               9/13 CXR & Right Ribs>  Borderline heart size, calcif tort Ao, diffuse ILD, basilar atx, osteopenia, no fx.                CXR 2/14 in ER showed basilar scarring L>R, heart at upper lim of normal, NAD...  CT Chest 8/12 >> stable emphysematous changes and basilar fibrotic changes; stable scattered pulm nodularlity w/ no suspicious lesions; aortic atheromatous changes; no signif adenopathy; RF ablation changes to right kidney...               CT Abd 12/12 showed similar chr fibrotic changes at lung bases...  Office Spirometry 8/12 >> FVC= 2.23 (77%), FEV1= 1.81 (88%), %1sec=81, mid-flows= 148%pred >> no signif obstruction, poss mild restriction...  Labs >> WNL and neg collagen-vasc screen (CBC/ BMet= wnl, ANA=neg, RF=<10, Sed=25, ACE=37, IgE<1.5)...   Assessment & Plan:    Chronic Cough, Throat symptoms, etc>  Review of old records here shows long hx throat symptoms, intermittent hoarseness, "can't clear my throat", and cough;  She was prev treated w/ Mucinex, MMW, Antireflux regimen, and ENT eval for her throat & it was all c/w Laryngoesophageal reflux but she never followed up & never stuck w/ a vigorous antireflux regimen...  REC:  PRILOSEC 20mg  Bid taken 30 min before breakfast & dinner;  Don't eat or drink  much after dinner to allow stomach to empty completely before bedtime;  Elevate HOB on 6" blocks ==> she reports throat symptoms improved on Rx. ~3/13: she has stopped her antireflux regimen but is now essentially asymptomatic w/o cough, throat symptoms, etc... ~4/14: she wants her MMW & Tussionex refilled...  Basilar Interstitial Lung Dis>  This could be mild idiopathic pulm fibrosis, but could also be related to reflux & chr nocturnal asp, or poss to Amiodarone Rx;  The Amio was discontinued then restarted by CardsEP;  She is to start a vigorous antireflux regimen & she needs to be vigilant about it;  And her CXR should be followed Q6-12 month to look for signs of deterioration, worsening fibrosis etc...  With her recent airway symptoms> we will start PULMICORT 2 inhalations Bid ==> stable on rx. ~3/13: she never took the Pulmicort; states cough & other symptoms resolved or markedly diminished... ~4/14: serial CXRs are stable w/o acute change...   HBP>  On meds per [name] & well controlled...  AFIB>  On Rx per [name] & [name] & currently improved after RF Ablation & holding NSR (on Amio 100 now); she refused Xarelto due to cost & is back on Coumadin.  CHOL>  On Prav40 n+ Zetia & labs followed by [name]...  GERD>  She has prev seen DrMedoff & may need f/u GI eval as above...  Renal Cell Ca>  S/p percut cryoablation by [name]...  MEMORY LOSS>  They would like medication to be started for this problem & given Aricept 5mg /d...  Other medical problems as noted...   Patient's Medications  New Prescriptions   CHLORPHENIRAMINE-HYDROCODONE (TUSSIONEX PENNKINETIC ER) 10-8 MG/5ML LQCR    Take 5 mLs by mouth every 12 (twelve) hours.   DIPHENHYD-HYDROCORT-NYSTATIN (FIRST-DUKES MOUTHWASH) SUSP    1 tsp gargle and swallow four times daily as needed  Previous Medications   AMIODARONE (PACERONE) 200 MG TABLET    Take 200 mg by mouth daily.   AMLODIPINE (NORVASC) 2.5 MG TABLET    Take  2.5 mg by mouth daily.  CETIRIZINE (ZYRTEC) 10 MG TABLET    Take 10 mg by mouth daily.   COENZYME Q10 (CO Q-10) 100 MG CAPS    Take 100 mg by mouth daily.   DONEPEZIL (ARICEPT) 10 MG TABLET    Take 10 mg by mouth at bedtime.   FOLIC ACID (FOLVITE) 400 MCG TABLET    Take 400 mcg by mouth daily.     GABAPENTIN (NEURONTIN) 100 MG CAPSULE    Take 100 mg by mouth 2 (two) times daily.   MIRABEGRON ER (MYRBETRIQ) 25 MG TB24    Take 25 mg by mouth at bedtime.   MIRTAZAPINE (REMERON) 15 MG TABLET    Take 15 mg by mouth daily.    PRAVASTATIN (PRAVACHOL) 40 MG TABLET    Take 40 mg by mouth at bedtime.   PYRIDOXINE (VITAMIN B-6) 100 MG TABLET    Take 100 mg by mouth daily.   SERTRALINE (ZOLOFT) 25 MG TABLET    Take 25 mg by mouth daily.   SODIUM CHLORIDE (OCEAN) 0.65 % NASAL SPRAY    Place 2 sprays into the nose as needed. For dry nose   TRAMADOL (ULTRAM) 50 MG TABLET    Take 50 mg by mouth every 6 (six) hours as needed. For pain   WARFARIN (COUMADIN) 5 MG TABLET    Take 2.5-5 mg by mouth daily. 2.5 mg Monday and Friday and 5 mg all other days   ZOLPIDEM (AMBIEN) 10 MG TABLET    Take 5 mg by mouth at bedtime as needed. For sleep  Modified Medications   No medications on file  Discontinued Medications   EZETIMIBE (ZETIA) 10 MG TABLET    Take 10 mg by mouth daily.

## 2013-01-05 ENCOUNTER — Other Ambulatory Visit: Payer: Self-pay | Admitting: Pulmonary Disease

## 2013-01-05 MED ORDER — WARFARIN SODIUM 5 MG PO TABS: ORAL_TABLET | ORAL | Status: DC

## 2013-01-22 ENCOUNTER — Other Ambulatory Visit: Payer: Self-pay

## 2013-01-22 MED ORDER — DILTIAZEM HCL ER COATED BEADS 300 MG PO CP24: 300.0000 mg | ORAL_CAPSULE | Freq: Every day | ORAL | Status: DC

## 2013-02-03 ENCOUNTER — Ambulatory Visit (INDEPENDENT_AMBULATORY_CARE_PROVIDER_SITE_OTHER): Payer: Medicare Other | Admitting: *Deleted

## 2013-02-03 DIAGNOSIS — I4891 Unspecified atrial fibrillation: Secondary | ICD-10-CM

## 2013-02-03 DIAGNOSIS — Z7901 Long term (current) use of anticoagulants: Secondary | ICD-10-CM

## 2013-02-03 LAB — POCT INR: INR: 1.3

## 2013-02-12 ENCOUNTER — Telehealth: Payer: Self-pay | Admitting: Pulmonary Disease

## 2013-02-12 NOTE — Telephone Encounter (Signed)
Spoke with the pt and she is asking for a refill on tussionex and mmw. According to epic we sent 5 refills on 12-29-12. So I called pharmacy and they have these on file for the pt. Pt is aware.Carron Curie, CMA

## 2013-02-12 NOTE — Telephone Encounter (Signed)
Pt returned call. Anne Hayes °

## 2013-02-12 NOTE — Telephone Encounter (Signed)
[id number].Delany Steury, CMA  

## 2013-02-16 ENCOUNTER — Ambulatory Visit (INDEPENDENT_AMBULATORY_CARE_PROVIDER_SITE_OTHER): Payer: Medicare Other | Admitting: *Deleted

## 2013-02-16 DIAGNOSIS — Z7901 Long term (current) use of anticoagulants: Secondary | ICD-10-CM

## 2013-02-16 DIAGNOSIS — I4891 Unspecified atrial fibrillation: Secondary | ICD-10-CM

## 2013-02-20 ENCOUNTER — Other Ambulatory Visit: Payer: Self-pay | Admitting: Nurse Practitioner

## 2013-03-09 ENCOUNTER — Other Ambulatory Visit: Payer: Self-pay

## 2013-03-09 MED ORDER — AMIODARONE HCL 200 MG PO TABS: 200.0000 mg | ORAL_TABLET | Freq: Every day | ORAL | Status: DC

## 2013-03-10 ENCOUNTER — Ambulatory Visit (INDEPENDENT_AMBULATORY_CARE_PROVIDER_SITE_OTHER): Payer: Medicare Other | Admitting: Internal Medicine

## 2013-03-10 ENCOUNTER — Ambulatory Visit (INDEPENDENT_AMBULATORY_CARE_PROVIDER_SITE_OTHER): Payer: Medicare Other | Admitting: *Deleted

## 2013-03-10 ENCOUNTER — Encounter: Payer: Self-pay | Admitting: Internal Medicine

## 2013-03-10 VITALS — BP 145/80 | HR 56 | Ht 66.5 in | Wt 141.4 lb

## 2013-03-10 DIAGNOSIS — Z7901 Long term (current) use of anticoagulants: Secondary | ICD-10-CM

## 2013-03-10 DIAGNOSIS — I10 Essential (primary) hypertension: Secondary | ICD-10-CM

## 2013-03-10 DIAGNOSIS — I4891 Unspecified atrial fibrillation: Secondary | ICD-10-CM

## 2013-03-10 LAB — T4, FREE: Free T4: 0.94 ng/dL (ref 0.60–1.60)

## 2013-03-10 LAB — HEPATIC FUNCTION PANEL
AST: 28 U/L (ref 0–37)
Albumin: 3.7 g/dL (ref 3.5–5.2)
Total Bilirubin: 0.5 mg/dL (ref 0.3–1.2)

## 2013-03-10 LAB — TSH: TSH: 1.86 u[IU]/mL (ref 0.35–5.50)

## 2013-03-10 LAB — POCT INR: INR: 2.2

## 2013-03-10 MED ORDER — AMIODARONE HCL 200 MG PO TABS: 100.0000 mg | ORAL_TABLET | Freq: Every day | ORAL | Status: DC

## 2013-03-10 NOTE — Progress Notes (Signed)
PCP: Ginette Otto, MD Primary Cardiologist: Previously Dr Donetta Potts is a 79 y.o. female who presents today for routine electrophysiology followup.  Since last being seen in our clinic, the patient reports doing very well.  Today, she denies symptoms of palpitations, chest pain, shortness of breath,  lower extremity edema, dizziness, presyncope, or syncope.  The patient is otherwise without complaint today.   Past Medical History  Diagnosis Date  . Esophageal stricture   . Hypertension   . Atrial fibrillation   . Cerebrovascular disease, unspecified   . Hyperlipidemia   . DJD (degenerative joint disease)   . Unspecified disorder of kidney and ureter   . Renal cancer   . Osteoarthrosis, unspecified whether generalized or localized, unspecified site   . Generalized anxiety disorder   . GERD (gastroesophageal reflux disease)   . Hiatal hernia   . PN (peripheral neuropathy)     in feet  . Rectal fissure   . Gastric ulcer   . IBS (irritable bowel syndrome)   . Insomnia   . Adenomatous colon polyp 2013  . Diverticulosis   . Skin cancer of nose    Past Surgical History  Procedure Laterality Date  . Appendectomy    . Cholecystectomy    . Bunionectomies       BILATERAL  . Anterior and posterior vaginal repair  1989    AP REPAIR & RAZ URETHROPEXY  . S/p percutaneous cryoablation of right renal cell cancer  07/2009    by [name]  . Partial hysterectomy  1977    Current Outpatient Prescriptions  Medication Sig Dispense Refill  . amiodarone (PACERONE) 200 MG tablet Take 1 tablet (200 mg total) by mouth daily.  30 tablet  1  . amLODipine (NORVASC) 2.5 MG tablet Take 2.5 mg by mouth daily.      . cetirizine (ZYRTEC) 10 MG tablet Take 10 mg by mouth daily.      . chlorpheniramine-HYDROcodone (TUSSIONEX PENNKINETIC ER) 10-8 MG/5ML LQCR Take 5 mLs by mouth every 12 (twelve) hours.  140 mL  5  . Coenzyme Q10 (CO Q-10) 100 MG CAPS Take 100 mg by mouth daily.      [name]  diltiazem (CARDIZEM CD) 300 MG 24 hr capsule Take 1 capsule (300 mg total) by mouth daily.  90 capsule  1  . diltiazem (CARDIZEM CD) 300 MG 24 hr capsule TAKE 1 CAPSULE BY MOUTH DAILY.  90 capsule  0  . Diphenhyd-Hydrocort-Nystatin (FIRST-DUKES MOUTHWASH) SUSP 1 tsp gargle and swallow four times daily as needed  120 mL  5  . donepezil (ARICEPT) 10 MG tablet Take 10 mg by mouth at bedtime.      . folic acid (FOLVITE) 400 MCG tablet Take 400 mcg by mouth daily.        [name] gabapentin (NEURONTIN) 100 MG capsule Take 100 mg by mouth 2 (two) times daily.      . mirabegron ER (MYRBETRIQ) 25 MG TB24 Take 25 mg by mouth at bedtime.      . mirtazapine (REMERON) 15 MG tablet Take 15 mg by mouth daily.       . pravastatin (PRAVACHOL) 40 MG tablet Take 40 mg by mouth at bedtime.      . pyridOXINE (VITAMIN B-6) 100 MG tablet Take 100 mg by mouth daily.      . sertraline (ZOLOFT) 25 MG tablet Take 25 mg by mouth daily.      . sodium chloride (OCEAN) 0.65 % nasal spray Place  2 sprays into the nose as needed. For dry nose      . traMADol (ULTRAM) 50 MG tablet Take 50 mg by mouth every 6 (six) hours as needed. For pain      . warfarin (COUMADIN) 5 MG tablet Take as directed  45 tablet  6  . zolpidem (AMBIEN) 10 MG tablet Take 5 mg by mouth at bedtime as needed. For sleep       No current facility-administered medications for this visit.    Physical Exam: Filed Vitals:   03/10/13 1413  BP: 145/80  Pulse: 56  Height: 5' 6.5" (1.689 m)  Weight: 141 lb 6.4 oz (64.139 kg)    GEN- The patient is well appearing, alert and oriented x 3 today.   Head- normocephalic, atraumatic Eyes-  Sclera clear, conjunctiva pink Ears- hearing intact Oropharynx- clear Lungs- Clear to ausculation bilaterally, normal work of breathing Heart- Regular rate and rhythm, no murmurs, rubs or gallops, PMI not laterally displaced GI- soft, NT, ND, + BS Extremities- no clubbing, cyanosis, or edema  ekg today reveals sinus rhythm 58  bpm, PR 208, LAHB  Assessment and Plan:  1. afib Controlled Stable No change required today Amiodarone 100mg  daily Check LFTs/TFTs today Dr Kriste Basque is following lung disease closely  2. HTN Stable No change required today  Return to see Norma Fredrickson in 6 months I will see again in 1 year

## 2013-03-10 NOTE — Patient Instructions (Signed)
Your physician wants you to follow-up in: 6 months with Sunday Spillers and 12 months with Dr Jacquiline Doe will receive a reminder letter in the mail two months in advance. If you don't receive a letter, please call our office to schedule the follow-up appointment.  Your physician has recommended you make the following change in your medication:  1) Decrease Amiodarone to 100mg  daily  Your physician recommends that you have labs today:liver/TSH/T4

## 2013-03-10 NOTE — Addendum Note (Signed)
Addended by: Dennis Bast F on: 03/10/2013 02:39 PM   Modules accepted: Orders

## 2013-04-07 ENCOUNTER — Ambulatory Visit (INDEPENDENT_AMBULATORY_CARE_PROVIDER_SITE_OTHER): Payer: Medicare Other | Admitting: *Deleted

## 2013-04-07 DIAGNOSIS — Z7901 Long term (current) use of anticoagulants: Secondary | ICD-10-CM

## 2013-04-07 DIAGNOSIS — I4891 Unspecified atrial fibrillation: Secondary | ICD-10-CM

## 2013-04-07 LAB — POCT INR: INR: 2

## 2013-04-12 ENCOUNTER — Ambulatory Visit (INDEPENDENT_AMBULATORY_CARE_PROVIDER_SITE_OTHER): Payer: Medicare Other | Admitting: Adult Health

## 2013-04-12 ENCOUNTER — Encounter: Payer: Self-pay | Admitting: Adult Health

## 2013-04-12 VITALS — BP 116/62 | HR 57 | Temp 97.7°F | Ht 65.0 in | Wt 140.8 lb

## 2013-04-12 DIAGNOSIS — B351 Tinea unguium: Secondary | ICD-10-CM

## 2013-04-12 MED ORDER — CICLOPIROX 8 % EX SOLN: Freq: Every day | CUTANEOUS | Status: DC

## 2013-04-12 NOTE — Patient Instructions (Addendum)
Apply PenLac Nail Lacquer each night to affected nails , remove and start over every 7 days until resolved Do not use >6 months  Follow up with family doctor for this issue if not resolving  Please contact office for sooner follow up if symptoms do not improve or worsen or seek emergency care

## 2013-04-13 DIAGNOSIS — B351 Tinea unguium: Secondary | ICD-10-CM | POA: Insufficient documentation

## 2013-04-13 NOTE — Progress Notes (Signed)
  Subjective:    Patient ID: Anne Hayes, female    DOB: [birth year], 79 y.o.   MRN: [id number]  HPI 79 yo WF followed by Dr. Kriste Basque  For pulmonary fibrosis.   04/13/2013 Acute OV  Pt walked in clinic today requesting urgent office visit.  She complains that she took off her artificial nails this week and noticed that her right index nail has a fungus on it. She was told by her pharmacist to come right over and get Lamicil to clear this up. She is followed by Dr. Pete Glatter however she explains that he could not see her for couple of weeks. No nail pain, drainage or redness. No fever.  I explained to her that she is on statin therapy along with coumadin which would be a contraindication for long term antifungals such a lamisil .  We discussed alternative treatments and advised her to avoid artificial nails.     Review of Systems Constitutional:   No  weight loss, night sweats,  Fevers, chills,  +fatigue, or  lassitude.  HEENT:   No headaches,  Difficulty swallowing,  Tooth/dental problems, or  Sore throat,                No sneezing, itching, ear ache, nasal congestion, post nasal drip,   CV:  No chest pain,  Orthopnea, PND, swelling in lower extremities, anasarca, dizziness, palpitations, syncope.   GI  No heartburn, indigestion, abdominal pain, nausea, vomiting, diarrhea, change in bowel habits, loss of appetite, bloody stools.   Resp:   No excess mucus, no productive cough,  No non-productive cough,  No coughing up of blood.  No change in color of mucus.  No wheezing.  No chest wall deformity  Skin: right index fingernail discolored   GU: no dysuria, change in color of urine,   No flank pain, no hematuria   MS:  No joint pain or swelling.  No decreased range of motion.    Psych:  No change in mood or affect. No depression or anxiety.           Objective:   Physical Exam GEN: A/Ox3; pleasant , NAD, eldelry   HEENT:  [place]/AT,  EACs-clear, TMs-wnl, NOSE-clear, THROAT-clear, no  lesions, no postnasal drip or exudate noted.   NECK:  Supple w/ fair ROM; no JVD; normal carotid impulses w/o bruits; no thyromegaly or nodules palpated; no lymphadenopathy.  RESP  Clear  P & A; w/o, wheezes/ rales/ or rhonchi.no accessory muscle use, no dullness to percussion  CARD:  RRR, no m/r/g  , no peripheral edema, pulses intact, no cyanosis or clubbing.  Neuro: alert, no focal deficits noted.    Skin: Warm, no lesions or rashes Along right index finger nail w/ slight discoloration, cuticle nml , nail is attached and does not appear loose  Good cap refill  No obvious tinea on palms of hand or feet          Assessment & Plan:

## 2013-04-13 NOTE — Assessment & Plan Note (Addendum)
Advised to follow up with PCP  Advised to avoid artificial nails Would not use Lamisil as she is high risk due to statin rx ,. Coumadin and age.    Plan  Apply PenLac Nail Lacquer each night to affected nails , remove and start over every 7 days until resolved Do not use >6 months  Follow up with family doctor for this issue if not resolving  Please contact office for sooner follow up if symptoms do not improve or worsen or seek emergency care

## 2013-05-05 ENCOUNTER — Ambulatory Visit (INDEPENDENT_AMBULATORY_CARE_PROVIDER_SITE_OTHER): Payer: Medicare Other | Admitting: *Deleted

## 2013-05-05 DIAGNOSIS — I4891 Unspecified atrial fibrillation: Secondary | ICD-10-CM

## 2013-05-05 DIAGNOSIS — Z7901 Long term (current) use of anticoagulants: Secondary | ICD-10-CM

## 2013-05-05 LAB — POCT INR: INR: 1.3

## 2013-05-12 ENCOUNTER — Ambulatory Visit (INDEPENDENT_AMBULATORY_CARE_PROVIDER_SITE_OTHER): Payer: Medicare Other | Admitting: *Deleted

## 2013-05-12 DIAGNOSIS — I4891 Unspecified atrial fibrillation: Secondary | ICD-10-CM

## 2013-05-12 DIAGNOSIS — Z7901 Long term (current) use of anticoagulants: Secondary | ICD-10-CM

## 2013-05-12 LAB — POCT INR: INR: 2

## 2013-05-27 ENCOUNTER — Ambulatory Visit (INDEPENDENT_AMBULATORY_CARE_PROVIDER_SITE_OTHER): Payer: Medicare Other | Admitting: *Deleted

## 2013-05-27 DIAGNOSIS — I4891 Unspecified atrial fibrillation: Secondary | ICD-10-CM

## 2013-05-27 DIAGNOSIS — Z7901 Long term (current) use of anticoagulants: Secondary | ICD-10-CM

## 2013-05-27 LAB — POCT INR: INR: 1.9

## 2013-06-23 ENCOUNTER — Ambulatory Visit (INDEPENDENT_AMBULATORY_CARE_PROVIDER_SITE_OTHER): Payer: Medicare Other | Admitting: *Deleted

## 2013-06-23 DIAGNOSIS — I4891 Unspecified atrial fibrillation: Secondary | ICD-10-CM

## 2013-06-23 DIAGNOSIS — Z7901 Long term (current) use of anticoagulants: Secondary | ICD-10-CM

## 2013-07-05 ENCOUNTER — Ambulatory Visit: Payer: Medicare Other

## 2013-07-05 ENCOUNTER — Ambulatory Visit (INDEPENDENT_AMBULATORY_CARE_PROVIDER_SITE_OTHER): Payer: Medicare Other | Admitting: Family Medicine

## 2013-07-05 VITALS — BP 116/78 | HR 54 | Temp 97.8°F | Resp 18 | Ht 65.5 in | Wt 136.0 lb

## 2013-07-05 DIAGNOSIS — M25552 Pain in left hip: Secondary | ICD-10-CM

## 2013-07-05 DIAGNOSIS — M47816 Spondylosis without myelopathy or radiculopathy, lumbar region: Secondary | ICD-10-CM

## 2013-07-05 DIAGNOSIS — M47817 Spondylosis without myelopathy or radiculopathy, lumbosacral region: Secondary | ICD-10-CM

## 2013-07-05 DIAGNOSIS — M25559 Pain in unspecified hip: Secondary | ICD-10-CM

## 2013-07-05 MED ORDER — METHOCARBAMOL 500 MG PO TABS: 500.0000 mg | ORAL_TABLET | Freq: Three times a day (TID) | ORAL | Status: DC

## 2013-07-05 MED ORDER — TRAMADOL HCL 50 MG PO TABS: 50.0000 mg | ORAL_TABLET | Freq: Three times a day (TID) | ORAL | Status: DC | PRN

## 2013-07-05 NOTE — Patient Instructions (Addendum)
Try the robaxin for your back pain. Do not use it with ambien.  Remember it can make you drowsy.  Let me know if you are not feeling better in the next few days- Sooner if worse.

## 2013-07-05 NOTE — Progress Notes (Signed)
[facility] [address] [place] [address] [phone]  Date:  07/05/2013   Name:  Anne Hayes   DOB:  1924/05/20   MRN:  [id number]  PCP:  Ginette Otto, MD    Chief Complaint: Hip Pain   History of Present Illness:  Anne Hayes is a 79 y.o. very pleasant female patient who presents with the following:  Here today with left hip pain.  She is not aware of any injury but thinks she may have injured it washing a golf cart about 2 months ago.  The pain was present when she woke up the morning after she washed the cart.   She tried seeing PT at her assisted living facility which did help.   She has not noted any weakness.  No numbness.  She has been "living on tylenol" but it does not help.   She has more pain if she walks for an extended period.    She does use a cane, but otherwise her gait is ok   Patient Active Problem List   Diagnosis Date Noted  . Onychomycosis 04/13/2013  . Incontinence 08/14/2012  . Closed fracture of 5th metacarpal 02/21/2012  . Memory loss 08/04/2011  . Encounter for long-term (current) use of anticoagulants 06/20/2011  . Pulmonary interstitial fibrosis 05/14/2011  . SINUS BRADYCARDIA 09/07/2010  . RENAL CELL CANCER 11/15/2009  . HOARSENESS 11/15/2009  . CEREBROVASCULAR DISEASE 09/03/2008  . COUGH 09/03/2008  . CONSTIPATION 09/02/2008  . UNSPECIFIED DISORDER OF KIDNEY AND URETER 09/02/2008  . HYPERCHOLESTEROLEMIA 07/29/2007  . ANXIETY DISORDER, GENERALIZED 07/29/2007  . HYPERTENSION 07/29/2007  . Atrial fibrillation 07/29/2007  . GERD 07/29/2007  . DEGENERATIVE JOINT DISEASE 07/29/2007    Past Medical History  Diagnosis Date  . Esophageal stricture   . Hypertension   . Atrial fibrillation   . Cerebrovascular disease, unspecified   . Hyperlipidemia   . DJD (degenerative joint disease)   . Unspecified disorder of kidney and ureter   . Renal cancer   . Osteoarthrosis, unspecified whether generalized  or localized, unspecified site   . Generalized anxiety disorder   . GERD (gastroesophageal reflux disease)   . Hiatal hernia   . PN (peripheral neuropathy)     in feet  . Rectal fissure   . Gastric ulcer   . IBS (irritable bowel syndrome)   . Insomnia   . Adenomatous colon polyp 2013  . Diverticulosis   . Skin cancer of nose     Past Surgical History  Procedure Laterality Date  . Appendectomy    . Cholecystectomy    . Bunionectomies       BILATERAL  . Anterior and posterior vaginal repair  1989    AP REPAIR & RAZ URETHROPEXY  . S/p percutaneous cryoablation of right renal cell cancer  07/2009    by [name]  . Partial hysterectomy  1977    History  Substance Use Topics  . Smoking status: Former Smoker -- 1.00 packs/day for 50 years    Types: Cigarettes    Quit date: 09/30/1988  . Smokeless tobacco: Never Used  . Alcohol Use: 1.2 oz/week    2 Glasses of wine per week     Comment: glass of wine with dinner    Family History  Problem Relation Age of Onset  . Heart disease Father   . Colon cancer Neg Hx   . Stroke Mother     Allergies  Allergen Reactions  . Codeine Nausea  And Vomiting       . Penicillins Nausea And Vomiting  . Sulfa Antibiotics Itching    Medication list has been reviewed and updated.  Current Outpatient Prescriptions on File Prior to Visit  Medication Sig Dispense Refill  . amiodarone (PACERONE) 200 MG tablet Take 0.5 tablets (100 mg total) by mouth daily.  30 tablet  1  . amLODipine (NORVASC) 2.5 MG tablet Take 2.5 mg by mouth daily.      . cetirizine (ZYRTEC) 10 MG tablet Take 10 mg by mouth daily.      . chlorpheniramine-HYDROcodone (TUSSIONEX PENNKINETIC ER) 10-8 MG/5ML LQCR Take 5 mLs by mouth every 12 (twelve) hours.  140 mL  5  . ciclopirox (PENLAC) 8 % solution Apply topically at bedtime. Apply over nail and surrounding skin. Apply daily over previous coat. After seven (7) days, may remove with alcohol and continue cycle.  6.6 mL   5  . Coenzyme Q10 (CO Q-10) 100 MG CAPS Take 100 mg by mouth daily.      [name] diltiazem (CARDIZEM CD) 300 MG 24 hr capsule Take 1 capsule (300 mg total) by mouth daily.  90 capsule  1  . Diphenhyd-Hydrocort-Nystatin (FIRST-DUKES MOUTHWASH) SUSP 1 tsp gargle and swallow four times daily as needed  120 mL  5  . donepezil (ARICEPT) 10 MG tablet Take 10 mg by mouth at bedtime.      . folic acid (FOLVITE) 400 MCG tablet Take 400 mcg by mouth daily.        [name] gabapentin (NEURONTIN) 100 MG capsule Take 100 mg by mouth 2 (two) times daily.      . pravastatin (PRAVACHOL) 40 MG tablet Take 40 mg by mouth at bedtime.      . pyridOXINE (VITAMIN B-6) 100 MG tablet Take 100 mg by mouth daily.      . sertraline (ZOLOFT) 25 MG tablet Take 25 mg by mouth daily.      . sodium chloride (OCEAN) 0.65 % nasal spray Place 2 sprays into the nose as needed. For dry nose      . traMADol (ULTRAM) 50 MG tablet Take 50 mg by mouth every 6 (six) hours as needed. For pain      . warfarin (COUMADIN) 5 MG tablet Take as directed  45 tablet  6  . zolpidem (AMBIEN) 10 MG tablet Take 5 mg by mouth at bedtime as needed. For sleep      . mirabegron ER (MYRBETRIQ) 25 MG TB24 Take 25 mg by mouth at bedtime.      . mirtazapine (REMERON) 15 MG tablet Take 15 mg by mouth daily.        No current facility-administered medications on file prior to visit.    Review of Systems:  As per HPI- otherwise negative.   Physical Examination: Filed Vitals:   07/05/13 1205  BP: 116/78  Pulse: 54  Temp: 97.8 F (36.6 C)  Resp: 18   Filed Vitals:   07/05/13 1205  Height: 5' 5.5" (1.664 m)  Weight: 136 lb (61.689 kg)   Body mass index is 22.28 kg/(m^2). Ideal Body Weight: Weight in (lb) to have BMI = 25: 152.2  GEN: WDWN, NAD, Non-toxic, A & O x 3, looks well and younger than age HEENT: Atraumatic, Normocephalic. Neck supple. No masses, No LAD. Ears and Nose: No external deformity. CV: RRR, No M/G/R. No JVD. No thrill. No extra heart  sounds. PULM: CTA B, no wheezes, crackles, rhonchi. No retractions. No resp. distress.  No accessory muscle use. ABD: S, NT, ND, +BS. No rebound. No HSM. EXTR: No c/c/e NEURO Normal gait for age, uses a cane PSYCH: Normally interactive. Conversant. Not depressed or anxious appearing.  Calm demeanor.  Left hip: hip joint is normal, no pain with flexion, extension, abduction and adduction.  She is tender in the left glute/ lower back near the SI joint.  Back with good flexion. Normal LE strength, sensation and DTR bilaterally   [facility] reading (PRIMARY) by  Dr. Patsy Lager. Left hip: minimal degenerative change L spine: moderate degenerative change at L5/S1.  Mild elsewhere   LEFT HIP - COMPLETE 2+ VIEW  COMPARISON: None.  FINDINGS: There is no evidence of hip fracture or dislocation. Mild left hip degenerative spurring seen, without evidence of joint space narrowing. Lower lumbar spine degenerative changes also noted.  IMPRESSION: No acute findings.  Mild left hip degenerative spurring.  Lower lumbar spine degenerative changes.  EXAM: LUMBAR SPINE - 2-3 VIEW  COMPARISON: 09/10/2012 CT abdomen.  FINDINGS: Five lumbar type vertebral bodies. Sacroiliac joints are symmetric. Probable phleboliths in left hemipelvis. Maintenance of vertebral body height. Degenerative disc disease at the lumbosacral junction which is moderate. Mild nonspecific straightening of expected lumbar lordosis. Aortic atherosclerosis. Facet arthropathy in the lower lumbar spine.  IMPRESSION: Degenerative disk disease/spondylosis. No acute osseous abnormality.   Assessment and Plan: Pain in left hip - Plan: DG Lumbar Spine 2-3 Views, DG Hip Complete Left, methocarbamol (ROBAXIN) 500 MG tablet, CANCELED: DG Pelvis 1-2 Views, DISCONTINUED: traMADol (ULTRAM) 50 MG tablet  Arthritis, lumbar spine - Plan: DISCONTINUED: traMADol (ULTRAM) 50 MG tablet  Suspect her lower back pain is due in part to degenerative  changes in her spine. She has used tramadol in the past but robaxin has fewer interactions with her other medications and may be preferable. Avoid combination with ambien and/ or tussionex. She will let me know if not feeling better soon  Signed Abbe Amsterdam, MD

## 2013-07-14 ENCOUNTER — Ambulatory Visit (INDEPENDENT_AMBULATORY_CARE_PROVIDER_SITE_OTHER): Payer: Medicare Other | Admitting: *Deleted

## 2013-07-14 DIAGNOSIS — Z7901 Long term (current) use of anticoagulants: Secondary | ICD-10-CM

## 2013-07-14 DIAGNOSIS — I4891 Unspecified atrial fibrillation: Secondary | ICD-10-CM

## 2013-07-14 LAB — POCT INR: INR: 1.8

## 2013-07-17 ENCOUNTER — Emergency Department (HOSPITAL_COMMUNITY): Payer: Medicare Other

## 2013-07-17 ENCOUNTER — Observation Stay (HOSPITAL_COMMUNITY)
Admission: EM | Admit: 2013-07-17 | Discharge: 2013-07-17 | Disposition: A | Discharge status: Home / Self Care | Payer: Medicare Other | Attending: Internal Medicine | Admitting: Internal Medicine

## 2013-07-17 ENCOUNTER — Encounter (HOSPITAL_COMMUNITY): Payer: Self-pay | Admitting: Emergency Medicine

## 2013-07-17 DIAGNOSIS — R079 Chest pain, unspecified: Secondary | ICD-10-CM

## 2013-07-17 DIAGNOSIS — C649 Malignant neoplasm of unspecified kidney, except renal pelvis: Secondary | ICD-10-CM | POA: Insufficient documentation

## 2013-07-17 DIAGNOSIS — E785 Hyperlipidemia, unspecified: Secondary | ICD-10-CM | POA: Insufficient documentation

## 2013-07-17 DIAGNOSIS — Z885 Allergy status to narcotic agent status: Secondary | ICD-10-CM | POA: Insufficient documentation

## 2013-07-17 DIAGNOSIS — C760 Malignant neoplasm of head, face and neck: Secondary | ICD-10-CM | POA: Insufficient documentation

## 2013-07-17 DIAGNOSIS — Z7901 Long term (current) use of anticoagulants: Secondary | ICD-10-CM | POA: Insufficient documentation

## 2013-07-17 DIAGNOSIS — Z79899 Other long term (current) drug therapy: Secondary | ICD-10-CM | POA: Insufficient documentation

## 2013-07-17 DIAGNOSIS — N289 Disorder of kidney and ureter, unspecified: Secondary | ICD-10-CM | POA: Insufficient documentation

## 2013-07-17 DIAGNOSIS — K222 Esophageal obstruction: Secondary | ICD-10-CM | POA: Insufficient documentation

## 2013-07-17 DIAGNOSIS — Z860100 Personal history of colon polyps, unspecified: Secondary | ICD-10-CM | POA: Insufficient documentation

## 2013-07-17 DIAGNOSIS — G609 Hereditary and idiopathic neuropathy, unspecified: Secondary | ICD-10-CM | POA: Insufficient documentation

## 2013-07-17 DIAGNOSIS — M1990 Unspecified osteoarthritis, unspecified site: Secondary | ICD-10-CM | POA: Insufficient documentation

## 2013-07-17 DIAGNOSIS — R110 Nausea: Secondary | ICD-10-CM | POA: Insufficient documentation

## 2013-07-17 DIAGNOSIS — Z87891 Personal history of nicotine dependence: Secondary | ICD-10-CM | POA: Insufficient documentation

## 2013-07-17 DIAGNOSIS — I495 Sick sinus syndrome: Secondary | ICD-10-CM | POA: Diagnosis present

## 2013-07-17 DIAGNOSIS — I679 Cerebrovascular disease, unspecified: Secondary | ICD-10-CM | POA: Insufficient documentation

## 2013-07-17 DIAGNOSIS — G4700 Insomnia, unspecified: Secondary | ICD-10-CM | POA: Insufficient documentation

## 2013-07-17 DIAGNOSIS — I4891 Unspecified atrial fibrillation: Secondary | ICD-10-CM | POA: Diagnosis present

## 2013-07-17 DIAGNOSIS — Z882 Allergy status to sulfonamides status: Secondary | ICD-10-CM | POA: Insufficient documentation

## 2013-07-17 DIAGNOSIS — K589 Irritable bowel syndrome without diarrhea: Secondary | ICD-10-CM | POA: Insufficient documentation

## 2013-07-17 DIAGNOSIS — F411 Generalized anxiety disorder: Secondary | ICD-10-CM | POA: Insufficient documentation

## 2013-07-17 DIAGNOSIS — R0789 Other chest pain: Principal | ICD-10-CM | POA: Insufficient documentation

## 2013-07-17 DIAGNOSIS — I10 Essential (primary) hypertension: Secondary | ICD-10-CM | POA: Diagnosis present

## 2013-07-17 DIAGNOSIS — K219 Gastro-esophageal reflux disease without esophagitis: Secondary | ICD-10-CM | POA: Diagnosis present

## 2013-07-17 DIAGNOSIS — Z880 Allergy status to penicillin: Secondary | ICD-10-CM | POA: Insufficient documentation

## 2013-07-17 DIAGNOSIS — K259 Gastric ulcer, unspecified as acute or chronic, without hemorrhage or perforation: Secondary | ICD-10-CM | POA: Insufficient documentation

## 2013-07-17 LAB — CBC WITH DIFFERENTIAL/PLATELET
Basophils Relative: 0 % (ref 0–1)
HCT: 38.2 % (ref 36.0–46.0)
Hemoglobin: 13.2 g/dL (ref 12.0–15.0)
Lymphocytes Relative: 25 % (ref 12–46)
Lymphs Abs: 1.7 10*3/uL (ref 0.7–4.0)
MCHC: 34.6 g/dL (ref 30.0–36.0)
Monocytes Absolute: 0.7 10*3/uL (ref 0.1–1.0)
Monocytes Relative: 10 % (ref 3–12)
Neutro Abs: 4.2 10*3/uL (ref 1.7–7.7)
Neutrophils Relative %: 62 % (ref 43–77)
RBC: 4.07 MIL/uL (ref 3.87–5.11)
WBC: 6.8 10*3/uL (ref 4.0–10.5)

## 2013-07-17 LAB — BASIC METABOLIC PANEL
BUN: 22 mg/dL (ref 6–23)
CO2: 25 mEq/L (ref 19–32)
Chloride: 109 mEq/L (ref 96–112)
Creatinine, Ser: 0.89 mg/dL (ref 0.50–1.10)
GFR calc Af Amer: 66 mL/min — ABNORMAL LOW (ref >90)
GFR calc non Af Amer: 57 mL/min — ABNORMAL LOW (ref >90)
Glucose, Bld: 96 mg/dL (ref 70–99)
Potassium: 3.7 mEq/L (ref 3.5–5.1)

## 2013-07-17 LAB — POCT I-STAT TROPONIN I

## 2013-07-17 LAB — PROTIME-INR: INR: 2.18 — ABNORMAL HIGH (ref 0.00–1.49)

## 2013-07-17 MED ORDER — GABAPENTIN 100 MG PO CAPS
100.0000 mg | ORAL_CAPSULE | Freq: Two times a day (BID) | ORAL | Status: DC
  Administered 2013-07-17: 100 mg via ORAL
  Filled 2013-07-17 (×2): qty 1

## 2013-07-17 MED ORDER — VITAMIN B-6 100 MG PO TABS
100.0000 mg | ORAL_TABLET | Freq: Every day | ORAL | Status: DC
  Administered 2013-07-17: 100 mg via ORAL
  Filled 2013-07-17: qty 1

## 2013-07-17 MED ORDER — MIRABEGRON ER 25 MG PO TB24
25.0000 mg | ORAL_TABLET | Freq: Every day | ORAL | Status: DC
  Filled 2013-07-17: qty 1

## 2013-07-17 MED ORDER — ZOLPIDEM TARTRATE 5 MG PO TABS: 10.0000 mg | ORAL_TABLET | Freq: Every evening | ORAL | Status: DC | PRN

## 2013-07-17 MED ORDER — ASPIRIN 81 MG PO CHEW: 81.0000 mg | CHEWABLE_TABLET | Freq: Every day | ORAL | Status: DC

## 2013-07-17 MED ORDER — ONDANSETRON HCL 4 MG/2ML IJ SOLN: 4.0000 mg | Freq: Four times a day (QID) | INTRAMUSCULAR | Status: DC | PRN

## 2013-07-17 MED ORDER — OMEPRAZOLE 40 MG PO CPDR: 40.0000 mg | DELAYED_RELEASE_CAPSULE | Freq: Every day | ORAL | Status: DC

## 2013-07-17 MED ORDER — LORATADINE 10 MG PO TABS
10.0000 mg | ORAL_TABLET | Freq: Every day | ORAL | Status: DC
  Administered 2013-07-17: 10 mg via ORAL
  Filled 2013-07-17: qty 1

## 2013-07-17 MED ORDER — ACETAMINOPHEN 325 MG PO TABS: 650.0000 mg | ORAL_TABLET | ORAL | Status: DC | PRN

## 2013-07-17 MED ORDER — DONEPEZIL HCL 10 MG PO TABS
10.0000 mg | ORAL_TABLET | Freq: Every day | ORAL | Status: DC
  Filled 2013-07-17: qty 1

## 2013-07-17 MED ORDER — SIMVASTATIN 20 MG PO TABS
20.0000 mg | ORAL_TABLET | Freq: Every day | ORAL | Status: DC
  Filled 2013-07-17: qty 1

## 2013-07-17 MED ORDER — WARFARIN SODIUM 5 MG PO TABS: 5.0000 mg | ORAL_TABLET | Freq: Every day | ORAL | Status: DC

## 2013-07-17 MED ORDER — WARFARIN - PHARMACIST DOSING INPATIENT: Freq: Every day | Status: DC

## 2013-07-17 MED ORDER — DILTIAZEM HCL ER COATED BEADS 300 MG PO CP24
300.0000 mg | ORAL_CAPSULE | Freq: Every day | ORAL | Status: DC
  Administered 2013-07-17: 300 mg via ORAL
  Filled 2013-07-17: qty 1

## 2013-07-17 MED ORDER — FOLIC ACID 1 MG PO TABS
1.0000 mg | ORAL_TABLET | Freq: Every day | ORAL | Status: DC
  Administered 2013-07-17: 1 mg via ORAL
  Filled 2013-07-17: qty 1

## 2013-07-17 MED ORDER — WARFARIN SODIUM 7.5 MG PO TABS
7.5000 mg | ORAL_TABLET | Freq: Once | ORAL | Status: AC
  Filled 2013-07-17: qty 1

## 2013-07-17 MED ORDER — ZOLPIDEM TARTRATE 5 MG PO TABS: 5.0000 mg | ORAL_TABLET | Freq: Every evening | ORAL | Status: DC | PRN

## 2013-07-17 MED ORDER — AMIODARONE HCL 200 MG PO TABS
200.0000 mg | ORAL_TABLET | Freq: Every day | ORAL | Status: DC
  Administered 2013-07-17: 200 mg via ORAL
  Filled 2013-07-17: qty 1

## 2013-07-17 MED ORDER — AMLODIPINE BESYLATE 2.5 MG PO TABS
2.5000 mg | ORAL_TABLET | Freq: Every day | ORAL | Status: DC
  Administered 2013-07-17: 2.5 mg via ORAL
  Filled 2013-07-17: qty 1

## 2013-07-17 MED ORDER — EZETIMIBE 10 MG PO TABS
10.0000 mg | ORAL_TABLET | Freq: Every day | ORAL | Status: DC
  Administered 2013-07-17: 10 mg via ORAL
  Filled 2013-07-17: qty 1

## 2013-07-17 MED ORDER — PRAVASTATIN SODIUM 40 MG PO TABS
40.0000 mg | ORAL_TABLET | Freq: Every day | ORAL | Status: DC
  Filled 2013-07-17: qty 1

## 2013-07-17 NOTE — H&P (Signed)
[facility] History and Physical  Anne Hayes RUE:[id number] DOB: [birth year] DOA: 07/17/2013  Referring physician: ED PCP: Ginette Otto, MD  Specialists: [facility] (cards)  Chief Complaint: Chest pain  HPI: Anne Hayes is a 79 y.o. female who presents to the ED after waking up from sleep with chest pain.  Pain was located across her chest, described as "like a tooth-ache", severe initially 10/10.  Improved to 8/10 with SL NTG and presently 2/10 in intensity.  She did have some associated nausea but no associated SOB.  No aggravating factors although per daughter she had been more active today than in weeks (having done very aggressive physical therapy for left arm and back pain).  Patient does have h/o A.Fib s/p cardioversion in 2011, currently in NSR, on chronic coumadin.  Review of Systems: 12 systems reviewed and otherwise negative.  Past Medical History  Diagnosis Date  . Esophageal stricture   . Hypertension   . Atrial fibrillation   . Cerebrovascular disease, unspecified   . Hyperlipidemia   . DJD (degenerative joint disease)   . Unspecified disorder of kidney and ureter   . Renal cancer   . Osteoarthrosis, unspecified whether generalized or localized, unspecified site   . Generalized anxiety disorder   . GERD (gastroesophageal reflux disease)   . Hiatal hernia   . PN (peripheral neuropathy)     in feet  . Rectal fissure   . Gastric ulcer   . IBS (irritable bowel syndrome)   . Insomnia   . Adenomatous colon polyp 2013  . Diverticulosis   . Skin cancer of nose    Past Surgical History  Procedure Laterality Date  . Appendectomy    . Cholecystectomy    . Bunionectomies       BILATERAL  . Anterior and posterior vaginal repair  1989    AP REPAIR & RAZ URETHROPEXY  . S/p percutaneous cryoablation of right renal cell cancer  07/2009    by [name]  . Partial hysterectomy  1977   Social History:  reports that she quit smoking about 24 years ago.  Her smoking use included Cigarettes. She has a 50 pack-year smoking history. She has never used smokeless tobacco. She reports that she drinks about 1.2 ounces of alcohol per week. She reports that she does not use illicit drugs.   Allergies  Allergen Reactions  . Other Nausea And Vomiting    Any kind of BELL PEPPER.   Extreme, violent nausea and vomiting quickly leading to dehydration  . Codeine Nausea And Vomiting       . Penicillins Nausea And Vomiting  . Sulfa Antibiotics Itching    Family History  Problem Relation Age of Onset  . Heart disease Father   . Colon cancer Neg Hx   . Stroke Mother     Prior to Admission medications   Medication Sig Start Date End Date Taking? Authorizing Provider  amiodarone (PACERONE) 200 MG tablet Take 200 mg by mouth daily.   Yes Historical Provider, MD  amLODipine (NORVASC) 2.5 MG tablet Take 2.5 mg by mouth daily.   Yes Historical Provider, MD  aspirin 81 MG chewable tablet Chew 81 mg by mouth daily.   Yes Historical Provider, MD  BIOTIN PO Take 1 tablet by mouth daily.   Yes Historical Provider, MD  cetirizine (ZYRTEC) 10 MG tablet Take 10 mg by mouth daily.   Yes Historical Provider, MD  Cholecalciferol (VITAMIN D PO) Take 1 tablet by mouth daily.   Yes  Historical Provider, MD  Coenzyme Q10 (CO Q-10) 100 MG CAPS Take 100 mg by mouth daily.   Yes Historical Provider, MD  diltiazem (CARDIZEM CD) 300 MG 24 hr capsule Take 1 capsule (300 mg total) by mouth daily. 01/22/13  Yes Hillis Range, MD  donepezil (ARICEPT) 10 MG tablet Take 10 mg by mouth at bedtime.   Yes Historical Provider, MD  ezetimibe (ZETIA) 10 MG tablet Take 10 mg by mouth daily.   Yes Historical Provider, MD  folic acid (FOLVITE) 400 MCG tablet Take 400 mcg by mouth daily.     Yes Historical Provider, MD  gabapentin (NEURONTIN) 100 MG capsule Take 100 mg by mouth 2 (two) times daily.   Yes Historical Provider, MD  mirabegron ER (MYRBETRIQ) 25 MG TB24 Take 25 mg by mouth at  bedtime.   Yes Historical Provider, MD  Omega-3 Fatty Acids (FISH OIL PO) Take 1 tablet by mouth daily.   Yes Historical Provider, MD  pravastatin (PRAVACHOL) 40 MG tablet Take 40 mg by mouth at bedtime.   Yes Historical Provider, MD  pyridOXINE (VITAMIN B-6) 100 MG tablet Take 100 mg by mouth daily.   Yes Historical Provider, MD  sodium chloride (OCEAN) 0.65 % nasal spray Place 2 sprays into the nose as needed. For dry nose   Yes Historical Provider, MD  warfarin (COUMADIN) 5 MG tablet Take as directed 01/05/13  Yes Michele Mcalpine, MD  warfarin (COUMADIN) 5 MG tablet Take 5-7.5 mg by mouth daily. Takes 5 mg daily, except Wednesday and Saturday, then takes 7.5mg    Yes Historical Provider, MD  zolpidem (AMBIEN) 10 MG tablet Take 10 mg by mouth at bedtime as needed. For sleep   Yes Historical Provider, MD   Physical Exam: Filed Vitals:   07/17/13 0533  BP: 142/62  Pulse:   Temp:   Resp: 12    General:  NAD, resting comfortably in bed Eyes: PEERLA EOMI ENT: mucous membranes moist Neck: supple w/o JVD Cardiovascular: RRR w/o MRG Respiratory: CTA B Abdomen: soft, nt, nd, bs+ Skin: no rash nor lesion Musculoskeletal: MAE, full ROM all 4 extremities, does have back tenderness to palpation on the left side which is associated with her shoulder and arm pain but different than the chest pain she was having Psychiatric: normal tone and affect Neurologic: AAOx3, grossly non-focal  Labs on Admission:  Basic Metabolic Panel:  Recent Labs Lab 07/17/13 0425  NA 143  K 3.7  CL 109  CO2 25  GLUCOSE 96  BUN 22  CREATININE 0.89  CALCIUM 8.7   Liver Function Tests: No results found for this basename: AST, ALT, ALKPHOS, BILITOT, PROT, ALBUMIN,  in the last 168 hours No results found for this basename: LIPASE, AMYLASE,  in the last 168 hours No results found for this basename: AMMONIA,  in the last 168 hours CBC:  Recent Labs Lab 07/17/13 0425  WBC 6.8  NEUTROABS 4.2  HGB 13.2  HCT  38.2  MCV 93.9  PLT 140*   Cardiac Enzymes:  Recent Labs Lab 07/17/13 0425  TROPONINI <0.30    BNP (last 3 results) No results found for this basename: PROBNP,  in the last 8760 hours CBG: No results found for this basename: GLUCAP,  in the last 168 hours  Radiological Exams on Admission: Dg Chest 2 View  07/17/2013   CLINICAL DATA:  Shortness of breath  EXAM: CHEST  2 VIEW  COMPARISON:  Prior radiograph from 11/03/2012  FINDINGS: Cardiac and mediastinal silhouettes  are stable in size and contour, and remain within normal limits.  Emphysematous changes with prominence of the interstitial markings within the perihilar regions and lower lobes is again seen, similar to prior. No new focal infiltrate. There is probable left basilar atelectasis. No pulmonary edema or pleural effusion.  Multilevel degenerative disc disease is seen within the visualized spine. There is diffuse osteopenia. No acute osseous abnormality.  IMPRESSION: No active cardiopulmonary disease. Emphysema.   Electronically Signed   By: Rise Mu M.D.   On: 07/17/2013 05:44    EKG: Independently reviewed.  Assessment/Plan Principal Problem:   Chest pain   1. Chest pain - patients pain is significantly improved at this point in time, serial troponin ordered, admitting patient to tele monitor, chest pain obs protocol, HEART score = 4 with 2 points for age, 1 point for h/o HTN, and 1 point for moderately concerning story for subjective story especially given the improvement with NTG.  Likely needs decision by cards for inpatient vs outpatient rule out.  Does follow up with cardiology fairly routinely already for coumadin and office visits.    Code Status: Full (must indicate code status--if unknown or must be presumed, indicate so) Family Communication: Spoke with daughter at bedside (indicate person spoken with, if applicable, with phone number if by telephone) Disposition Plan: Admit to obs (indicate  anticipated LOS)  Time spent: 50 min  GARDNER, JARED M. [facility] Pager [phone]  If 7PM-7AM, please contact night-coverage [email or web address] Password [facility] 07/17/2013, 6:32 AM

## 2013-07-17 NOTE — ED Notes (Signed)
PER EMS: pt from home, reports dull  CP for the past 2 days and reports she was very nauseous earlier today. Given 2 nitro PTA and pain decreased from 10/10 to 8/10. Reports been more active today such as shopping. Pt reports the pain radiated down left arm yesterday but not today. Hx of a fib and ablation. VS: BP- 122/74, HR-54, RR-16, O2-94% RA. 20g IV LAC.

## 2013-07-17 NOTE — Discharge Summary (Signed)
Physician Discharge Summary  Anne Hayes HYQ:[id number] DOB: 1924/07/16 DOA: 07/17/2013  PCP: Ginette Otto, MD  Admit date: 07/17/2013 Discharge date: 07/17/2013  Recommendations for Outpatient Follow-up:  1.   Discharge Diagnoses:  Principal Problem:   Chest pain   Discharge Condition: stable  Filed Weights   07/17/13 0706  Weight: 60.374 kg (133 lb 1.6 oz)    History of present illness:  Anne Hayes is a 79 y.o. female who presents to the ED after waking up from sleep with chest pain. Pain was located across her chest, described as "like a tooth-ache", severe initially 10/10. Improved to 8/10 with SL NTG and presently 2/10 in intensity. She did have some associated nausea but no associated SOB. No aggravating factors although per daughter she had been more active today than in weeks (having done very aggressive physical therapy for left arm and back pain).  Patient does have h/o A.Fib s/p cardioversion in 2011, currently in NSR, on chronic coumadin.   Hospital Course:  Patient was observed on telemetry. MI ruled out. No further chest pain. Suspect musculoskeletal. May follow up with cardiology to consider outpatient stress test.  Procedures:  none  Consultations:  none  Discharge Exam: Filed Vitals:   07/17/13 0706  BP: 165/63  Pulse: 51  Temp: 97.8 F (36.6 C)  Resp: 18    General: comfortable Cardiovascular: RRR without MGR Respiratory: CTA without WRR  Discharge Instructions  Discharge Orders   Future Appointments Provider Department Dept Phone   07/28/2013 3:30 PM [facility] [place] Office [phone]   Future Orders Complete By Expires   Activity as tolerated - No restrictions  As directed    Diet - low sodium heart healthy  As directed        Medication List         AMBIEN 10 MG tablet  Generic drug:  zolpidem  Take 10 mg by mouth at bedtime as needed. For sleep     amiodarone 200 MG tablet   Commonly known as:  PACERONE  Take 200 mg by mouth daily.     amLODipine 2.5 MG tablet  Commonly known as:  NORVASC  Take 2.5 mg by mouth daily.     aspirin 81 MG chewable tablet  Chew 81 mg by mouth daily.     BIOTIN PO  Take 1 tablet by mouth daily.     cetirizine 10 MG tablet  Commonly known as:  ZYRTEC  Take 10 mg by mouth daily.     Co Q-10 100 MG Caps  Take 100 mg by mouth daily.     diltiazem 300 MG 24 hr capsule  Commonly known as:  CARDIZEM CD  Take 1 capsule (300 mg total) by mouth daily.     donepezil 10 MG tablet  Commonly known as:  ARICEPT  Take 10 mg by mouth at bedtime.     ezetimibe 10 MG tablet  Commonly known as:  ZETIA  Take 10 mg by mouth daily.     FISH OIL PO  Take 1 tablet by mouth daily.     folic acid 400 MCG tablet  Commonly known as:  FOLVITE  Take 400 mcg by mouth daily.     gabapentin 100 MG capsule  Commonly known as:  NEURONTIN  Take 100 mg by mouth 2 (two) times daily.     MYRBETRIQ 25 MG Tb24 tablet  Generic drug:  mirabegron ER  Take 25 mg by mouth at  bedtime.     omeprazole 40 MG capsule  Commonly known as:  PRILOSEC  Take 1 capsule (40 mg total) by mouth daily.     pravastatin 40 MG tablet  Commonly known as:  PRAVACHOL  Take 40 mg by mouth at bedtime.     pyridOXINE 100 MG tablet  Commonly known as:  VITAMIN B-6  Take 100 mg by mouth daily.     sodium chloride 0.65 % nasal spray  Commonly known as:  OCEAN  Place 2 sprays into the nose as needed. For dry nose     VITAMIN D PO  Take 1 tablet by mouth daily.     warfarin 5 MG tablet  Commonly known as:  COUMADIN  Take as directed     warfarin 5 MG tablet  Commonly known as:  COUMADIN  Take 5-7.5 mg by mouth daily. Takes 5 mg daily, except Wednesday and Saturday, then takes 7.5mg        Allergies  Allergen Reactions  . Other Nausea And Vomiting    Any kind of BELL PEPPER.   Extreme, violent nausea and vomiting quickly leading to dehydration  . Codeine  Nausea And Vomiting       . Penicillins Nausea And Vomiting  . Sulfa Antibiotics Itching       Follow-up Information   Follow up with Hillis Range, MD. (to consider stress test)    Specialty:  Cardiology   Contact information:   [address] [place] [address] [phone]       Follow up with Ginette Otto, MD In 3 weeks.   Specialty:  Internal Medicine   Contact information:   [address] [place] [address] [phone]        The results of significant diagnostics from this hospitalization (including imaging, microbiology, ancillary and laboratory) are listed below for reference.    Significant Diagnostic Studies: Dg Chest 2 View  07/17/2013   CLINICAL DATA:  Shortness of breath  EXAM: CHEST  2 VIEW  COMPARISON:  Prior radiograph from 11/03/2012  FINDINGS: Cardiac and mediastinal silhouettes are stable in size and contour, and remain within normal limits.  Emphysematous changes with prominence of the interstitial markings within the perihilar regions and lower lobes is again seen, similar to prior. No new focal infiltrate. There is probable left basilar atelectasis. No pulmonary edema or pleural effusion.  Multilevel degenerative disc disease is seen within the visualized spine. There is diffuse osteopenia. No acute osseous abnormality.  IMPRESSION: No active cardiopulmonary disease. Emphysema.   Electronically Signed   By: Rise Mu M.D.   On: 07/17/2013 05:44   Dg Lumbar Spine 2-3 Views  07/05/2013   CLINICAL DATA:  Low back pain without trauma.  EXAM: LUMBAR SPINE - 2-3 VIEW  COMPARISON:  09/10/2012 CT abdomen.  FINDINGS: Five lumbar type vertebral bodies. Sacroiliac joints are symmetric. Probable phleboliths in left hemipelvis. Maintenance of vertebral body height. Degenerative disc disease at the lumbosacral junction which is moderate. Mild nonspecific straightening of expected lumbar lordosis. Aortic atherosclerosis.  Facet arthropathy in the lower lumbar spine.  IMPRESSION: Degenerative disk disease/spondylosis. No acute osseous abnormality.   Electronically Signed   By: Jeronimo Greaves M.D.   On: 07/05/2013 15:09   Dg Hip Complete Left  07/05/2013   CLINICAL DATA:  Left hip pain.  EXAM: LEFT HIP - COMPLETE 2+ VIEW  COMPARISON:  None.  FINDINGS: There is no evidence of hip fracture or dislocation. Mild left  hip degenerative spurring seen, without evidence of joint space narrowing. Lower lumbar spine degenerative changes also noted.  IMPRESSION: No acute findings.  Mild left hip degenerative spurring.  Lower lumbar spine degenerative changes.   Electronically Signed   By: Myles Rosenthal M.D.   On: 07/05/2013 15:08    Microbiology: No results found for this or any previous visit (from the past 240 hour(s)).   Labs: Basic Metabolic Panel:  Recent Labs Lab 07/17/13 0425  NA 143  K 3.7  CL 109  CO2 25  GLUCOSE 96  BUN 22  CREATININE 0.89  CALCIUM 8.7   Liver Function Tests: No results found for this basename: AST, ALT, ALKPHOS, BILITOT, PROT, ALBUMIN,  in the last 168 hours No results found for this basename: LIPASE, AMYLASE,  in the last 168 hours No results found for this basename: AMMONIA,  in the last 168 hours CBC:  Recent Labs Lab 07/17/13 0425  WBC 6.8  NEUTROABS 4.2  HGB 13.2  HCT 38.2  MCV 93.9  PLT 140*   Cardiac Enzymes:  Recent Labs Lab 07/17/13 0425  TROPONINI <0.30   BNP: BNP (last 3 results) No results found for this basename: PROBNP,  in the last 8760 hours CBG: No results found for this basename: GLUCAP,  in the last 168 hours  EKG NSR Inferior infarct, age indeterminate Anteroseptal infarct, age indeterminate Prolonged QT interval Signed:  Konstantin Lehnen L  [facility] 07/17/2013, 10:22 AM

## 2013-07-17 NOTE — ED Provider Notes (Signed)
Medical screening examination/treatment/procedure(s) were conducted as a shared visit with non-physician practitioner(s) and myself.  I personally evaluated the patient during the encounter  79 yo female presents with sudden onset of CP prior to arrival.  Described as pressure like and patient is nauseated.  Given ASA and nitro in route with some improvement.  Non-ischemic EKG.  GIven patient's age, risk factors and time of onset, patient will be admitted for serial enzymes.  Work-up in ER otherwise unremarkable.  Shon Baton, MD 07/17/13 [phone]

## 2013-07-17 NOTE — ED Notes (Signed)
Dr. Horton at bedside. 

## 2013-07-17 NOTE — ED Notes (Signed)
Report attempted x 1

## 2013-07-17 NOTE — Progress Notes (Signed)
ANTICOAGULATION CONSULT NOTE - Initial Consult  Pharmacy Consult for Coumadin Indication: atrial fibrillation  Allergies  Allergen Reactions  . Other Nausea And Vomiting    Any kind of BELL PEPPER.   Extreme, violent nausea and vomiting quickly leading to dehydration  . Codeine Nausea And Vomiting       . Penicillins Nausea And Vomiting  . Sulfa Antibiotics Itching    Patient Measurements:    Vital Signs: Temp: 97.7 F (36.5 C) (10/18 0406) Temp src: Oral (10/18 0406) BP: 142/62 mmHg (10/18 0533) Pulse Rate: 48 (10/18 0500)  Labs:  Recent Labs  07/14/13 1609 07/17/13 0425 07/17/13 0426  HGB  --  13.2  --   HCT  --  38.2  --   PLT  --  140*  --   LABPROT  --   --  23.6*  INR 1.8  --  2.18*  CREATININE  --  0.89  --   TROPONINI  --  <0.30  --     The CrCl is unknown because both a height and weight (above a minimum accepted value) are required for this calculation.   Medical History: Past Medical History  Diagnosis Date  . Esophageal stricture   . Hypertension   . Atrial fibrillation   . Cerebrovascular disease, unspecified   . Hyperlipidemia   . DJD (degenerative joint disease)   . Unspecified disorder of kidney and ureter   . Renal cancer   . Osteoarthrosis, unspecified whether generalized or localized, unspecified site   . Generalized anxiety disorder   . GERD (gastroesophageal reflux disease)   . Hiatal hernia   . PN (peripheral neuropathy)     in feet  . Rectal fissure   . Gastric ulcer   . IBS (irritable bowel syndrome)   . Insomnia   . Adenomatous colon polyp 2013  . Diverticulosis   . Skin cancer of nose     Medications:  Amiodarone  Norvasc  ASA  Zyrtec  Vit D  Cardizem  Aricept  Zetia  Flic acid  Neurontin  Myrbetriq  Fish Oil  Pravachol  Vit B6  Ambien  Coumadin 5 mg daily except 7.5 mg WedSat  Assessment: 79 yo female admitted with chest pain, h/o Afib, to continue Coumadin  Goal of Therapy:  INR 2-3 Monitor platelets by  anticoagulation protocol: Yes   Plan:  Coumadin 7.5 mg today Daily INR  Davell Beckstead, Gary Fleet 07/17/2013,6:39 AM

## 2013-07-17 NOTE — ED Provider Notes (Signed)
CSN: [id number]     Arrival date & time 07/17/13  0357 History   First MD Initiated Contact with Patient 07/17/13 0408     Chief Complaint  Patient presents with  . Chest Pain   (Consider location/radiation/quality/duration/timing/severity/associated sxs/prior Treatment) HPI Comments: Patient is an 79 year old female with a history of atrial fibrillation, hypertension, and generalized anxiety disorder, currently on chronic Coumadin, who presents for chest pain with onset 2 hours ago. Patient states the pain awoke her from sleep and was aching and pressure-like in nature. Patient denies any radiation of her pain as well as any aggravating factors. She endorses associated nausea. Patient took 2 baby aspirin at symptom onset and was given another 2 baby aspirin as well as 2 sublingual nitroglycerin by EMS prior to arrival. Patient endorses improvement in her symptoms since onset. Patient is currently a 2/10. She denies associated lightheadedness, dizziness, syncope, fevers, cough or hemoptysis, shortness of breath, jaw pain, emesis, abdominal pain, numbness/tingling, and extremity weakness.   Cardiologist - Dr. Johney Frame. Patient has a history of cardioversion for atrial fibrillation 2011. Ejection fraction at this time was between 55 and 60%. Per patient, her target HR since this time is between 50-60 bpm.  Patient is a 79 y.o. female presenting with chest pain. The history is provided by the patient. No language interpreter was used.  Chest Pain Associated symptoms: nausea   Associated symptoms: no abdominal pain, no cough, no dizziness, no fever, no numbness, no shortness of breath, not vomiting and no weakness     Past Medical History  Diagnosis Date  . Esophageal stricture   . Hypertension   . Atrial fibrillation   . Cerebrovascular disease, unspecified   . Hyperlipidemia   . DJD (degenerative joint disease)   . Unspecified disorder of kidney and ureter   . Renal cancer   .  Osteoarthrosis, unspecified whether generalized or localized, unspecified site   . Generalized anxiety disorder   . GERD (gastroesophageal reflux disease)   . Hiatal hernia   . PN (peripheral neuropathy)     in feet  . Rectal fissure   . Gastric ulcer   . IBS (irritable bowel syndrome)   . Insomnia   . Adenomatous colon polyp 2013  . Diverticulosis   . Skin cancer of nose    Past Surgical History  Procedure Laterality Date  . Appendectomy    . Cholecystectomy    . Bunionectomies       BILATERAL  . Anterior and posterior vaginal repair  1989    AP REPAIR & RAZ URETHROPEXY  . S/p percutaneous cryoablation of right renal cell cancer  07/2009    by [name]  . Partial hysterectomy  1977   Family History  Problem Relation Age of Onset  . Heart disease Father   . Colon cancer Neg Hx   . Stroke Mother    History  Substance Use Topics  . Smoking status: Former Smoker -- 1.00 packs/day for 50 years    Types: Cigarettes    Quit date: 09/30/1988  . Smokeless tobacco: Never Used  . Alcohol Use: 1.2 oz/week    2 Glasses of wine per week     Comment: glass of wine with dinner   OB History   Grav Para Term Preterm Abortions TAB SAB Ect Mult Living                 Review of Systems  Constitutional: Negative for fever.  HENT: Negative for sore throat.  Respiratory: Negative for cough and shortness of breath.   Cardiovascular: Positive for chest pain.  Gastrointestinal: Positive for nausea. Negative for vomiting and abdominal pain.  Neurological: Negative for dizziness, syncope, weakness, light-headedness and numbness.  All other systems reviewed and are negative.    Allergies  Other; Codeine; Penicillins; and Sulfa antibiotics  Home Medications   Current Outpatient Rx  Name  Route  Sig  Dispense  Refill  . cetirizine (ZYRTEC) 10 MG tablet   Oral   Take 10 mg by mouth daily.         . Coenzyme Q10 (CO Q-10) 100 MG CAPS   Oral   Take 100 mg by mouth daily.          [name] donepezil (ARICEPT) 10 MG tablet   Oral   Take 10 mg by mouth at bedtime.         [name] ezetimibe (ZETIA) 10 MG tablet   Oral   Take 10 mg by mouth daily.         . folic acid (FOLVITE) 400 MCG tablet   Oral   Take 400 mcg by mouth daily.           [name] gabapentin (NEURONTIN) 100 MG capsule   Oral   Take 100 mg by mouth 2 (two) times daily.         . mirabegron ER (MYRBETRIQ) 25 MG TB24   Oral   Take 25 mg by mouth at bedtime.         . pravastatin (PRAVACHOL) 40 MG tablet   Oral   Take 40 mg by mouth at bedtime.         . sodium chloride (OCEAN) 0.65 % nasal spray   Nasal   Place 2 sprays into the nose as needed. For dry nose         . traMADol (ULTRAM) 50 MG tablet   Oral   Take 50 mg by mouth every 6 (six) hours as needed for pain.         [name] warfarin (COUMADIN) 5 MG tablet      Take as directed   45 tablet   6   . warfarin (COUMADIN) 5 MG tablet   Oral   Take 5 mg by mouth daily.         [name] zolpidem (AMBIEN) 10 MG tablet   Oral   Take 10 mg by mouth at bedtime as needed. For sleep         . amLODipine (NORVASC) 2.5 MG tablet   Oral   Take 2.5 mg by mouth daily.         [name] diltiazem (CARDIZEM CD) 300 MG 24 hr capsule   Oral   Take 1 capsule (300 mg total) by mouth daily.   90 capsule   1   . pyridOXINE (VITAMIN B-6) 100 MG tablet   Oral   Take 100 mg by mouth daily.         . sertraline (ZOLOFT) 25 MG tablet   Oral   Take 25 mg by mouth daily.          BP 142/62  Pulse 48  Temp(Src) 97.7 F (36.5 C) (Oral)  Resp 12  SpO2 95%  Physical Exam  Nursing note and vitals reviewed. Constitutional: She is oriented to person, place, and time. She appears well-developed and well-nourished. No distress.  HENT:  Head: Normocephalic and atraumatic.  Mouth/Throat: Oropharynx is clear and moist. No oropharyngeal exudate.  Eyes:  Conjunctivae and EOM are normal. Pupils are equal, round, and reactive to light. No scleral  icterus.  Neck: Normal range of motion. Neck supple.  Cardiovascular: Regular rhythm and normal pulses.  Bradycardia present.   Pulses:      Radial pulses are 2+ on the right side, and 2+ on the left side.  No JVD  Pulmonary/Chest: Effort normal and breath sounds normal. No respiratory distress. She has no wheezes. She has no rales.  Abdominal: Soft. She exhibits no distension. There is no tenderness. There is no rebound.  Musculoskeletal: Normal range of motion.  Neurological: She is alert and oriented to person, place, and time.  Patient moves extremities without ataxia  Skin: Skin is warm and dry. No rash noted. She is not diaphoretic. No erythema. No pallor.  Psychiatric: She has a normal mood and affect. Her behavior is normal.    ED Course  Procedures (including critical care time) Labs Review Labs Reviewed  CBC WITH DIFFERENTIAL - Abnormal; Notable for the following:    Platelets 140 (*)    All other components within normal limits  BASIC METABOLIC PANEL - Abnormal; Notable for the following:    GFR calc non Af Amer 57 (*)    GFR calc Af Amer 66 (*)    All other components within normal limits  PROTIME-INR - Abnormal; Notable for the following:    Prothrombin Time 23.6 (*)    INR 2.18 (*)    All other components within normal limits  TROPONIN I  URINALYSIS, ROUTINE W REFLEX MICROSCOPIC   Imaging Review Dg Chest 2 View  07/17/2013   CLINICAL DATA:  Shortness of breath  EXAM: CHEST  2 VIEW  COMPARISON:  Prior radiograph from 11/03/2012  FINDINGS: Cardiac and mediastinal silhouettes are stable in size and contour, and remain within normal limits.  Emphysematous changes with prominence of the interstitial markings within the perihilar regions and lower lobes is again seen, similar to prior. No new focal infiltrate. There is probable left basilar atelectasis. No pulmonary edema or pleural effusion.  Multilevel degenerative disc disease is seen within the visualized spine. There  is diffuse osteopenia. No acute osseous abnormality.  IMPRESSION: No active cardiopulmonary disease. Emphysema.   Electronically Signed   By: Rise Mu M.D.   On: 07/17/2013 05:44    Date: 07/17/2013  Rate: 54  Rhythm: sinus bradycardia  QRS Axis: normal  Intervals: QT prolonged (QTc 646  - though unreliable secondary to artifact)  ST/T Wave abnormalities: normal  Conduction Disutrbances:none  Narrative Interpretation: Sinus bradycardia; no STEMI  Old EKG Reviewed: unchanged from 03/10/2013 I have personally reviewed and interpreted this EKG   MDM   1. Chest pain    79 year old female with a history of atrial fibrillation and hypertension presents for chest pain which awoke her from sleep this evening. Patient admits to associated nausea with her symptoms. Initial workup in the ED today is unremarkable and physical exam without any significant findings. INR therapeutic. Given patient's age and symptom presentation, believe she warranted admission for chest pain rule out. Dr. Julian Reil to admit. Temp admission orders placed. Patient hemodynamically stable and chest pain-free at this time.    Antony Madura, [place] 07/17/13 [phone]

## 2013-07-19 ENCOUNTER — Other Ambulatory Visit: Payer: Self-pay | Admitting: Internal Medicine

## 2013-07-26 ENCOUNTER — Encounter: Payer: Medicare Other | Admitting: Physician Assistant

## 2013-07-28 ENCOUNTER — Ambulatory Visit (INDEPENDENT_AMBULATORY_CARE_PROVIDER_SITE_OTHER): Payer: Medicare Other | Admitting: Pharmacist

## 2013-07-28 DIAGNOSIS — Z7901 Long term (current) use of anticoagulants: Secondary | ICD-10-CM

## 2013-07-28 DIAGNOSIS — I4891 Unspecified atrial fibrillation: Secondary | ICD-10-CM

## 2013-07-28 LAB — POCT INR: INR: 2.4

## 2013-07-31 ENCOUNTER — Other Ambulatory Visit: Payer: Self-pay | Admitting: Internal Medicine

## 2013-08-03 ENCOUNTER — Ambulatory Visit (INDEPENDENT_AMBULATORY_CARE_PROVIDER_SITE_OTHER): Payer: Medicare Other | Admitting: Physician Assistant

## 2013-08-03 ENCOUNTER — Encounter: Payer: Self-pay | Admitting: Physician Assistant

## 2013-08-03 VITALS — BP 152/78 | HR 58 | Ht 65.0 in | Wt 136.0 lb

## 2013-08-03 DIAGNOSIS — R079 Chest pain, unspecified: Secondary | ICD-10-CM

## 2013-08-03 DIAGNOSIS — I4891 Unspecified atrial fibrillation: Secondary | ICD-10-CM

## 2013-08-03 DIAGNOSIS — I10 Essential (primary) hypertension: Secondary | ICD-10-CM

## 2013-08-03 DIAGNOSIS — E785 Hyperlipidemia, unspecified: Secondary | ICD-10-CM

## 2013-08-03 MED ORDER — AMIODARONE HCL 200 MG PO TABS: 100.0000 mg | ORAL_TABLET | Freq: Every day | ORAL | Status: DC

## 2013-08-03 NOTE — Progress Notes (Signed)
[address] [place], [place]  [address] Phone: [phone] Fax:  [phone]  Date:  08/03/2013   ID:  Anne Hayes, DOB [birth year], MRN [id number]  PCP:  Ginette Otto, MD  Cardiologist:  Dr. Hillis Range     History of Present Illness: Anne Hayes is a 79 y.o. female with a history of atrial fibrillation, HTN, HL, renal CA, GERD.  Echo (9/11): Mild LVH, EF 60%, normal RV function.  Past seen by Dr. Johney Frame 02/2013. She has maintained NSR on amiodarone and is on coumadin for anticoagulation.  She was recently evaluated in the emergency room 07/17/13 with chest pain. She was observed and ruled out for myocardial infarction. She was asked to follow up with cardiology as an outpatient.  She tells me that she was awoken from sleep with severe chest pain.  She denies assoc symptoms.  She thinks the pain lasted 30 mins or more.  She took a total of 4 ASA and 1 NTG with relief of symptoms.  I reviewed her hospital chart.  CEs were negative.  CXR was unremarkable for acute findings.  ECG was not acute.  She denies recurrent symptoms since she was d/c.  She is not that active.  She denies exertional CP.  No orthopnea, PND, edema.  No syncope.    Recent Labs: 03/10/2013: ALT 22; TSH 1.86  07/17/2013: Creatinine 0.89; Hemoglobin 13.2; Potassium 3.7  Chest x-ray 07/17/13: IMPRESSION: No active cardiopulmonary disease. Emphysema   Wt Readings from Last 3 Encounters:  08/03/13 136 lb (61.689 kg)  07/17/13 133 lb 1.6 oz (60.374 kg)  07/05/13 136 lb (61.689 kg)     Past Medical History  Diagnosis Date  . Esophageal stricture   . Hypertension   . Atrial fibrillation   . Cerebrovascular disease, unspecified   . Hyperlipidemia   . DJD (degenerative joint disease)   . Unspecified disorder of kidney and ureter   . Renal cancer   . Osteoarthrosis, unspecified whether generalized or localized, unspecified site   . Generalized anxiety disorder   . GERD (gastroesophageal  reflux disease)   . Hiatal hernia   . PN (peripheral neuropathy)     in feet  . Rectal fissure   . Gastric ulcer   . IBS (irritable bowel syndrome)   . Insomnia   . Adenomatous colon polyp 2013  . Diverticulosis   . Skin cancer of nose     Current Outpatient Prescriptions  Medication Sig Dispense Refill  . amiodarone (PACERONE) 200 MG tablet Take 0.5 tablets (100 mg total) by mouth daily.      [name] aspirin 81 MG chewable tablet Chew 81 mg by mouth daily.      [name] BIOTIN PO Take 1 tablet by mouth daily.      . cetirizine (ZYRTEC) 10 MG tablet Take 10 mg by mouth daily.      . Cholecalciferol (VITAMIN D PO) Take 1 tablet by mouth daily.      . Coenzyme Q10 (CO Q-10) 100 MG CAPS Take 100 mg by mouth daily.      [name] diltiazem (CARDIZEM CD) 300 MG 24 hr capsule TAKE ONE CAPSULE BY MOUTH ONCE DAILY  90 capsule  0  . donepezil (ARICEPT) 10 MG tablet Take 10 mg by mouth at bedtime.      . folic acid (FOLVITE) 400 MCG tablet Take 400 mcg by mouth daily.        [name] gabapentin (NEURONTIN) 100 MG capsule Take 100  mg by mouth 2 (two) times daily.      . Omega-3 Fatty Acids (FISH OIL PO) Take 1 tablet by mouth daily.      [name] omeprazole (PRILOSEC) 40 MG capsule Take 1 capsule (40 mg total) by mouth daily.  30 capsule  0  . pravastatin (PRAVACHOL) 40 MG tablet Take 40 mg by mouth at bedtime.      . pyridOXINE (VITAMIN B-6) 100 MG tablet Take 100 mg by mouth daily.      . sodium chloride (OCEAN) 0.65 % nasal spray Place 2 sprays into the nose as needed. For dry nose      . warfarin (COUMADIN) 5 MG tablet Take as directed  45 tablet  6  . warfarin (COUMADIN) 5 MG tablet Take 5-7.5 mg by mouth daily. Takes 5 mg daily, except Wednesday and Saturday, then takes 7.5mg       . zolpidem (AMBIEN) 10 MG tablet Take 10 mg by mouth at bedtime as needed. For sleep      . amLODipine (NORVASC) 2.5 MG tablet Take 2.5 mg by mouth daily.      [name] ezetimibe (ZETIA) 10 MG tablet Take 10 mg by mouth daily.      . mirabegron ER  (MYRBETRIQ) 25 MG TB24 Take 25 mg by mouth at bedtime.       No current facility-administered medications for this visit.    Allergies:   Other; Codeine; Penicillins; and Sulfa antibiotics   Social History:  The patient  reports that she quit smoking about 24 years ago. Her smoking use included Cigarettes. She has a 50 pack-year smoking history. She has never used smokeless tobacco. She reports that she drinks about 1.2 ounces of alcohol per week. She reports that she does not use illicit drugs.   Family History:  The patient's family history includes Heart disease in her father; Stroke in her mother. There is no history of Colon cancer.   ROS:  Please see the history of present illness.   She has a non-productive cough.  She denies dysphagia, odynophagia.  No belching or H2O brash symptoms.  No pleuritic CP.  No CP with palpation.   All other systems reviewed and negative.   PHYSICAL EXAM: VS:  BP 152/78  Pulse 58  Ht 5\' 5"  (1.651 m)  Wt 136 lb (61.689 kg)  BMI 22.63 kg/m2 Well nourished, well developed, in no acute distress HEENT: normal Neck: no JVD Cardiac:  normal S1, S2; RRR; no murmur Lungs:  clear to auscultation bilaterally, no wheezing, rhonchi or rales Abd: soft, nontender, no hepatomegaly Ext: no edema Skin: warm and dry Neuro:  CNs 2-12 intact, no focal abnormalities noted  EKG:  Sinus bradycardia, HR 58, LAD, nonspecific ST-T wave changes     ASSESSMENT AND PLAN:  1. Chest Pain:  She has atypical features.  However, she has significant cardiac risk factors and her symptoms were quite severe.  We had a long discussion regarding whether or not to pursue stress testing.  She would like to undergo stress testing.  She cannot walk on a treadmill due to DJD.  Schedule Lexiscan Myoview. 2. Atrial Fibrillation:  Maintaining NSR.  Continue Amiodarone and Coumadin.  Arrange f/u Lipids and LFTs.  3. Hypertension:  Fair control. Continue current Rx. 4. Hyperlipidemia:  Continue  current Rx.  5. Disposition:  F/u with Dr. Hillis Range in 6 mos or sooner if myoview significantly abnormal.    Signed, Tereso Newcomer, PA-C  08/03/2013 12:57 PM

## 2013-08-03 NOTE — Patient Instructions (Signed)
LABS TSH, LFT WHEN YOU HAVE YOU MYOVIEW LEXISCAN  PLEASE SCHEDULE TO HAVE A LEXISCAN  Your physician wants you to follow-up in: 6 MONTHS WITH DR. ALLRED. You will receive a reminder letter in the mail two months in advance. If you don't receive a letter, please call our office to schedule the follow-up appointment.

## 2013-08-05 ENCOUNTER — Other Ambulatory Visit: Payer: Self-pay | Admitting: Pulmonary Disease

## 2013-08-05 ENCOUNTER — Other Ambulatory Visit (HOSPITAL_COMMUNITY): Payer: Self-pay | Admitting: Interventional Radiology

## 2013-08-05 DIAGNOSIS — C641 Malignant neoplasm of right kidney, except renal pelvis: Secondary | ICD-10-CM

## 2013-08-18 ENCOUNTER — Ambulatory Visit (INDEPENDENT_AMBULATORY_CARE_PROVIDER_SITE_OTHER): Payer: Medicare Other | Admitting: Pharmacist

## 2013-08-18 ENCOUNTER — Other Ambulatory Visit: Payer: Medicare Other

## 2013-08-18 ENCOUNTER — Encounter: Payer: Self-pay | Admitting: Cardiology

## 2013-08-18 DIAGNOSIS — C649 Malignant neoplasm of unspecified kidney, except renal pelvis: Secondary | ICD-10-CM

## 2013-08-18 DIAGNOSIS — I4891 Unspecified atrial fibrillation: Secondary | ICD-10-CM

## 2013-08-18 DIAGNOSIS — Z7901 Long term (current) use of anticoagulants: Secondary | ICD-10-CM

## 2013-08-18 LAB — BASIC METABOLIC PANEL
BUN: 13 mg/dL (ref 6–23)
Chloride: 105 mEq/L (ref 96–112)
Creat: 0.93 mg/dL (ref 0.50–1.10)
Glucose, Bld: 92 mg/dL (ref 70–99)

## 2013-08-19 ENCOUNTER — Ambulatory Visit (HOSPITAL_COMMUNITY): Payer: Medicare Other | Attending: Physician Assistant | Admitting: Radiology

## 2013-08-19 ENCOUNTER — Other Ambulatory Visit: Payer: Medicare Other

## 2013-08-19 VITALS — BP 136/78 | HR 54 | Ht 65.0 in | Wt 135.0 lb

## 2013-08-19 DIAGNOSIS — Z8249 Family history of ischemic heart disease and other diseases of the circulatory system: Secondary | ICD-10-CM | POA: Insufficient documentation

## 2013-08-19 DIAGNOSIS — Z8673 Personal history of transient ischemic attack (TIA), and cerebral infarction without residual deficits: Secondary | ICD-10-CM | POA: Insufficient documentation

## 2013-08-19 DIAGNOSIS — E785 Hyperlipidemia, unspecified: Secondary | ICD-10-CM | POA: Insufficient documentation

## 2013-08-19 DIAGNOSIS — R0789 Other chest pain: Secondary | ICD-10-CM | POA: Insufficient documentation

## 2013-08-19 DIAGNOSIS — R079 Chest pain, unspecified: Secondary | ICD-10-CM

## 2013-08-19 DIAGNOSIS — Z87891 Personal history of nicotine dependence: Secondary | ICD-10-CM | POA: Insufficient documentation

## 2013-08-19 DIAGNOSIS — I10 Essential (primary) hypertension: Secondary | ICD-10-CM | POA: Insufficient documentation

## 2013-08-19 DIAGNOSIS — I4891 Unspecified atrial fibrillation: Secondary | ICD-10-CM | POA: Insufficient documentation

## 2013-08-19 MED ORDER — TECHNETIUM TC 99M SESTAMIBI GENERIC - CARDIOLITE
33.0000 | Freq: Once | INTRAVENOUS | Status: AC | PRN
  Administered 2013-08-19: 33 via INTRAVENOUS

## 2013-08-19 MED ORDER — REGADENOSON 0.4 MG/5ML IV SOLN
0.4000 mg | Freq: Once | INTRAVENOUS | Status: AC
  Administered 2013-08-19: 0.4 mg via INTRAVENOUS

## 2013-08-19 MED ORDER — TECHNETIUM TC 99M SESTAMIBI GENERIC - CARDIOLITE
11.0000 | Freq: Once | INTRAVENOUS | Status: AC | PRN
  Administered 2013-08-19: 11 via INTRAVENOUS

## 2013-08-19 NOTE — Progress Notes (Signed)
[facility] SITE 3 NUCLEAR MED [address] [place], [place] [address] [phone]    Cardiology Nuclear Med Study  Anne Hayes is a 79 y.o. female     MRN : [id number]     DOB: [birth year]  Procedure Date: 08/19/2013  Nuclear Med Background Indication for Stress Test:  Evaluation for Ischemia and Surgical Clearance History:  No known CAD, Echo EF 55-60%, MPI 2007 (normal) EF 72%, Atrial Fibrillation Cardiac Risk Factors: Family History - CAD, History of Smoking, Hypertension, Lipids and TIA  Symptoms:  Chest Pain (last date of chest discomfort was 2-3 weeks ago)   Nuclear Pre-Procedure Caffeine/Decaff Intake:  None NPO After: 7:00pm   Lungs:  clear O2 Sat: 94% on room air. IV 0.9% NS with Angio Cath:  22g  IV Site: R Hand  IV Started by:  Cathlyn Parsons, RN  Chest Size (in):  36 Cup Size: C  Height: 5\' 5"  (1.651 m)  Weight:  135 lb (61.236 kg)  BMI:  Body mass index is 22.47 kg/(m^2). Tech Comments:  n/a    Nuclear Med Study 1 or 2 day study: 1 day  Stress Test Type:  Lexiscan  Reading MD: Charlton Haws, MD  Order Authorizing Provider:  Jarold Song  Resting Radionuclide: Technetium 50m Sestamibi  Resting Radionuclide Dose: 11.0 mCi   Stress Radionuclide:  Technetium 80m Sestamibi  Stress Radionuclide Dose: 32.8 mCi           Stress Protocol Rest HR: 54 Stress HR: 59  Rest BP: 136/78 Stress BP: 143/74  Exercise Time (min): n/a METS: n/a   Predicted Max HR: 134 bpm % Max HR: 44.03 bpm Rate Pressure Product: 8437   Dose of Adenosine (mg):  n/a Dose of Lexiscan: 0.4 mg  Dose of Atropine (mg): n/a Dose of Dobutamine: n/a mcg/kg/min (at max HR)  Stress Test Technologist: Nelson Chimes, BS-ES  Nuclear Technologist:  Doyne Keel, CNMT     Rest Procedure:  Myocardial perfusion imaging was performed at rest 45 minutes following the intravenous administration of Technetium 4m Sestamibi. Rest ECG: NSR with non-specific ST-T wave changes  Stress  Procedure:  The patient received IV Lexiscan 0.4 mg over 15-seconds.  Technetium 2m Sestamibi injected at 30-seconds.  Quantitative spect images were obtained after a 45 minute delay.  During the infusion of Lexiscan, the patient did not report any symptoms.  Stress ECG: No significant change from baseline ECG  QPS Raw Data Images:  Normal; no motion artifact; normal heart/lung ratio. Stress Images:  Normal homogeneous uptake in all areas of the myocardium. Rest Images:  Normal homogeneous uptake in all areas of the myocardium. Subtraction (SDS):  Normal Transient Ischemic Dilatation (Normal <1.22):  0.98 Lung/Heart Ratio (Normal <0.45):  0.36  Quantitative Gated Spect Images QGS EDV:  76 ml QGS ESV:  24 ml  Impression Exercise Capacity:  Lexiscan with no exercise. BP Response:  Normal blood pressure response. Clinical Symptoms:  No significant symptoms noted. ECG Impression:  No significant ST segment change suggestive of ischemia. Comparison with Prior Nuclear Study: No images to compare  Overall Impression:  Normal stress nuclear study.  LV Ejection Fraction: 69%.  LV Wall Motion:  NL LV Function; NL Wall Motion  Charlton Haws

## 2013-08-20 ENCOUNTER — Encounter: Payer: Self-pay | Admitting: Physician Assistant

## 2013-09-04 ENCOUNTER — Other Ambulatory Visit: Payer: Self-pay | Admitting: Internal Medicine

## 2013-09-07 ENCOUNTER — Ambulatory Visit (HOSPITAL_COMMUNITY): Payer: Medicare Other

## 2013-09-07 ENCOUNTER — Ambulatory Visit
Admission: RE | Admit: 2013-09-07 | Discharge: 2013-09-07 | Disposition: A | Discharge status: Home / Self Care | Payer: Medicare Other | Source: Ambulatory Visit | Attending: Interventional Radiology | Admitting: Interventional Radiology

## 2013-09-07 ENCOUNTER — Other Ambulatory Visit: Payer: Self-pay

## 2013-09-07 DIAGNOSIS — C641 Malignant neoplasm of right kidney, except renal pelvis: Secondary | ICD-10-CM

## 2013-09-09 ENCOUNTER — Ambulatory Visit (HOSPITAL_COMMUNITY)
Admission: RE | Admit: 2013-09-09 | Discharge: 2013-09-09 | Disposition: A | Discharge status: Home / Self Care | Payer: Medicare Other | Source: Ambulatory Visit | Attending: Interventional Radiology | Admitting: Interventional Radiology

## 2013-09-09 DIAGNOSIS — R911 Solitary pulmonary nodule: Secondary | ICD-10-CM | POA: Insufficient documentation

## 2013-09-09 DIAGNOSIS — N281 Cyst of kidney, acquired: Secondary | ICD-10-CM | POA: Insufficient documentation

## 2013-09-09 DIAGNOSIS — C649 Malignant neoplasm of unspecified kidney, except renal pelvis: Secondary | ICD-10-CM | POA: Insufficient documentation

## 2013-09-09 DIAGNOSIS — C641 Malignant neoplasm of right kidney, except renal pelvis: Secondary | ICD-10-CM

## 2013-09-09 MED ORDER — IOHEXOL 300 MG/ML  SOLN
100.0000 mL | Freq: Once | INTRAMUSCULAR | Status: AC | PRN
  Administered 2013-09-09: 100 mL via INTRAVENOUS

## 2013-09-13 ENCOUNTER — Telehealth: Payer: Self-pay | Admitting: Radiology

## 2013-09-13 NOTE — Telephone Encounter (Signed)
Reminder call for app't on 09/14/2013 at 10am.  Arrive at 9:45 am.  Amado Nash, RN 09/13/2013 3:08 PM

## 2013-09-14 ENCOUNTER — Ambulatory Visit
Admission: RE | Admit: 2013-09-14 | Discharge: 2013-09-14 | Disposition: A | Discharge status: Home / Self Care | Payer: Medicare Other | Source: Ambulatory Visit | Attending: Interventional Radiology | Admitting: Interventional Radiology

## 2013-09-14 NOTE — Progress Notes (Signed)
Denies hematuria or any problems with urination.  Denies discomfort associated with RFA.  Overall doing well.  Naelle Diegel Carmell Austria, RN 09/14/2013 10:11 AM

## 2013-09-15 ENCOUNTER — Other Ambulatory Visit (HOSPITAL_COMMUNITY): Payer: Self-pay | Admitting: Interventional Radiology

## 2013-09-15 ENCOUNTER — Other Ambulatory Visit: Payer: Self-pay | Admitting: Internal Medicine

## 2013-09-15 DIAGNOSIS — R911 Solitary pulmonary nodule: Secondary | ICD-10-CM

## 2013-09-16 ENCOUNTER — Ambulatory Visit (INDEPENDENT_AMBULATORY_CARE_PROVIDER_SITE_OTHER): Payer: Medicare Other | Admitting: Pharmacist

## 2013-09-16 DIAGNOSIS — I4891 Unspecified atrial fibrillation: Secondary | ICD-10-CM

## 2013-09-16 DIAGNOSIS — Z7901 Long term (current) use of anticoagulants: Secondary | ICD-10-CM

## 2013-09-20 ENCOUNTER — Ambulatory Visit (HOSPITAL_COMMUNITY)
Admission: RE | Admit: 2013-09-20 | Discharge: 2013-09-20 | Disposition: A | Discharge status: Home / Self Care | Payer: Medicare Other | Source: Ambulatory Visit | Attending: Interventional Radiology | Admitting: Interventional Radiology

## 2013-09-20 DIAGNOSIS — R911 Solitary pulmonary nodule: Secondary | ICD-10-CM | POA: Insufficient documentation

## 2013-09-20 DIAGNOSIS — I700 Atherosclerosis of aorta: Secondary | ICD-10-CM | POA: Insufficient documentation

## 2013-09-20 DIAGNOSIS — K838 Other specified diseases of biliary tract: Secondary | ICD-10-CM | POA: Insufficient documentation

## 2013-09-20 MED ORDER — IOHEXOL 300 MG/ML  SOLN
80.0000 mL | Freq: Once | INTRAMUSCULAR | Status: AC | PRN
  Administered 2013-09-20: 80 mL via INTRAVENOUS

## 2013-09-28 ENCOUNTER — Telehealth: Payer: Self-pay | Admitting: *Deleted

## 2013-09-28 NOTE — Telephone Encounter (Signed)
Message copied by Barrie Folk on Tue Sep 28, 2013 11:14 AM ------      Message from: Valera Castle C      Created: Wed Sep 22, 2013 10:02 AM             Make sure someone follows up on this nodule......question biopsy? ------

## 2013-09-28 NOTE — Telephone Encounter (Signed)
I spoke with Anne Hayes in radiology as it appears follow-up CT was ordered by Dr. Fredia Sorrow after pt had IR radiology eval on 09/15/13.  (see imaging note 09/15/13)  I told her I was not sure if pt had received results of the CT & to whom the results should have been sent to in order to call pt.  She will follow-up with their PA at [facility] & call back. Mylo Red RN

## 2013-10-22 ENCOUNTER — Other Ambulatory Visit: Payer: Self-pay | Admitting: Pulmonary Disease

## 2013-10-22 DIAGNOSIS — J8410 Pulmonary fibrosis, unspecified: Secondary | ICD-10-CM

## 2013-10-23 ENCOUNTER — Ambulatory Visit: Payer: Medicare Other

## 2013-10-23 ENCOUNTER — Ambulatory Visit (INDEPENDENT_AMBULATORY_CARE_PROVIDER_SITE_OTHER): Payer: Medicare Other | Admitting: Emergency Medicine

## 2013-10-23 VITALS — BP 130/78 | HR 53 | Temp 97.6°F | Resp 16 | Ht 66.0 in | Wt 134.0 lb

## 2013-10-23 DIAGNOSIS — M25571 Pain in right ankle and joints of right foot: Secondary | ICD-10-CM

## 2013-10-23 DIAGNOSIS — S8263XA Displaced fracture of lateral malleolus of unspecified fibula, initial encounter for closed fracture: Secondary | ICD-10-CM

## 2013-10-23 DIAGNOSIS — M25579 Pain in unspecified ankle and joints of unspecified foot: Secondary | ICD-10-CM

## 2013-10-23 DIAGNOSIS — S335XXA Sprain of ligaments of lumbar spine, initial encounter: Secondary | ICD-10-CM

## 2013-10-23 MED ORDER — HYDROCODONE-ACETAMINOPHEN 5-325 MG PO TABS: 1.0000 | ORAL_TABLET | ORAL | Status: DC | PRN

## 2013-10-23 MED ORDER — ACETAMINOPHEN 325 MG PO TABS
1000.0000 mg | ORAL_TABLET | Freq: Once | ORAL | Status: AC
  Administered 2013-10-23: 1000 mg via ORAL

## 2013-10-23 NOTE — Patient Instructions (Signed)
°  Ankle Fracture °A fracture is a break in the bone. A cast or splint is used to protect and keep your injured bone from moving.  °HOME CARE INSTRUCTIONS  °· Use your crutches as directed. °· To lessen the swelling, keep the injured leg elevated while sitting or lying down. °· Apply ice to the injury for 15-20 minutes, 03-04 times per day while awake for 2 days. Put the ice in a plastic bag and place a thin towel between the bag of ice and your cast. °· If you have a plaster or fiberglass cast: °· Do not try to scratch the skin under the cast using sharp or pointed objects. °· Check the skin around the cast every day. You may put lotion on any red or sore areas. °· Keep your cast dry and clean. °· If you have a plaster splint: °· Wear the splint as directed. °· You may loosen the elastic around the splint if your toes become numb, tingle, or turn cold or blue. °· Do not put pressure on any part of your cast or splint; it may break. Rest your cast only on a pillow the first 24 hours until it is fully hardened. °· Your cast or splint can be protected during bathing with a plastic bag. Do not lower the cast or splint into water. °· Take medications as directed by your caregiver. Only take over-the-counter or prescription medicines for pain, discomfort, or fever as directed by your caregiver. °· Do not drive a vehicle until your caregiver specifically tells you it is safe to do so. °· If your caregiver has given you a follow-up appointment, it is very important to keep that appointment. Not keeping the appointment could result in a chronic or permanent injury, pain, and disability. If there is any problem keeping the appointment, you must call back to this facility for assistance. °SEEK IMMEDIATE MEDICAL CARE IF:  °· Your cast gets damaged or breaks. °· You have continued severe pain or more swelling than you did before the cast was put on. °· Your skin or toenails below the injury turn blue or gray, or feel cold or  numb. °· There is a bad smell or new stains and/or purulent (pus like) drainage coming from under the cast. °If you do not have a window in your cast for observing the wound, a discharge or minor bleeding may show up as a stain on the outside of your cast. Report these findings to your caregiver. °MAKE SURE YOU:  °· Understand these instructions. °· Will watch your condition. °· Will get help right away if you are not doing well or get worse. °Document Released: 09/13/2000 Document Revised: 12/09/2011 Document Reviewed: 04/15/2013 °[employer]® Patient Information ©2014 [employer]. ° ° °

## 2013-10-23 NOTE — Progress Notes (Signed)
[facility] [address] [place] [address] [phone]  Date:  10/23/2013   Name:  Anne Hayes   DOB:  Feb 14, 1924   MRN:  [id number]  PCP:  Mathews Argyle, MD    Chief Complaint: Fall and Ankle Pain   History of Present Illness:  Anne Hayes is a 80 y.o. very pleasant female patient who presents with the following:  Stepping down from a curb this morning and fell.  Did NOT lose consciousness, have dizziness, chest pain, visual or neuro symptoms.  She turned her ankle and has pain in lateral ankle with moderate swelling.  No improvement with over the counter medications or other home remedies. Denies other complaint or health concern today.   Patient Active Problem List   Diagnosis Date Noted  . Chest pain 07/17/2013  . Onychomycosis 04/13/2013  . Incontinence 08/14/2012  . Closed fracture of 5th metacarpal 02/21/2012  . Memory loss 08/04/2011  . Encounter for long-term (current) use of anticoagulants 06/20/2011  . Pulmonary interstitial fibrosis 05/14/2011  . SINUS BRADYCARDIA 09/07/2010  . RENAL CELL CANCER 11/15/2009  . HOARSENESS 11/15/2009  . CEREBROVASCULAR DISEASE 09/03/2008  . COUGH 09/03/2008  . CONSTIPATION 09/02/2008  . UNSPECIFIED DISORDER OF KIDNEY AND URETER 09/02/2008  . HYPERCHOLESTEROLEMIA 07/29/2007  . ANXIETY DISORDER, GENERALIZED 07/29/2007  . HYPERTENSION 07/29/2007  . Atrial fibrillation 07/29/2007  . GERD 07/29/2007  . DEGENERATIVE JOINT DISEASE 07/29/2007    Past Medical History  Diagnosis Date  . Esophageal stricture   . Hypertension   . Atrial fibrillation   . Cerebrovascular disease, unspecified   . Hyperlipidemia   . DJD (degenerative joint disease)   . Unspecified disorder of kidney and ureter   . Renal cancer   . Osteoarthrosis, unspecified whether generalized or localized, unspecified site   . Generalized anxiety disorder   . GERD (gastroesophageal reflux disease)   . Hiatal hernia   . PN  (peripheral neuropathy)     in feet  . Rectal fissure   . Gastric ulcer   . IBS (irritable bowel syndrome)   . Insomnia   . Adenomatous colon polyp 2013  . Diverticulosis   . Skin cancer of nose   . Hx of cardiovascular stress test     Lexiscan Myoview (07/2013): No ischemia, EF 69%, normal study    Past Surgical History  Procedure Laterality Date  . Appendectomy    . Cholecystectomy    . Bunionectomies       BILATERAL  . Anterior and posterior vaginal repair  1989    AP REPAIR & RAZ URETHROPEXY  . S/p percutaneous cryoablation of right renal cell cancer  07/2009    by [name]  . Partial hysterectomy  1977    History  Substance Use Topics  . Smoking status: Former Smoker -- 1.00 packs/day for 50 years    Types: Cigarettes    Quit date: 09/30/1988  . Smokeless tobacco: Never Used  . Alcohol Use: 1.2 oz/week    2 Glasses of wine per week     Comment: glass of wine with dinner    Family History  Problem Relation Age of Onset  . Heart disease Father   . Colon cancer Neg Hx   . Stroke Mother     Allergies  Allergen Reactions  . Other Nausea And Vomiting    Any kind of BELL PEPPER.   Extreme, violent nausea and vomiting quickly leading to dehydration  . Codeine Nausea And Vomiting       .  Penicillins Nausea And Vomiting  . Sulfa Antibiotics Itching    Medication list has been reviewed and updated.  Current Outpatient Prescriptions on File Prior to Visit  Medication Sig Dispense Refill  . amiodarone (PACERONE) 200 MG tablet TAKE ONE TABLET BY MOUTH ONCE DAILY  30 tablet  0  . amLODipine (NORVASC) 2.5 MG tablet Take 2.5 mg by mouth daily.      [name] aspirin 81 MG chewable tablet Chew 81 mg by mouth daily.      [name] BIOTIN PO Take 1 tablet by mouth daily.      . Cholecalciferol (VITAMIN D PO) Take 1 tablet by mouth daily.      . Coenzyme Q10 (CO Q-10) 100 MG CAPS Take 100 mg by mouth daily.      [name] diltiazem (CARDIZEM CD) 300 MG 24 hr capsule TAKE ONE CAPSULE BY  MOUTH ONCE DAILY  90 capsule  0  . donepezil (ARICEPT) 5 MG tablet TAKE ONE TABLET BY MOUTH AT BEDTIME  30 tablet  6  . folic acid (FOLVITE) 557 MCG tablet Take 400 mcg by mouth daily.        [name] gabapentin (NEURONTIN) 100 MG capsule Take 100 mg by mouth 2 (two) times daily.      . Omega-3 Fatty Acids (FISH OIL PO) Take 1 tablet by mouth daily.      . sodium chloride (OCEAN) 0.65 % nasal spray Place 2 sprays into the nose as needed. For dry nose      . warfarin (COUMADIN) 5 MG tablet Take as directed  45 tablet  6  . warfarin (COUMADIN) 5 MG tablet Take 5-7.5 mg by mouth daily. Takes 5 mg daily, except Wednesday and Saturday, then takes 7.5mg       . zolpidem (AMBIEN) 10 MG tablet Take 10 mg by mouth at bedtime as needed. For sleep      . cetirizine (ZYRTEC) 10 MG tablet Take 10 mg by mouth daily.      [name] donepezil (ARICEPT) 10 MG tablet Take 10 mg by mouth at bedtime.      [name] ezetimibe (ZETIA) 10 MG tablet Take 10 mg by mouth daily.      . mirabegron ER (MYRBETRIQ) 25 MG TB24 Take 25 mg by mouth at bedtime.      [name] omeprazole (PRILOSEC) 40 MG capsule Take 1 capsule (40 mg total) by mouth daily.  30 capsule  0  . pravastatin (PRAVACHOL) 40 MG tablet Take 40 mg by mouth at bedtime.      . pyridOXINE (VITAMIN B-6) 100 MG tablet Take 100 mg by mouth daily.       No current facility-administered medications on file prior to visit.    Review of Systems:  As per HPI, otherwise negative.    Physical Examination: Filed Vitals:   10/23/13 1523  BP: 130/78  Pulse: 53  Temp: 97.6 F (36.4 C)  Resp: 16   Filed Vitals:   10/23/13 1523  Height: 5\' 6"  (1.676 m)  Weight: 134 lb (60.782 kg)   Body mass index is 21.64 kg/(m^2). Ideal Body Weight: Weight in (lb) to have BMI = 25: 154.6   GEN: WDWN, NAD, Non-toxic, Alert & Oriented x 3 HEENT: Atraumatic, Normocephalic.  Ears and Nose: No external deformity. EXTR: No clubbing/cyanosis/edema NEURO: wheelchair bound.  PSYCH: Normally interactive.  Conversant. Not depressed or anxious appearing.  Calm demeanor.  Right ankle:  Tender swollen and guards with large effusion lateral malleolus. Unable to bear weight.  Assessment and Plan: Fracture lateral malleolus Ortho Boot (can't use crutches) Signed,  Ellison Carwin, MD   [facility] reading (PRIMARY) by  Dr. Ouida Sills.  Fracture lateral malleolus.

## 2013-10-25 ENCOUNTER — Encounter: Payer: Self-pay | Admitting: Internal Medicine

## 2013-10-28 ENCOUNTER — Encounter (HOSPITAL_COMMUNITY): Payer: Self-pay | Admitting: Emergency Medicine

## 2013-10-28 ENCOUNTER — Emergency Department (HOSPITAL_COMMUNITY): Payer: Medicare Other

## 2013-10-28 ENCOUNTER — Inpatient Hospital Stay (HOSPITAL_COMMUNITY)
Admission: EM | Admit: 2013-10-28 | Discharge: 2013-10-31 | DRG: 690 | Disposition: A | Discharge status: Skilled Nursing Facility | Payer: Medicare Other | Attending: Internal Medicine | Admitting: Internal Medicine

## 2013-10-28 DIAGNOSIS — Z7982 Long term (current) use of aspirin: Secondary | ICD-10-CM

## 2013-10-28 DIAGNOSIS — S82409A Unspecified fracture of shaft of unspecified fibula, initial encounter for closed fracture: Secondary | ICD-10-CM | POA: Diagnosis present

## 2013-10-28 DIAGNOSIS — Z9089 Acquired absence of other organs: Secondary | ICD-10-CM

## 2013-10-28 DIAGNOSIS — Z8249 Family history of ischemic heart disease and other diseases of the circulatory system: Secondary | ICD-10-CM

## 2013-10-28 DIAGNOSIS — R413 Other amnesia: Secondary | ICD-10-CM | POA: Diagnosis present

## 2013-10-28 DIAGNOSIS — K219 Gastro-esophageal reflux disease without esophagitis: Secondary | ICD-10-CM | POA: Diagnosis present

## 2013-10-28 DIAGNOSIS — E860 Dehydration: Secondary | ICD-10-CM

## 2013-10-28 DIAGNOSIS — Z85828 Personal history of other malignant neoplasm of skin: Secondary | ICD-10-CM

## 2013-10-28 DIAGNOSIS — R5383 Other fatigue: Secondary | ICD-10-CM

## 2013-10-28 DIAGNOSIS — Z85528 Personal history of other malignant neoplasm of kidney: Secondary | ICD-10-CM

## 2013-10-28 DIAGNOSIS — R112 Nausea with vomiting, unspecified: Secondary | ICD-10-CM

## 2013-10-28 DIAGNOSIS — N390 Urinary tract infection, site not specified: Principal | ICD-10-CM

## 2013-10-28 DIAGNOSIS — A498 Other bacterial infections of unspecified site: Secondary | ICD-10-CM | POA: Diagnosis present

## 2013-10-28 DIAGNOSIS — A088 Other specified intestinal infections: Secondary | ICD-10-CM | POA: Diagnosis present

## 2013-10-28 DIAGNOSIS — I10 Essential (primary) hypertension: Secondary | ICD-10-CM | POA: Diagnosis present

## 2013-10-28 DIAGNOSIS — E785 Hyperlipidemia, unspecified: Secondary | ICD-10-CM | POA: Diagnosis present

## 2013-10-28 DIAGNOSIS — R5381 Other malaise: Secondary | ICD-10-CM | POA: Diagnosis present

## 2013-10-28 DIAGNOSIS — R197 Diarrhea, unspecified: Secondary | ICD-10-CM | POA: Diagnosis present

## 2013-10-28 DIAGNOSIS — Z87891 Personal history of nicotine dependence: Secondary | ICD-10-CM

## 2013-10-28 DIAGNOSIS — R531 Weakness: Secondary | ICD-10-CM

## 2013-10-28 DIAGNOSIS — IMO0001 Reserved for inherently not codable concepts without codable children: Secondary | ICD-10-CM

## 2013-10-28 DIAGNOSIS — I4891 Unspecified atrial fibrillation: Secondary | ICD-10-CM

## 2013-10-28 DIAGNOSIS — Z823 Family history of stroke: Secondary | ICD-10-CM

## 2013-10-28 DIAGNOSIS — Z7901 Long term (current) use of anticoagulants: Secondary | ICD-10-CM

## 2013-10-28 DIAGNOSIS — I679 Cerebrovascular disease, unspecified: Secondary | ICD-10-CM

## 2013-10-28 DIAGNOSIS — R350 Frequency of micturition: Secondary | ICD-10-CM | POA: Diagnosis present

## 2013-10-28 HISTORY — DX: Other specified postprocedural states: Z98.890

## 2013-10-28 HISTORY — DX: Nausea with vomiting, unspecified: R11.2

## 2013-10-28 LAB — CBC
HEMATOCRIT: 40.3 % (ref 36.0–46.0)
HEMOGLOBIN: 13.5 g/dL (ref 12.0–15.0)
MCH: 32.3 pg (ref 26.0–34.0)
MCHC: 33.5 g/dL (ref 30.0–36.0)
MCV: 96.4 fL (ref 78.0–100.0)
Platelets: 163 10*3/uL (ref 150–400)
RBC: 4.18 MIL/uL (ref 3.87–5.11)
RDW: 13 % (ref 11.5–15.5)
WBC: 11.6 10*3/uL — AB (ref 4.0–10.5)

## 2013-10-28 LAB — URINALYSIS, ROUTINE W REFLEX MICROSCOPIC
Bilirubin Urine: NEGATIVE
Glucose, UA: NEGATIVE mg/dL
KETONES UR: NEGATIVE mg/dL
Nitrite: POSITIVE — AB
PH: 6.5 (ref 5.0–8.0)
Protein, ur: NEGATIVE mg/dL
SPECIFIC GRAVITY, URINE: 1.021 (ref 1.005–1.030)
Urobilinogen, UA: 1 mg/dL (ref 0.0–1.0)

## 2013-10-28 LAB — URINE MICROSCOPIC-ADD ON

## 2013-10-28 LAB — PROTIME-INR
INR: 1.94 — ABNORMAL HIGH (ref 0.00–1.49)
PROTHROMBIN TIME: 21.6 s — AB (ref 11.6–15.2)

## 2013-10-28 LAB — TROPONIN I: Troponin I: <0.3 ng/mL (ref <0.30)

## 2013-10-28 LAB — COMPREHENSIVE METABOLIC PANEL
ALBUMIN: 3.6 g/dL (ref 3.5–5.2)
ALK PHOS: 67 U/L (ref 39–117)
ALT: 34 U/L (ref 0–35)
AST: 27 U/L (ref 0–37)
BUN: 25 mg/dL — ABNORMAL HIGH (ref 6–23)
CHLORIDE: 104 meq/L (ref 96–112)
CO2: 21 mEq/L (ref 19–32)
Calcium: 8.9 mg/dL (ref 8.4–10.5)
Creatinine, Ser: 0.84 mg/dL (ref 0.50–1.10)
GFR calc Af Amer: 70 mL/min — ABNORMAL LOW (ref >90)
GFR calc non Af Amer: 61 mL/min — ABNORMAL LOW (ref >90)
GLUCOSE: 129 mg/dL — AB (ref 70–99)
POTASSIUM: 4.2 meq/L (ref 3.7–5.3)
Sodium: 141 mEq/L (ref 137–147)
Total Bilirubin: 0.7 mg/dL (ref 0.3–1.2)
Total Protein: 7.1 g/dL (ref 6.0–8.3)

## 2013-10-28 MED ORDER — ONDANSETRON HCL 4 MG/2ML IJ SOLN
4.0000 mg | Freq: Once | INTRAMUSCULAR | Status: AC
  Administered 2013-10-28: 4 mg via INTRAVENOUS
  Filled 2013-10-28: qty 2

## 2013-10-28 MED ORDER — EZETIMIBE 10 MG PO TABS
10.0000 mg | ORAL_TABLET | Freq: Every day | ORAL | Status: DC
  Administered 2013-10-29 – 2013-10-31 (×3): 10 mg via ORAL
  Filled 2013-10-28 (×4): qty 1

## 2013-10-28 MED ORDER — ZOLPIDEM TARTRATE 5 MG PO TABS
5.0000 mg | ORAL_TABLET | Freq: Every evening | ORAL | Status: DC | PRN
  Administered 2013-10-28 – 2013-10-29 (×2): 5 mg via ORAL
  Filled 2013-10-28 (×2): qty 1

## 2013-10-28 MED ORDER — DILTIAZEM HCL ER COATED BEADS 300 MG PO CP24
300.0000 mg | ORAL_CAPSULE | Freq: Every day | ORAL | Status: DC
  Administered 2013-10-28 – 2013-10-31 (×4): 300 mg via ORAL
  Filled 2013-10-28 (×4): qty 1

## 2013-10-28 MED ORDER — PANTOPRAZOLE SODIUM 40 MG IV SOLR
40.0000 mg | INTRAVENOUS | Status: DC
  Administered 2013-10-28: 40 mg via INTRAVENOUS
  Filled 2013-10-28 (×2): qty 40

## 2013-10-28 MED ORDER — VITAMIN B-6 100 MG PO TABS
100.0000 mg | ORAL_TABLET | Freq: Every day | ORAL | Status: DC
  Administered 2013-10-29 – 2013-10-31 (×3): 100 mg via ORAL
  Filled 2013-10-28 (×4): qty 1

## 2013-10-28 MED ORDER — ONDANSETRON HCL 4 MG/2ML IJ SOLN: 4.0000 mg | Freq: Three times a day (TID) | INTRAMUSCULAR | Status: DC | PRN

## 2013-10-28 MED ORDER — ONDANSETRON HCL 4 MG/2ML IJ SOLN
4.0000 mg | Freq: Four times a day (QID) | INTRAMUSCULAR | Status: DC | PRN
  Filled 2013-10-28: qty 2

## 2013-10-28 MED ORDER — GABAPENTIN 100 MG PO CAPS
100.0000 mg | ORAL_CAPSULE | Freq: Two times a day (BID) | ORAL | Status: DC
  Administered 2013-10-28 – 2013-10-31 (×6): 100 mg via ORAL
  Filled 2013-10-28 (×7): qty 1

## 2013-10-28 MED ORDER — AMLODIPINE BESYLATE 2.5 MG PO TABS
2.5000 mg | ORAL_TABLET | Freq: Every day | ORAL | Status: DC
  Administered 2013-10-28 – 2013-10-31 (×4): 2.5 mg via ORAL
  Filled 2013-10-28 (×4): qty 1

## 2013-10-28 MED ORDER — WARFARIN - PHARMACIST DOSING INPATIENT: Freq: Every day | Status: DC

## 2013-10-28 MED ORDER — MIRABEGRON ER 25 MG PO TB24
25.0000 mg | ORAL_TABLET | Freq: Every day | ORAL | Status: DC
  Administered 2013-10-28 – 2013-10-30 (×3): 25 mg via ORAL
  Filled 2013-10-28 (×4): qty 1

## 2013-10-28 MED ORDER — SODIUM CHLORIDE 0.9 % IV SOLN
INTRAVENOUS | Status: DC
  Administered 2013-10-28 – 2013-10-29 (×2): via INTRAVENOUS

## 2013-10-28 MED ORDER — ACETAMINOPHEN 325 MG PO TABS
650.0000 mg | ORAL_TABLET | Freq: Four times a day (QID) | ORAL | Status: DC | PRN
  Administered 2013-10-28 – 2013-10-29 (×2): 650 mg via ORAL
  Filled 2013-10-28 (×2): qty 2

## 2013-10-28 MED ORDER — SODIUM CHLORIDE 0.9 % IV BOLUS (SEPSIS)
500.0000 mL | Freq: Once | INTRAVENOUS | Status: AC
  Administered 2013-10-28: 500 mL via INTRAVENOUS

## 2013-10-28 MED ORDER — DONEPEZIL HCL 10 MG PO TABS
10.0000 mg | ORAL_TABLET | Freq: Every day | ORAL | Status: DC
  Administered 2013-10-28 – 2013-10-30 (×3): 10 mg via ORAL
  Filled 2013-10-28 (×4): qty 1

## 2013-10-28 MED ORDER — ASPIRIN 81 MG PO CHEW
81.0000 mg | CHEWABLE_TABLET | Freq: Every day | ORAL | Status: DC
  Administered 2013-10-28 – 2013-10-31 (×4): 81 mg via ORAL
  Filled 2013-10-28 (×4): qty 1

## 2013-10-28 MED ORDER — CEFTRIAXONE SODIUM 1 G IJ SOLR
1.0000 g | INTRAMUSCULAR | Status: DC
  Administered 2013-10-28 – 2013-10-29 (×2): 1 g via INTRAVENOUS
  Filled 2013-10-28 (×4): qty 10

## 2013-10-28 MED ORDER — SODIUM CHLORIDE 0.9 % IV SOLN
INTRAVENOUS | Status: AC
  Administered 2013-10-28: 16:00:00 via INTRAVENOUS

## 2013-10-28 MED ORDER — WARFARIN SODIUM 7.5 MG PO TABS
7.5000 mg | ORAL_TABLET | Freq: Once | ORAL | Status: AC
  Administered 2013-10-28: 7.5 mg via ORAL
  Filled 2013-10-28: qty 1

## 2013-10-28 MED ORDER — SODIUM CHLORIDE 0.9 % IV BOLUS (SEPSIS)
1000.0000 mL | Freq: Once | INTRAVENOUS | Status: AC
  Administered 2013-10-28: 1000 mL via INTRAVENOUS

## 2013-10-28 MED ORDER — AMIODARONE HCL 200 MG PO TABS
200.0000 mg | ORAL_TABLET | Freq: Every day | ORAL | Status: DC
  Administered 2013-10-28 – 2013-10-31 (×4): 200 mg via ORAL
  Filled 2013-10-28 (×4): qty 1

## 2013-10-28 NOTE — ED Notes (Signed)
Pt had a large bm and voided in bed pan unbable to void at this time.

## 2013-10-28 NOTE — ED Notes (Signed)
I attempted to draw blood x1, unsuccessful. RN Phineas Douglas notified.

## 2013-10-28 NOTE — ED Provider Notes (Signed)
CSN: [id number]     Arrival date & time 10/28/13  [id number] History   First MD Initiated Contact with Patient 10/28/13 [phone]     Chief Complaint  Patient presents with  . Nausea   (Consider location/radiation/quality/duration/timing/severity/associated sxs/prior Treatment) The history is provided by the patient and the EMS personnel.  pt w hx afib, on coumadin, presents via ems from home w c/o nausea, vomiting and generalized weakness since yesterday. Feels weak/faint when tries to stand. Has vomiting a couple times, clear to yellowish, not bloody or bilious. No abd pain. No diarrhea. No abd distension or constipation. States 5 days ago had a a fall, fractured right ankle, states no surgery, is in walking boot during day. States had been taking pain meds in rehab center for first few days post injury, but denies nv w pain rx. No dysuria or gu c/o. No cp or discomfort. No unusual doe or fatigue. No severe headaches. No neck or back pain.      Past Medical History  Diagnosis Date  . Esophageal stricture   . Hypertension   . Atrial fibrillation   . Cerebrovascular disease, unspecified   . Hyperlipidemia   . DJD (degenerative joint disease)   . Unspecified disorder of kidney and ureter   . Renal cancer   . Osteoarthrosis, unspecified whether generalized or localized, unspecified site   . Generalized anxiety disorder   . GERD (gastroesophageal reflux disease)   . Hiatal hernia   . PN (peripheral neuropathy)     in feet  . Rectal fissure   . Gastric ulcer   . IBS (irritable bowel syndrome)   . Insomnia   . Adenomatous colon polyp 2013  . Diverticulosis   . Skin cancer of nose   . Hx of cardiovascular stress test     Lexiscan Myoview (07/2013): No ischemia, EF 69%, normal study   Past Surgical History  Procedure Laterality Date  . Appendectomy    . Cholecystectomy    . Bunionectomies       BILATERAL  . Anterior and posterior vaginal repair  1989    AP REPAIR & RAZ URETHROPEXY  .  S/p percutaneous cryoablation of right renal cell cancer  07/2009    by [name]  . Partial hysterectomy  1977   Family History  Problem Relation Age of Onset  . Heart disease Father   . Colon cancer Neg Hx   . Stroke Mother    History  Substance Use Topics  . Smoking status: Former Smoker -- 1.00 packs/day for 50 years    Types: Cigarettes    Quit date: 09/30/1988  . Smokeless tobacco: Never Used  . Alcohol Use: 1.2 oz/week    2 Glasses of wine per week     Comment: glass of wine with dinner   OB History   Grav Para Term Preterm Abortions TAB SAB Ect Mult Living                 Review of Systems  Constitutional: Negative for fever and chills.  HENT: Negative for sore throat.   Eyes: Negative for pain and visual disturbance.  Respiratory: Negative for cough and shortness of breath.   Cardiovascular: Negative for chest pain and leg swelling.  Gastrointestinal: Positive for nausea and vomiting. Negative for abdominal pain and diarrhea.  Genitourinary: Negative for dysuria and flank pain.  Musculoskeletal: Negative for back pain and neck pain.  Skin: Negative for rash.  Neurological: Negative for numbness and headaches.  Hematological: Does  not bruise/bleed easily.  Psychiatric/Behavioral: Negative for confusion.    Allergies  Other; Codeine; Penicillins; and Sulfa antibiotics  Home Medications   Current Outpatient Rx  Name  Route  Sig  Dispense  Refill  . amiodarone (PACERONE) 200 MG tablet      TAKE ONE TABLET BY MOUTH ONCE DAILY   30 tablet   0   . amLODipine (NORVASC) 2.5 MG tablet   Oral   Take 2.5 mg by mouth daily.         [name] aspirin 81 MG chewable tablet   Oral   Chew 81 mg by mouth daily.         [name] BIOTIN PO   Oral   Take 1 tablet by mouth daily.         . cetirizine (ZYRTEC) 10 MG tablet   Oral   Take 10 mg by mouth daily.         . Cholecalciferol (VITAMIN D PO)   Oral   Take 1 tablet by mouth daily.         . Coenzyme Q10  (CO Q-10) 100 MG CAPS   Oral   Take 100 mg by mouth daily.         [name] diltiazem (CARDIZEM CD) 300 MG 24 hr capsule      TAKE ONE CAPSULE BY MOUTH ONCE DAILY   90 capsule   0   . donepezil (ARICEPT) 10 MG tablet   Oral   Take 10 mg by mouth at bedtime.         . donepezil (ARICEPT) 5 MG tablet      TAKE ONE TABLET BY MOUTH AT BEDTIME   30 tablet   6   . ezetimibe (ZETIA) 10 MG tablet   Oral   Take 10 mg by mouth daily.         . folic acid (FOLVITE) A999333 MCG tablet   Oral   Take 400 mcg by mouth daily.           [name] gabapentin (NEURONTIN) 100 MG capsule   Oral   Take 100 mg by mouth 2 (two) times daily.         [name] HYDROcodone-acetaminophen (NORCO) 5-325 MG per tablet   Oral   Take 1-2 tablets by mouth every 4 (four) hours as needed for moderate pain.   30 tablet   0   . mirabegron ER (MYRBETRIQ) 25 MG TB24   Oral   Take 25 mg by mouth at bedtime.         . Omega-3 Fatty Acids (FISH OIL PO)   Oral   Take 1 tablet by mouth daily.         [name] omeprazole (PRILOSEC) 40 MG capsule   Oral   Take 1 capsule (40 mg total) by mouth daily.   30 capsule   0   . pravastatin (PRAVACHOL) 40 MG tablet   Oral   Take 40 mg by mouth at bedtime.         . pyridOXINE (VITAMIN B-6) 100 MG tablet   Oral   Take 100 mg by mouth daily.         . sodium chloride (OCEAN) 0.65 % nasal spray   Nasal   Place 2 sprays into the nose as needed. For dry nose         . warfarin (COUMADIN) 5 MG tablet      Take as directed   45 tablet   6   .  warfarin (COUMADIN) 5 MG tablet   Oral   Take 5-7.5 mg by mouth daily. Takes 5 mg daily, except Wednesday and Saturday, then takes 7.5mg          . zolpidem (AMBIEN) 10 MG tablet   Oral   Take 10 mg by mouth at bedtime as needed. For sleep          BP 145/62  Pulse 90  Temp(Src) 98.7 F (37.1 C) (Oral)  Resp 20  SpO2 100% Physical Exam  Nursing note and vitals reviewed. Constitutional: She is oriented to person,  place, and time. She appears well-developed and well-nourished. No distress.  HENT:  Nose: Nose normal.  Mouth/Throat: Oropharynx is clear and moist.  Eyes: Conjunctivae are normal. Pupils are equal, round, and reactive to light. No scleral icterus.  Neck: Normal range of motion. Neck supple. No tracheal deviation present.  Cardiovascular: Normal rate, regular rhythm, normal heart sounds and intact distal pulses.   Pulmonary/Chest: Effort normal and breath sounds normal. No respiratory distress. She exhibits no tenderness.  Abdominal: Soft. Normal appearance and bowel sounds are normal. She exhibits no distension and no mass. There is no tenderness. There is no rebound and no guarding.  No incarc hernia.   Genitourinary:  No cva tenderness  Musculoskeletal: She exhibits no edema and no tenderness.  c spine non tender. Ace wrap right ankle. Distal pulses palp. No leg swelling.   Neurological: She is alert and oriented to person, place, and time.  Skin: Skin is warm and dry. No rash noted. She is not diaphoretic.  Psychiatric: She has a normal mood and affect.    ED Course  Procedures (including critical care time)  Results for orders placed during the hospital encounter of 10/28/13  CBC      Result Value Range   WBC 11.6 (*) 4.0 - 10.5 K/uL   RBC 4.18  3.87 - 5.11 MIL/uL   Hemoglobin 13.5  12.0 - 15.0 g/dL   HCT 40.3  36.0 - 46.0 %   MCV 96.4  78.0 - 100.0 fL   MCH 32.3  26.0 - 34.0 pg   MCHC 33.5  30.0 - 36.0 g/dL   RDW 13.0  11.5 - 15.5 %   Platelets 163  150 - 400 K/uL  COMPREHENSIVE METABOLIC PANEL      Result Value Range   Sodium 141  137 - 147 mEq/L   Potassium 4.2  3.7 - 5.3 mEq/L   Chloride 104  96 - 112 mEq/L   CO2 21  19 - 32 mEq/L   Glucose, Bld 129 (*) 70 - 99 mg/dL   BUN 25 (*) 6 - 23 mg/dL   Creatinine, Ser 0.84  0.50 - 1.10 mg/dL   Calcium 8.9  8.4 - 10.5 mg/dL   Total Protein 7.1  6.0 - 8.3 g/dL   Albumin 3.6  3.5 - 5.2 g/dL   AST 27  0 - 37 U/L   ALT 34   0 - 35 U/L   Alkaline Phosphatase 67  39 - 117 U/L   Total Bilirubin 0.7  0.3 - 1.2 mg/dL   GFR calc non Af Amer 61 (*) >90 mL/min   GFR calc Af Amer 70 (*) >90 mL/min  URINALYSIS, ROUTINE W REFLEX MICROSCOPIC      Result Value Range   Color, Urine YELLOW  YELLOW   APPearance CLOUDY (*) CLEAR   Specific Gravity, Urine 1.021  1.005 - 1.030   pH 6.5  5.0 -  8.0   Glucose, UA NEGATIVE  NEGATIVE mg/dL   Hgb urine dipstick SMALL (*) NEGATIVE   Bilirubin Urine NEGATIVE  NEGATIVE   Ketones, ur NEGATIVE  NEGATIVE mg/dL   Protein, ur NEGATIVE  NEGATIVE mg/dL   Urobilinogen, UA 1.0  0.0 - 1.0 mg/dL   Nitrite POSITIVE (*) NEGATIVE   Leukocytes, UA MODERATE (*) NEGATIVE  TROPONIN I      Result Value Range   Troponin I <0.30  <0.30 ng/mL  URINE MICROSCOPIC-ADD ON      Result Value Range   Squamous Epithelial / LPF RARE  RARE   WBC, UA 3-6  <3 WBC/hpf   RBC / HPF 3-6  <3 RBC/hpf   Bacteria, UA MANY (*) RARE   Dg Ankle Complete Right  10/24/2013   CLINICAL DATA:  Fall, pain and swelling  EXAM: RIGHT ANKLE - COMPLETE 3+ VIEW  COMPARISON:  None.  FINDINGS: There is lateral malleolar soft tissue swelling with an underlying nondisplaced oblique distal left fibular fracture. Ankle mortise is symmetric. No radiopaque foreign body.  IMPRESSION: Nondisplaced distal left fibular fracture with overlying soft tissue swelling.   Electronically Signed   By: Conchita Paris M.D.   On: 10/24/2013 11:13   Ct Head Wo Contrast  10/28/2013   CLINICAL DATA:  Fall.  Coumadin therapy.  EXAM: CT HEAD WITHOUT CONTRAST  TECHNIQUE: Contiguous axial images were obtained from the base of the skull through the vertex without intravenous contrast.  COMPARISON:  CT 11/03/2012.  FINDINGS: No mass. No hydrocephalus. No hemorrhage. Mild white matter changes noted consistent with chronic ischemia. Previously identified left frontal soft tissue swelling has resolved. The visualized paranasal sinuses and mastoids are clear. No acute  bony abnormality.  IMPRESSION: Chronic white matter changes consistent with chronic small vessel ischemia. No acute abnormality.   Electronically Signed   By: Marcello Moores  Register   On: 10/28/2013 10:29      EKG Interpretation    Date/Time:  Thursday October 28 2013 09:58:56 EST Ventricular Rate:  84 PR Interval:  180 QRS Duration: 78 QT Interval:  476 QTC Calculation: 563 R Axis:   -19 Text Interpretation:  Sinus rhythm Prolonged QT interval Baseline wander No significant change since last tracing Confirmed by Garnette Greb  MD, Brittnye Josephs ([id number]) on 10/28/2013 10:11:13 AM            MDM  Iv ns bolus. zofran iv.  Reviewed nursing notes and prior charts for additional history.   Labs.  ua positive. Rocephin.  Pt w persistent nv, unable to tolerate po.  Additional zofran iv.  Recheck abd soft nt. No abd pain.   Med service called to admit re weakness, uti, intractable nv.        Mirna Mires, MD 10/28/13 [phone]

## 2013-10-28 NOTE — ED Notes (Signed)
Bed: KZ99 Expected date:  Expected time:  Means of arrival:  Comments: EMS- elderly, n/v/d

## 2013-10-28 NOTE — ED Notes (Signed)
Pt was sent yesterday from [facility] for a broken ankle  and ate fish when she got home  and now has n/v emesis x3, yellow color in emeisis , denies any abd pain.

## 2013-10-28 NOTE — ED Notes (Signed)
Patient transported to CT 

## 2013-10-28 NOTE — Progress Notes (Signed)
Utilization Review completed.  Anaka Beazer RN CM  

## 2013-10-28 NOTE — H&P (Addendum)
[facility] History and Physical  Merrie Epler Marcott OZD:[id number] DOB: December 04, 1923 DOA: 10/28/2013  Referring physician: EDP PCP: Mathews Argyle, MD   Chief Complaint: im sick  HPI: Anne Hayes is a 80 y.o. female with PMH of Afib on coumadin, HTN, GERD suffered a recent ankle/fibular fracture on 1/24 following a fall, she then stayed briefly at the Rehab part of [facility] and went back home to [place] living yesterday. He reports eating some fish for dinner yesterday evening and after this started experiencing nausea and vomiting, 3-4 episodes of vomiting, non bloodly and non bilious.  She denies any diarrhea, abd pain, fever or chills. Endorses some urinary frequency and weakness. Due to persistent weakness came to the ER today, last episode of vomiting was earlier this am. In ER, vitals stable, UA concerning for UTI, and persistent nausea despite anti-emetic and Po trial and hence TRH consulted for further management.   Review of Systems: positives bolded Constitutional:  No weight loss, night sweats, Fevers, chills, fatigue.  HEENT:  No headaches, Difficulty swallowing,Tooth/dental problems,Sore throat,  No sneezing, itching, ear ache, nasal congestion, post nasal drip,  Cardio-vascular:  No chest pain, Orthopnea, PND, swelling in lower extremities, anasarca, dizziness, palpitations  GI:  No heartburn, indigestion, abdominal pain, nausea, vomiting, diarrhea, change in bowel habits, loss of appetite  Resp:  No shortness of breath with exertion or at rest. No excess mucus, no productive cough, No non-productive cough, No coughing up of blood.No change in color of mucus.No wheezing.No chest wall deformity  Skin:  no rash or lesions.  GU:  no dysuria, change in color of urine, no urgency or frequency. No flank pain.  Musculoskeletal:  No joint pain or swelling. No decreased range of motion. No back pain.  Psych:  No change in mood or affect. No depression or  anxiety. No memory loss.   Past Medical History  Diagnosis Date  . Esophageal stricture   . Hypertension   . Atrial fibrillation   . Cerebrovascular disease, unspecified   . Hyperlipidemia   . DJD (degenerative joint disease)   . Unspecified disorder of kidney and ureter   . Renal cancer   . Osteoarthrosis, unspecified whether generalized or localized, unspecified site   . Generalized anxiety disorder   . GERD (gastroesophageal reflux disease)   . Hiatal hernia   . PN (peripheral neuropathy)     in feet  . Rectal fissure   . Gastric ulcer   . IBS (irritable bowel syndrome)   . Insomnia   . Adenomatous colon polyp 2013  . Diverticulosis   . Skin cancer of nose   . Hx of cardiovascular stress test     Lexiscan Myoview (07/2013): No ischemia, EF 69%, normal study  . PONV (postoperative nausea and vomiting)    Past Surgical History  Procedure Laterality Date  . Appendectomy    . Cholecystectomy    . Bunionectomies       BILATERAL  . Anterior and posterior vaginal repair  1989    AP REPAIR & RAZ URETHROPEXY  . S/p percutaneous cryoablation of right renal cell cancer  07/2009    by [name]  . Partial hysterectomy  1977   Social History:  reports that she quit smoking about 25 years ago. Her smoking use included Cigarettes. She has a 50 pack-year smoking history. She has never used smokeless tobacco. She reports that she drinks about 1.2 ounces of alcohol per week. She reports that she does not use illicit drugs.  Allergies  Allergen Reactions  . Other Nausea And Vomiting    Any kind of BELL PEPPER.   Extreme, violent nausea and vomiting quickly leading to dehydration  . Codeine Nausea And Vomiting       . Penicillins Nausea And Vomiting  . Sulfa Antibiotics Itching    Family History  Problem Relation Age of Onset  . Heart disease Father   . Colon cancer Neg Hx   . Stroke Mother      Prior to Admission medications   Medication Sig Start Date End Date Taking?  Authorizing Provider  amiodarone (PACERONE) 200 MG tablet TAKE ONE TABLET BY MOUTH ONCE DAILY 09/04/13  Yes Thompson Grayer, MD  amLODipine (NORVASC) 2.5 MG tablet Take 2.5 mg by mouth daily.   Yes Historical Provider, MD  aspirin 81 MG chewable tablet Chew 81 mg by mouth daily.   Yes Historical Provider, MD  cetirizine (ZYRTEC) 10 MG tablet Take 10 mg by mouth daily.   Yes Historical Provider, MD  Cholecalciferol (VITAMIN D PO) Take 1 tablet by mouth daily.   Yes Historical Provider, MD  Coenzyme Q10 (CO Q-10) 100 MG CAPS Take 100 mg by mouth daily.   Yes Historical Provider, MD  diltiazem (CARDIZEM CD) 300 MG 24 hr capsule TAKE ONE CAPSULE BY MOUTH ONCE DAILY 07/31/13  Yes Thompson Grayer, MD  donepezil (ARICEPT) 10 MG tablet Take 10 mg by mouth at bedtime.   Yes Historical Provider, MD  ezetimibe (ZETIA) 10 MG tablet Take 10 mg by mouth daily.   Yes Historical Provider, MD  gabapentin (NEURONTIN) 100 MG capsule Take 100 mg by mouth 2 (two) times daily.   Yes Historical Provider, MD  mirabegron ER (MYRBETRIQ) 25 MG TB24 Take 25 mg by mouth at bedtime.   Yes Historical Provider, MD  Omega-3 Fatty Acids (FISH OIL PO) Take 1 tablet by mouth daily.   Yes Historical Provider, MD  pravastatin (PRAVACHOL) 40 MG tablet Take 40 mg by mouth at bedtime.   Yes Historical Provider, MD  pyridOXINE (VITAMIN B-6) 100 MG tablet Take 100 mg by mouth daily.   Yes Historical Provider, MD  sodium chloride (OCEAN) 0.65 % nasal spray Place 2 sprays into the nose as needed. For dry nose   Yes Historical Provider, MD  warfarin (COUMADIN) 5 MG tablet Take 5-7.5 mg by mouth daily. Takes 5 mg daily, except Wednesday and Saturday, then takes 7.5mg    Yes Historical Provider, MD  zolpidem (AMBIEN) 10 MG tablet Take 10 mg by mouth at bedtime as needed. For sleep   Yes Historical Provider, MD   Physical Exam: Filed Vitals:   10/28/13 1328  BP: 137/57  Pulse: 84  Temp: 98.8 F (37.1 C)  Resp: 18    BP 137/57  Pulse 84   Temp(Src) 98.8 F (37.1 C) (Oral)  Resp 18  SpO2 98%  General:  Appears calm and comfortable, AAOx3, no distress Eyes: PERRL, normal lids, irises & conjunctiva ENT: grossly normal hearing, lips & tongue Neck: no LAD, masses or thyromegaly Cardiovascular: RRR, no m/r/g. No LE edema. Telemetry: SR, no arrhythmias  Respiratory: CTA bilaterally, no w/r/r. Normal respiratory effort. Abdomen: soft, mildly distended, BS present Skin: no rash or induration seen on limited exam Musculoskeletal: mild swelling and ACE wrap on L ankle Psychiatric: grossly normal mood and affect, speech fluent and appropriate Neurologic: grossly non-focal.          Labs on Admission:  Basic Metabolic Panel:  Recent Labs Lab 10/28/13 1030  NA 141  K 4.2  CL 104  CO2 21  GLUCOSE 129*  BUN 25*  CREATININE 0.84  CALCIUM 8.9   Liver Function Tests:  Recent Labs Lab 10/28/13 1030  AST 27  ALT 34  ALKPHOS 67  BILITOT 0.7  PROT 7.1  ALBUMIN 3.6   No results found for this basename: LIPASE, AMYLASE,  in the last 168 hours No results found for this basename: AMMONIA,  in the last 168 hours CBC:  Recent Labs Lab 10/28/13 1030  WBC 11.6*  HGB 13.5  HCT 40.3  MCV 96.4  PLT 163   Cardiac Enzymes:  Recent Labs Lab 10/28/13 1030  TROPONINI <0.30    BNP (last 3 results) No results found for this basename: PROBNP,  in the last 8760 hours CBG: No results found for this basename: GLUCAP,  in the last 168 hours  Radiological Exams on Admission: Ct Head Wo Contrast  10/28/2013   CLINICAL DATA:  Fall.  Coumadin therapy.  EXAM: CT HEAD WITHOUT CONTRAST  TECHNIQUE: Contiguous axial images were obtained from the base of the skull through the vertex without intravenous contrast.  COMPARISON:  CT 11/03/2012.  FINDINGS: No mass. No hydrocephalus. No hemorrhage. Mild white matter changes noted consistent with chronic ischemia. Previously identified left frontal soft tissue swelling has resolved. The  visualized paranasal sinuses and mastoids are clear. No acute bony abnormality.  IMPRESSION: Chronic white matter changes consistent with chronic small vessel ischemia. No acute abnormality.   Electronically Signed   By: Marcello Moores  Register   On: 10/28/2013 10:29    EKG: Independently reviewed. NSR, QTC prolonged  Assessment/Plan Active Problems:   Dehydration   UTI (urinary tract infection)   Nausea and vomiting   Closed fibular fracture   1. Nausea and vomiting -likely UTI related vs viral gastroenteritis  -continue ceftriaxone, Fu urine Cultures -IVF, supportive care,  -clears advance diet as tolerated -PPI  2. Recent Ankle fracture -pain control, non operative mgt -continue ACE wrap, Boot for mobility  3. P.Afib -rate controlled -continue cardizem, amiodarone, and coumadin -QTC prolonged, avoid quinolones or other meds that worsen this, repeat EKG in am  4. HTN -stable, continue amlodipine  5. Mild cognitive dysfunction/memory loss -continue aricept  DVT proph: on anticoagulation  Code Status:Full Code Family Communication: d/w daughter at bedside Disposition Plan: inpatient Time spent: 12min  Ignatius Kloos [facility] Pager [phone]

## 2013-10-28 NOTE — Progress Notes (Signed)
ANTICOAGULATION CONSULT NOTE - Initial Consult  Pharmacy Consult for Coumadin Indication: atrial fibrillation  Allergies  Allergen Reactions  . Other Nausea And Vomiting    Any kind of BELL PEPPER.   Extreme, violent nausea and vomiting quickly leading to dehydration  . Codeine Nausea And Vomiting       . Penicillins Nausea And Vomiting  . Sulfa Antibiotics Itching    Patient Measurements: Height: 5\' 6"  (167.6 cm) Weight: 134 lb (60.782 kg) IBW/kg (Calculated) : 59.3  Vital Signs: Temp: 98 F (36.7 C) (01/29 1709) Temp src: Oral (01/29 1709) BP: 145/57 mmHg (01/29 1709) Pulse Rate: 83 (01/29 1709)  Labs:  Recent Labs  10/28/13 1030 10/28/13 1505  HGB 13.5  --   HCT 40.3  --   PLT 163  --   LABPROT  --  21.6*  INR  --  1.94*  CREATININE 0.84  --   TROPONINI <0.30  --     Estimated Creatinine Clearance: 44.2 ml/min (by C-G formula based on Cr of 0.84).   Medical History: Past Medical History  Diagnosis Date  . Esophageal stricture   . Hypertension   . Atrial fibrillation   . Cerebrovascular disease, unspecified   . Hyperlipidemia   . DJD (degenerative joint disease)   . Unspecified disorder of kidney and ureter   . Renal cancer   . Osteoarthrosis, unspecified whether generalized or localized, unspecified site   . Generalized anxiety disorder   . GERD (gastroesophageal reflux disease)   . Hiatal hernia   . PN (peripheral neuropathy)     in feet  . Rectal fissure   . Gastric ulcer   . IBS (irritable bowel syndrome)   . Insomnia   . Adenomatous colon polyp 2013  . Diverticulosis   . Skin cancer of nose   . Hx of cardiovascular stress test     Lexiscan Myoview (07/2013): No ischemia, EF 69%, normal study  . PONV (postoperative nausea and vomiting)     Medications:  Prescriptions prior to admission  Medication Sig Dispense Refill  . amiodarone (PACERONE) 200 MG tablet TAKE ONE TABLET BY MOUTH ONCE DAILY  30 tablet  0  . amLODipine (NORVASC) 2.5  MG tablet Take 2.5 mg by mouth daily.      [name] aspirin 81 MG chewable tablet Chew 81 mg by mouth daily.      . cetirizine (ZYRTEC) 10 MG tablet Take 10 mg by mouth daily.      . Cholecalciferol (VITAMIN D PO) Take 1 tablet by mouth daily.      . Coenzyme Q10 (CO Q-10) 100 MG CAPS Take 100 mg by mouth daily.      [name] diltiazem (CARDIZEM CD) 300 MG 24 hr capsule TAKE ONE CAPSULE BY MOUTH ONCE DAILY  90 capsule  0  . donepezil (ARICEPT) 10 MG tablet Take 10 mg by mouth at bedtime.      [name] ezetimibe (ZETIA) 10 MG tablet Take 10 mg by mouth daily.      [name] gabapentin (NEURONTIN) 100 MG capsule Take 100 mg by mouth 2 (two) times daily.      . mirabegron ER (MYRBETRIQ) 25 MG TB24 Take 25 mg by mouth at bedtime.      . Omega-3 Fatty Acids (FISH OIL PO) Take 1 tablet by mouth daily.      . pravastatin (PRAVACHOL) 40 MG tablet Take 40 mg by mouth at bedtime.      . pyridOXINE (VITAMIN B-6) 100 MG tablet Take  100 mg by mouth daily.      . sodium chloride (OCEAN) 0.65 % nasal spray Place 2 sprays into the nose as needed. For dry nose      . warfarin (COUMADIN) 5 MG tablet Take 5-7.5 mg by mouth daily. Takes 5 mg daily, except Wednesday and Saturday, then takes 7.5mg       . zolpidem (AMBIEN) 10 MG tablet Take 10 mg by mouth at bedtime as needed. For sleep       Scheduled:  . sodium chloride   Intravenous STAT  . amiodarone  200 mg Oral Daily  . amLODipine  2.5 mg Oral Daily  . aspirin  81 mg Oral Daily  . cefTRIAXone (ROCEPHIN)  IV  1 g Intravenous Q24H  . diltiazem  300 mg Oral Daily  . donepezil  10 mg Oral QHS  . ezetimibe  10 mg Oral Daily  . gabapentin  100 mg Oral BID  . mirabegron ER  25 mg Oral QHS  . pantoprazole (PROTONIX) IV  40 mg Intravenous Q24H  . pyridOXINE  100 mg Oral Daily    Assessment: 80yo F admitted with N/V and UTI. Takes Coumadin for A.fib. Pharmacy is asked to dose. Home regimen: 5mg  daily, except 7.5mg  Wed/Sat. Last dose was taken on 1/28(Wed) - patient states she took  5mg .  INR is slightly subtherapeutic on admission.   CBC is wnl.   Concurrent amiodarone and aspirin noted.  Abx can increase response to Coumadin.   Goal of Therapy:  INR 2-3 Monitor platelets by anticoagulation protocol: Yes   Plan:   Give Coumadin 7.5mg  PO x 1 tonight.  Daily PT/INR.  Romeo [name], PharmD, pager [phone]. 10/28/2013,5:42 PM.

## 2013-10-29 DIAGNOSIS — I4891 Unspecified atrial fibrillation: Secondary | ICD-10-CM

## 2013-10-29 LAB — CBC
HEMATOCRIT: 34.3 % — AB (ref 36.0–46.0)
HEMOGLOBIN: 11.3 g/dL — AB (ref 12.0–15.0)
MCH: 31.8 pg (ref 26.0–34.0)
MCHC: 32.9 g/dL (ref 30.0–36.0)
MCV: 96.6 fL (ref 78.0–100.0)
Platelets: 162 10*3/uL (ref 150–400)
RBC: 3.55 MIL/uL — AB (ref 3.87–5.11)
RDW: 13.2 % (ref 11.5–15.5)
WBC: 6.4 10*3/uL (ref 4.0–10.5)

## 2013-10-29 LAB — BASIC METABOLIC PANEL
BUN: 15 mg/dL (ref 6–23)
CHLORIDE: 109 meq/L (ref 96–112)
CO2: 23 meq/L (ref 19–32)
Calcium: 8 mg/dL — ABNORMAL LOW (ref 8.4–10.5)
Creatinine, Ser: 0.86 mg/dL (ref 0.50–1.10)
GFR calc Af Amer: 68 mL/min — ABNORMAL LOW (ref >90)
GFR calc non Af Amer: 59 mL/min — ABNORMAL LOW (ref >90)
GLUCOSE: 89 mg/dL (ref 70–99)
Potassium: 3.9 mEq/L (ref 3.7–5.3)
SODIUM: 142 meq/L (ref 137–147)

## 2013-10-29 LAB — PROTIME-INR
INR: 2.09 — ABNORMAL HIGH (ref 0.00–1.49)
Prothrombin Time: 22.8 seconds — ABNORMAL HIGH (ref 11.6–15.2)

## 2013-10-29 LAB — CLOSTRIDIUM DIFFICILE BY PCR: Toxigenic C. Difficile by PCR: NEGATIVE

## 2013-10-29 MED ORDER — WARFARIN SODIUM 5 MG PO TABS
5.0000 mg | ORAL_TABLET | Freq: Once | ORAL | Status: AC
  Administered 2013-10-29: 5 mg via ORAL
  Filled 2013-10-29: qty 1

## 2013-10-29 MED ORDER — WARFARIN SODIUM 5 MG PO TABS
5.0000 mg | ORAL_TABLET | Freq: Once | ORAL | Status: DC
  Filled 2013-10-29: qty 1

## 2013-10-29 MED ORDER — PANTOPRAZOLE SODIUM 40 MG PO TBEC
40.0000 mg | DELAYED_RELEASE_TABLET | Freq: Every day | ORAL | Status: DC
  Administered 2013-10-29 – 2013-10-30 (×2): 40 mg via ORAL
  Filled 2013-10-29 (×3): qty 1

## 2013-10-29 MED ORDER — BOOST PLUS PO LIQD
237.0000 mL | Freq: Two times a day (BID) | ORAL | Status: DC
  Administered 2013-10-29 – 2013-10-31 (×4): 237 mL via ORAL
  Filled 2013-10-29 (×5): qty 237

## 2013-10-29 MED ORDER — HYDROCODONE-ACETAMINOPHEN 5-325 MG PO TABS
1.0000 | ORAL_TABLET | Freq: Four times a day (QID) | ORAL | Status: DC | PRN
  Administered 2013-10-29 – 2013-10-30 (×3): 1 via ORAL
  Filled 2013-10-29 (×3): qty 1

## 2013-10-29 MED ORDER — LOPERAMIDE HCL 2 MG PO CAPS
2.0000 mg | ORAL_CAPSULE | ORAL | Status: DC | PRN
  Administered 2013-10-29: 2 mg via ORAL
  Filled 2013-10-29 (×2): qty 1

## 2013-10-29 NOTE — Progress Notes (Signed)
Clinical Social Work Department BRIEF PSYCHOSOCIAL ASSESSMENT 10/29/2013  Patient:  Anne Hayes,Anne Hayes     Account Number:  [id number]     Admit date:  10/28/2013  Clinical Social Worker:  ,, LCSW  Date/Time:  10/29/2013 10:00 AM  Referred by:  Physician  Date Referred:  10/29/2013 Referred for  SNF Placement   Other Referral:   Interview type:  Patient Other interview type:    PSYCHOSOCIAL DATA Living Status:  FACILITY Admitted from facility:  [facility] Level of care:  Independent Living Primary support name:  Pam Primary support relationship to patient:  CHILD, ADULT Degree of support available:   Adequate    CURRENT CONCERNS Current Concerns  Post-Acute Placement   Other Concerns:    SOCIAL WORK ASSESSMENT / PLAN CSW received referral in order to assist with DC planning. Per chart review, patient is from [facility]. CSW met with patient at bedside. CSW introduced myself and explained role.    Patient reports she has been living at [facility] for the past 20 years. Patient has been in the independent living section and is living in a cottage. Patient had a fall last week and was sent to rehab but returned home after about 1 week to be with her dog. Patient reports she continues to feel weak and is unsure about DC plans. CSW explained that [facility] is open to patient returning to SNF. Patient reports that since she is feeling very weak then it would be best for her to go back to skilled. Patient reports dtr is coming to visit this afternoon and she will talk with her about plans.    CSW completed FL2 and faxed to [facility]. CSW spoke with SNF who reports if patient is ready to DC over the weekend then CSW can call RN at [phone] and can fax information to [phone]. CSW placed signed FL2 on chart.    CSW will leave handoff for weekend CSW in case patient is medically stable to DC.   Assessment/plan status:  Psychosocial Support/Ongoing Assessment of  Needs Other assessment/ plan:   Information/referral to community resources:   SNF information    PATIENT'S/FAMILY'S RESPONSE TO PLAN OF CARE: Patient alert and oriented. Patient somewhat guarded when discussing SNF placement. Patient reports she is independent and wants to go home. After talking about DC plans in further detail, patient became tearful and reports if she does not want SNF because she has a dog at home that she needs to care for. Patient reports that her dog has never been to a kennel and it would not be fair for her dog. CSW encouraged patient to identify solutions such as asking dtr to watch dog while patient rehabs. Patient reports that her dtr is helpful and would probably agree to plans. CSW reminded patient if she does well at rehab then she can better care for her dog. Patient agreeable to SNF and understanding of additional care needed.     , LCSW [phone]    

## 2013-10-29 NOTE — Progress Notes (Addendum)
Clinical Social Work Department CLINICAL SOCIAL WORK PLACEMENT NOTE 10/29/2013  Patient:  Anne Hayes, Anne Hayes  Account Number:  [id number] [place] date:  10/28/2013  Clinical Social Worker:  Sindy Messing, LCSW  Date/time:  10/29/2013 10:00 AM  Clinical Social Work is seeking post-discharge placement for this patient at the following level of care:      (*CSW will update this form in Epic as items are completed)    Patient declined  Patient/family provided with [place] Department of Clinical Social Work's list of facilities offering this level of care within the geographic area requested by the patient (or if unable, by the patient's family).   10/29/2013 Patient/family informed of their freedom to choose among providers that offer the needed level of care, that participate in Medicare, Medicaid or managed care program needed by the patient, have an available bed and are willing to accept the patient.   10/29/2013 Patient/family informed of [facility]' ownership interest in [facility], as well as of the fact that they are under no obligation to receive care at this facility.  PASARR submitted to EDS on 10/29/2013 PASARR number received from EDS on 10/29/2013  FL2 transmitted to all facilities in geographic area requested by pt/family on  10/29/2013 FL2 transmitted to all facilities within larger geographic area on   Patient informed that his/her managed care company has contracts with or will negotiate with  certain facilities, including the following:     Patient/family informed of bed offers received:  10/29/2013 Patient chooses bed at [facility] Physician recommends and patient chooses bed at    Patient to be transferred to [facility]  on  10/31/13 Patient to be transferred to facility by DTR  The following physician request were entered in Epic:   Additional Comments:

## 2013-10-29 NOTE — Progress Notes (Signed)
Anne Hayes for Coumadin Indication: atrial fibrillation  Allergies  Allergen Reactions  . Other Nausea And Vomiting    Any kind of BELL PEPPER.   Extreme, violent nausea and vomiting quickly leading to dehydration  . Codeine Nausea And Vomiting       . Penicillins Nausea And Vomiting  . Sulfa Antibiotics Itching    Patient Measurements: Height: 5\' 6"  (167.6 cm) Weight: 134 lb (60.782 kg) IBW/kg (Calculated) : 59.3  Vital Signs: Temp: 98.4 F (36.9 C) (01/30 0533) Temp src: Oral (01/30 0533) BP: 124/64 mmHg (01/30 0533) Pulse Rate: 69 (01/30 0533)  Labs:  Recent Labs  10/28/13 1030 10/28/13 1505 10/29/13 0510  HGB 13.5  --  11.3*  HCT 40.3  --  34.3*  PLT 163  --  162  LABPROT  --  21.6* 22.8*  INR  --  1.94* 2.09*  CREATININE 0.84  --  0.86  TROPONINI <0.30  --   --     Estimated Creatinine Clearance: 43.1 ml/min (by C-G formula based on Cr of 0.86).   Medical History: Past Medical History  Diagnosis Date  . Esophageal stricture   . Hypertension   . Atrial fibrillation   . Cerebrovascular disease, unspecified   . Hyperlipidemia   . DJD (degenerative joint disease)   . Unspecified disorder of kidney and ureter   . Renal cancer   . Osteoarthrosis, unspecified whether generalized or localized, unspecified site   . Generalized anxiety disorder   . GERD (gastroesophageal reflux disease)   . Hiatal hernia   . PN (peripheral neuropathy)     in feet  . Rectal fissure   . Gastric ulcer   . IBS (irritable bowel syndrome)   . Insomnia   . Adenomatous colon polyp 2013  . Diverticulosis   . Skin cancer of nose   . Hx of cardiovascular stress test     Lexiscan Myoview (07/2013): No ischemia, EF 69%, normal study  . PONV (postoperative nausea and vomiting)     Medications:  Prescriptions prior to admission  Medication Sig Dispense Refill  . amiodarone (PACERONE) 200 MG tablet TAKE ONE TABLET BY MOUTH ONCE DAILY  30  tablet  0  . amLODipine (NORVASC) 2.5 MG tablet Take 2.5 mg by mouth daily.      [name] aspirin 81 MG chewable tablet Chew 81 mg by mouth daily.      . cetirizine (ZYRTEC) 10 MG tablet Take 10 mg by mouth daily.      . Cholecalciferol (VITAMIN D PO) Take 1 tablet by mouth daily.      . Coenzyme Q10 (CO Q-10) 100 MG CAPS Take 100 mg by mouth daily.      [name] diltiazem (CARDIZEM CD) 300 MG 24 hr capsule TAKE ONE CAPSULE BY MOUTH ONCE DAILY  90 capsule  0  . donepezil (ARICEPT) 10 MG tablet Take 10 mg by mouth at bedtime.      [name] ezetimibe (ZETIA) 10 MG tablet Take 10 mg by mouth daily.      [name] gabapentin (NEURONTIN) 100 MG capsule Take 100 mg by mouth 2 (two) times daily.      . mirabegron ER (MYRBETRIQ) 25 MG TB24 Take 25 mg by mouth at bedtime.      . Omega-3 Fatty Acids (FISH OIL PO) Take 1 tablet by mouth daily.      . pravastatin (PRAVACHOL) 40 MG tablet Take 40 mg by mouth at bedtime.      [name]  pyridOXINE (VITAMIN B-6) 100 MG tablet Take 100 mg by mouth daily.      . sodium chloride (OCEAN) 0.65 % nasal spray Place 2 sprays into the nose as needed. For dry nose      . warfarin (COUMADIN) 5 MG tablet Take 5-7.5 mg by mouth daily. Takes 5 mg daily, except Wednesday and Saturday, then takes 7.5mg       . zolpidem (AMBIEN) 10 MG tablet Take 10 mg by mouth at bedtime as needed. For sleep       Scheduled:  . amiodarone  200 mg Oral Daily  . amLODipine  2.5 mg Oral Daily  . aspirin  81 mg Oral Daily  . cefTRIAXone (ROCEPHIN)  IV  1 g Intravenous Q24H  . diltiazem  300 mg Oral Daily  . donepezil  10 mg Oral QHS  . ezetimibe  10 mg Oral Daily  . gabapentin  100 mg Oral BID  . mirabegron ER  25 mg Oral QHS  . pantoprazole  40 mg Oral Q1200  . pyridOXINE  100 mg Oral Daily  . Warfarin - Pharmacist Dosing Inpatient   Does not apply q1800   Inpatient warfarin doses administered:   1/29 - 7.5mg   Assessment: 80yo F admitted with N/V and UTI. Takes Coumadin for A.fib. Pharmacy is asked to dose. Home  regimen: 5mg  daily, except 7.5mg  Wed/Sat. Last dose was taken on 1/28(Wed) - patient states she took 5mg . Concurrent amiodarone and aspirin noted.  INR was slightly subtherapeutic on admission.   1/30:  INR therapeutic after warfarin booster dose (7.5mg  total) yesterday.  No bleeding reported.  Hgb and Hct down slightly -- hopefully this is dilutional.  Concomitant ceftriaxone noted.  Antibiotics can increase INR on warfarin but the effect from cephalosporins is typically not large.    Goal of Therapy:  INR 2-3    Plan:   Warfarin 5 mg PO today as per patient's usual regimen.  Daily PT/INR while inpatient.  Clayburn Pert, PharmD, BCPS Pager: [phone] 10/29/2013  8:55 AM

## 2013-10-29 NOTE — Evaluation (Signed)
Physical Therapy Evaluation Patient Details Name: Anne Hayes MRN: [id number] DOB: Jul 09, 1924 Today's Date: 10/29/2013 Time: 0932-6712 PT Time Calculation (min): 21 min  PT Assessment / Plan / Recommendation History of Present Illness  80 y.o. female with PMH of Afib on coumadin, HTN, GERD suffered a recent ankle/fibular fracture on 1/24 following a fall, she then stayed briefly at the Rehab part of [facility] home and went back home to [facility] living yesterday.  Clinical Impression  Pt admitted with likely UTI related vs viral gastroenteritis. Pt currently with functional limitations due to the deficits listed below (see PT Problem List).   Pt will benefit from skilled PT to increase their independence and safety with mobility to allow discharge to the venue listed below.  Pt assisted to recliner today however unable to tolerate ambulation due to R ankle pain.       PT Assessment  Patient needs continued PT services    Follow Up Recommendations  SNF    Does the patient have the potential to tolerate intense rehabilitation      Barriers to Discharge        Equipment Recommendations  Rolling walker with 5" wheels    Recommendations for Other Services     Frequency Min 3X/week    Precautions / Restrictions Precautions Precautions: Fall Restrictions RLE Weight Bearing: Weight bearing as tolerated Other Position/Activity Restrictions: with CAM boot per pt and daughter   Pertinent Vitals/Pain Pt reports increased R ankle pain, RN notified, repositioned and elevated R LE      Mobility  Bed Mobility Overal bed mobility: Needs Assistance Bed Mobility: Supine to Sit Supine to sit: Min guard General bed mobility comments: verbal cues for technique, pt min/guard due to becoming dizzy closer to EOB Transfers Overall transfer level: Needs assistance Equipment used: Rolling walker (2 wheeled) Transfers: Sit to/from [facility] Sit to Stand: Min guard Stand  pivot transfers: Min guard General transfer comment: verbal cues for safe technique including hand placement, used RW to assist with pain control, pt only able to tolerate transfer to recliner today - no ambulation    Exercises     PT Diagnosis: Difficulty walking;Generalized weakness  PT Problem List: Decreased strength;Decreased mobility;Decreased activity tolerance;Decreased knowledge of use of DME;Pain PT Treatment Interventions: Gait training;DME instruction;Stair training;Functional mobility training;Therapeutic activities;Therapeutic exercise;Neuromuscular re-education;Balance training;Patient/family education     PT Goals(Current goals can be found in the care plan section) Acute Rehab PT Goals PT Goal Formulation: With patient Time For Goal Achievement: 11/05/13 Potential to Achieve Goals: Good  Visit Information  Last PT Received On: 10/29/13 Assistance Needed: +1 History of Present Illness: 80 y.o. female with PMH of Afib on coumadin, HTN, GERD suffered a recent ankle/fibular fracture on 1/24 following a fall, she then stayed briefly at the Rehab part of [facility] home and went back home to [facility] living yesterday.       Prior [facility] expects to be discharged to:: Skilled nursing facility Living Arrangements: Alone Additional Comments: Lives in independent living facility and plans to d/c to rehab section Prior Function Level of Independence: Independent Comments: recently using SPC after ankle fx with CAM Boot Communication Communication: No difficulties    Cognition  Cognition Arousal/Alertness: Awake/alert Behavior During Therapy: WFL for tasks assessed/performed Overall Cognitive Status: Within Functional Limits for tasks assessed    Extremity/Trunk Assessment Lower Extremity Assessment Lower Extremity Assessment: Generalized weakness;RLE deficits/detail RLE Deficits / Details: R ankle fibular fx recently, ace wrap at night  and CAM boot during day per pt   Balance    End of Session PT - End of Session Activity Tolerance: Patient limited by pain Patient left: in chair;with call bell/phone within reach;with family/visitor present Nurse Communication: Mobility status;Patient requests pain meds  GP     Breonia Kirstein,KATHrine E 10/29/2013, 12:39 PM Carmelia Bake, PT, DPT 10/29/2013 Pager: [phone]

## 2013-10-29 NOTE — Progress Notes (Addendum)
[facility] PROGRESS NOTE  Anne Hayes ATF:[id number] DOB: 21-Sep-1924 DOA: 10/28/2013 PCP: Mathews Argyle, MD  Assessment/Plan: 1. Nausea/vomiting./diarrhea -no further vomiting but still with some diarrhea,  -likely viral gastroenteritis, rule out Cdiff  -if cdiff negative, will try imodium -IVF, supportive care,  -advance diet as tolerated  -PPI   2. UTI -continue ceftriaxone, Fu urine Cultures   3. Recent Ankle fracture  -pain control, non operative mgt  -continue ACE wrap, Boot for mobility   3. P.Afib  -rate controlled  -continue cardizem, amiodarone, and coumadin  -QTC prolonged, avoid quinolones or other meds that worsen this, repeat EKG pending   4. HTN  -stable, continue amlodipine   5. Mild cognitive dysfunction/memory loss  -continue aricept   DVT proph: on anticoagulation   Code Status: Full Code Family Communication: none at bedside Disposition Plan: may need SNF   Antibiotics:  ceftriaxone  HPI/Subjective: Having diarrhea, denies any nausea or vomiting  Objective: Filed Vitals:   10/29/13 0533  BP: 124/64  Pulse: 69  Temp: 98.4 F (36.9 C)  Resp: 18    Intake/Output Summary (Last 24 hours) at 10/29/13 1406 Last data filed at 10/29/13 0921  Gross per 24 hour  Intake   1525 ml  Output      5 ml  Net   1520 ml   Filed Weights   10/28/13 1709  Weight: 60.782 kg (134 lb)    Exam:   General:  AAOx3, no distress  Cardiovascular: S1S2/RRR  Respiratory: CTAB  Abdomen: soft, Nt, mildly distended, BS present  Musculoskeletal: no edema c/c  Data Reviewed: Basic Metabolic Panel:  Recent Labs Lab 10/28/13 1030 10/29/13 0510  NA 141 142  K 4.2 3.9  CL 104 109  CO2 21 23  GLUCOSE 129* 89  BUN 25* 15  CREATININE 0.84 0.86  CALCIUM 8.9 8.0*   Liver Function Tests:  Recent Labs Lab 10/28/13 1030  AST 27  ALT 34  ALKPHOS 67  BILITOT 0.7  PROT 7.1  ALBUMIN 3.6   No results found for this basename:  LIPASE, AMYLASE,  in the last 168 hours No results found for this basename: AMMONIA,  in the last 168 hours CBC:  Recent Labs Lab 10/28/13 1030 10/29/13 0510  WBC 11.6* 6.4  HGB 13.5 11.3*  HCT 40.3 34.3*  MCV 96.4 96.6  PLT 163 162   Cardiac Enzymes:  Recent Labs Lab 10/28/13 1030  TROPONINI <0.30   BNP (last 3 results) No results found for this basename: PROBNP,  in the last 8760 hours CBG: No results found for this basename: GLUCAP,  in the last 168 hours  Recent Results (from the past 240 hour(s))  CLOSTRIDIUM DIFFICILE BY PCR     Status: None   Collection Time    10/29/13  9:12 AM      Result Value Range Status   C difficile by pcr NEGATIVE  NEGATIVE Final   Comment: Performed at [facility]     Studies: Ct Head Wo Contrast  10/28/2013   CLINICAL DATA:  Fall.  Coumadin therapy.  EXAM: CT HEAD WITHOUT CONTRAST  TECHNIQUE: Contiguous axial images were obtained from the base of the skull through the vertex without intravenous contrast.  COMPARISON:  CT 11/03/2012.  FINDINGS: No mass. No hydrocephalus. No hemorrhage. Mild white matter changes noted consistent with chronic ischemia. Previously identified left frontal soft tissue swelling has resolved. The visualized paranasal sinuses and mastoids are clear. No acute bony abnormality.  IMPRESSION: Chronic  white matter changes consistent with chronic small vessel ischemia. No acute abnormality.   Electronically Signed   By: Marcello Moores  Register   On: 10/28/2013 10:29    Scheduled Meds: . amiodarone  200 mg Oral Daily  . amLODipine  2.5 mg Oral Daily  . aspirin  81 mg Oral Daily  . cefTRIAXone (ROCEPHIN)  IV  1 g Intravenous Q24H  . diltiazem  300 mg Oral Daily  . donepezil  10 mg Oral QHS  . ezetimibe  10 mg Oral Daily  . gabapentin  100 mg Oral BID  . mirabegron ER  25 mg Oral QHS  . pantoprazole  40 mg Oral Q1200  . pyridOXINE  100 mg Oral Daily  . warfarin  5 mg Oral ONCE-1800  . Warfarin - Pharmacist Dosing  Inpatient   Does not apply q1800   Continuous Infusions: . sodium chloride 75 mL/hr at 10/28/13 1741    Active Problems:   Dehydration   UTI (urinary tract infection)   Nausea and vomiting   Closed fibular fracture    Time spent: 27min    [facility] Pager [phone]. If 7PM-7AM, please contact night-coverage at [email or web address], password [facility] 10/29/2013, 2:06 PM  LOS: 1 day

## 2013-10-29 NOTE — Progress Notes (Signed)
INITIAL NUTRITION ASSESSMENT  DOCUMENTATION CODES Per approved criteria  -Not Applicable   INTERVENTION: - Boost Plus BID - Encouraged increased meal intake as nausea resolves - Will continue to monitor   NUTRITION DIAGNOSIS: Unintended weight loss related to poor appetite, nausea, vomiting, diarrhea as evidenced by pt and RN report.   Goal: 1. Resolution of diarrhea and nausea 2. Pt to consume >90% of meals/supplements  Monitor:  Weights, labs, intake, diarrhea, nausea   Reason for Assessment: Malnutrition screening tool   80 y.o. female  Admitting Dx: Dehydration   ASSESSMENT: Pt with hx of Atrial fibrillation on coumadin, HTN, GERD suffered a recent ankle/fibular fracture on 1/24 following a fall, she then stayed briefly at the Rehab part of [facility] home and went back home to [facility] living yesterday. She reports eating some fish for dinner Wednesday evening and after this started experiencing nausea and vomiting, 3-4 episodes of vomiting, non bloodly and non bilious. She denied any diarrhea, abdominal pain, fever or chills on admission. Endorsed some urinary frequency and weakness.  Due to persistent weakness came to the ER yesterday, last episode of vomiting was earlier yesterday morning.  Met with pt and daughter who report pt had some nausea this morning but is resolved now and deny any vomiting today. Pt with 4 episodes of diarrhea so far today per RN. Pt reports before this episode occurred, her appetite was poor for 3-4 days with pt consuming only 1 meal and 1 Boost per day. States she's lost 3 pounds in the past month. Denies any problems chewing or swallowing.   Nutrition Focused Physical Exam:  Subcutaneous Fat:  Orbital Region: WNL Upper Arm Region: WNL Thoracic and Lumbar Region: WNL  Muscle:  Temple Region: WNL Clavicle Bone Region: WNL Clavicle and Acromion Bone Region: WNL Scapular Bone Region: NA Dorsal Hand: WNL Patellar Region: NA Anterior Thigh  Region: NA Posterior Calf Region: NA  Edema: None noted   Height: Ht Readings from Last 1 Encounters:  10/28/13 $RemoveB'5\' 6"'ZfpCMXpi$  (1.676 m)    Weight: Wt Readings from Last 1 Encounters:  10/28/13 134 lb (60.782 kg)    Ideal Body Weight: 130 lb   % Ideal Body Weight: 103%  Wt Readings from Last 10 Encounters:  10/28/13 134 lb (60.782 kg)  10/23/13 134 lb (60.782 kg)  09/14/13 136 lb (61.689 kg)  08/19/13 135 lb (61.236 kg)  08/03/13 136 lb (61.689 kg)  07/17/13 133 lb 1.6 oz (60.374 kg)  07/05/13 136 lb (61.689 kg)  04/12/13 140 lb 12.8 oz (63.866 kg)  03/10/13 141 lb 6.4 oz (64.139 kg)  12/29/12 146 lb (66.225 kg)    Usual Body Weight: 137 lb per pt  % Usual Body Weight: 98%  BMI:  Body mass index is 21.64 kg/(m^2).  Estimated Nutritional Needs: Kcal: 1500-1700 Protein: 70-80g Fluid: 1.5-1.7L/day  Skin: Intact   Diet Order: General  EDUCATION NEEDS: -No education needs identified at this time   Intake/Output Summary (Last 24 hours) at 10/29/13 1052 Last data filed at 10/29/13 0921  Gross per 24 hour  Intake   1525 ml  Output      5 ml  Net   1520 ml    Last BM: 1/29  Labs:   Recent Labs Lab 10/28/13 1030 10/29/13 0510  NA 141 142  K 4.2 3.9  CL 104 109  CO2 21 23  BUN 25* 15  CREATININE 0.84 0.86  CALCIUM 8.9 8.0*  GLUCOSE 129* 89    CBG (last 3)  No results found for this basename: GLUCAP,  in the last 72 hours  Scheduled Meds: . amiodarone  200 mg Oral Daily  . amLODipine  2.5 mg Oral Daily  . aspirin  81 mg Oral Daily  . cefTRIAXone (ROCEPHIN)  IV  1 g Intravenous Q24H  . diltiazem  300 mg Oral Daily  . donepezil  10 mg Oral QHS  . ezetimibe  10 mg Oral Daily  . gabapentin  100 mg Oral BID  . mirabegron ER  25 mg Oral QHS  . pantoprazole  40 mg Oral Q1200  . pyridOXINE  100 mg Oral Daily  . warfarin  5 mg Oral ONCE-1800  . Warfarin - Pharmacist Dosing Inpatient   Does not apply q1800    Continuous Infusions: . sodium chloride  75 mL/hr at 10/28/13 1741    Past Medical History  Diagnosis Date  . Esophageal stricture   . Hypertension   . Atrial fibrillation   . Cerebrovascular disease, unspecified   . Hyperlipidemia   . DJD (degenerative joint disease)   . Unspecified disorder of kidney and ureter   . Renal cancer   . Osteoarthrosis, unspecified whether generalized or localized, unspecified site   . Generalized anxiety disorder   . GERD (gastroesophageal reflux disease)   . Hiatal hernia   . PN (peripheral neuropathy)     in feet  . Rectal fissure   . Gastric ulcer   . IBS (irritable bowel syndrome)   . Insomnia   . Adenomatous colon polyp 2013  . Diverticulosis   . Skin cancer of nose   . Hx of cardiovascular stress test     Lexiscan Myoview (07/2013): No ischemia, EF 69%, normal study  . PONV (postoperative nausea and vomiting)     Past Surgical History  Procedure Laterality Date  . Appendectomy    . Cholecystectomy    . Bunionectomies       BILATERAL  . Anterior and posterior vaginal repair  1989    AP REPAIR & RAZ URETHROPEXY  . S/p percutaneous cryoablation of right renal cell cancer  07/2009    by [name]  . Partial hysterectomy  [place], [place], [place] Pager [phone] After Hours Pager

## 2013-10-30 DIAGNOSIS — I679 Cerebrovascular disease, unspecified: Secondary | ICD-10-CM

## 2013-10-30 LAB — URINE CULTURE

## 2013-10-30 LAB — CBC
HCT: 35.2 % — ABNORMAL LOW (ref 36.0–46.0)
Hemoglobin: 11.4 g/dL — ABNORMAL LOW (ref 12.0–15.0)
MCH: 31.3 pg (ref 26.0–34.0)
MCHC: 32.4 g/dL (ref 30.0–36.0)
MCV: 96.7 fL (ref 78.0–100.0)
PLATELETS: 161 10*3/uL (ref 150–400)
RBC: 3.64 MIL/uL — AB (ref 3.87–5.11)
RDW: 12.9 % (ref 11.5–15.5)
WBC: 7.4 10*3/uL (ref 4.0–10.5)

## 2013-10-30 LAB — BASIC METABOLIC PANEL
BUN: 9 mg/dL (ref 6–23)
CALCIUM: 8.3 mg/dL — AB (ref 8.4–10.5)
CHLORIDE: 106 meq/L (ref 96–112)
CO2: 26 meq/L (ref 19–32)
Creatinine, Ser: 0.84 mg/dL (ref 0.50–1.10)
GFR calc Af Amer: 70 mL/min — ABNORMAL LOW (ref >90)
GFR calc non Af Amer: 61 mL/min — ABNORMAL LOW (ref >90)
Glucose, Bld: 98 mg/dL (ref 70–99)
Potassium: 3.5 mEq/L — ABNORMAL LOW (ref 3.7–5.3)
SODIUM: 142 meq/L (ref 137–147)

## 2013-10-30 LAB — PROTIME-INR
INR: 2.52 — AB (ref 0.00–1.49)
PROTHROMBIN TIME: 26.3 s — AB (ref 11.6–15.2)

## 2013-10-30 MED ORDER — ZOLPIDEM TARTRATE 10 MG PO TABS
10.0000 mg | ORAL_TABLET | Freq: Every evening | ORAL | Status: DC | PRN
  Administered 2013-10-30: 10 mg via ORAL
  Filled 2013-10-30: qty 1

## 2013-10-30 MED ORDER — WARFARIN SODIUM 4 MG PO TABS
4.0000 mg | ORAL_TABLET | Freq: Once | ORAL | Status: AC
  Administered 2013-10-30: 4 mg via ORAL
  Filled 2013-10-30: qty 1

## 2013-10-30 MED ORDER — ONDANSETRON HCL 4 MG PO TABS
4.0000 mg | ORAL_TABLET | Freq: Three times a day (TID) | ORAL | Status: DC | PRN
  Administered 2013-10-30: 4 mg via ORAL

## 2013-10-30 MED ORDER — CIPROFLOXACIN HCL 250 MG PO TABS
250.0000 mg | ORAL_TABLET | Freq: Two times a day (BID) | ORAL | Status: DC
  Administered 2013-10-30 – 2013-10-31 (×2): 250 mg via ORAL
  Filled 2013-10-30 (×4): qty 1

## 2013-10-30 NOTE — Progress Notes (Signed)
[facility] PROGRESS NOTE  Anne Hayes DPO:[id number] DOB: 04-19-1924 DOA: 10/28/2013 PCP: Mathews Argyle, MD  Assessment/Plan: 1. Nausea/vomiting./diarrhea -likely viral gastroenteritis -improved, no further diarrhea -Cdiff PCR negative -tolerating regular diet, stop IVF  2. UTI -continue ceftriaxone,  -Urine Cultures with Lucianne Muss, Fu sensitvity  3. Recent Ankle fracture  -pain control, non operative mgt  -continue ACE wrap, Boot for mobility   3. P.Afib  -rate controlled  -continue cardizem, amiodarone, and coumadin  -QTC prolonged, avoid quinolones or other meds that worsen this, repeat EKG pending   4. HTN  -stable, continue amlodipine   5. Mild cognitive dysfunction/memory loss  -continue aricept   DVT proph: on anticoagulation   Code Status: Full Code Family Communication: none at bedside, called and left msg for dtr Disposition Plan: SNF tomorrow if stable   Antibiotics:  ceftriaxone  HPI/Subjective: Urinating a lot overnight, didn't sleep much as a result  Objective: Filed Vitals:   10/30/13 0641  BP: 151/60  Pulse: 59  Temp: 98.4 F (36.9 C)  Resp: 20    Intake/Output Summary (Last 24 hours) at 10/30/13 0853 Last data filed at 10/30/13 0600  Gross per 24 hour  Intake   2400 ml  Output   1751 ml  Net    649 ml   Filed Weights   10/28/13 1709  Weight: 60.782 kg (134 lb)    Exam:   General:  AAOx3, no distress  Cardiovascular: S1S2/RRR  Respiratory: fine basilar crackles  Abdomen: soft, Nt, mildly distended, BS present  Musculoskeletal: no edema c/c  Data Reviewed: Basic Metabolic Panel:  Recent Labs Lab 10/28/13 1030 10/29/13 0510 10/30/13 0544  NA 141 142 142  K 4.2 3.9 3.5*  CL 104 109 106  CO2 21 23 26   GLUCOSE 129* 89 98  BUN 25* 15 9  CREATININE 0.84 0.86 0.84  CALCIUM 8.9 8.0* 8.3*   Liver Function Tests:  Recent Labs Lab 10/28/13 1030  AST 27  ALT 34  ALKPHOS 67  BILITOT 0.7  PROT 7.1   ALBUMIN 3.6   No results found for this basename: LIPASE, AMYLASE,  in the last 168 hours No results found for this basename: AMMONIA,  in the last 168 hours CBC:  Recent Labs Lab 10/28/13 1030 10/29/13 0510 10/30/13 0544  WBC 11.6* 6.4 7.4  HGB 13.5 11.3* 11.4*  HCT 40.3 34.3* 35.2*  MCV 96.4 96.6 96.7  PLT 163 162 161   Cardiac Enzymes:  Recent Labs Lab 10/28/13 1030  TROPONINI <0.30   BNP (last 3 results) No results found for this basename: PROBNP,  in the last 8760 hours CBG: No results found for this basename: GLUCAP,  in the last 168 hours  Recent Results (from the past 240 hour(s))  URINE CULTURE     Status: None   Collection Time    10/28/13 12:53 PM      Result Value Range Status   Specimen Description URINE, CLEAN CATCH   Final   Special Requests NONE   Final   Culture  Setup Time     Final   Value: 10/28/2013 20:42     Performed at [facility]     Final   Value: >=100,000 COLONIES/ML     Performed at [facility]   Culture     Final   Value: ESCHERICHIA COLI     Performed at [facility]   Report Status PENDING   Incomplete  CLOSTRIDIUM DIFFICILE BY  PCR     Status: None   Collection Time    10/29/13  9:12 AM      Result Value Range Status   C difficile by pcr NEGATIVE  NEGATIVE Final   Comment: Performed at [facility]     Studies: Ct Head Wo Contrast  10/28/2013   CLINICAL DATA:  Fall.  Coumadin therapy.  EXAM: CT HEAD WITHOUT CONTRAST  TECHNIQUE: Contiguous axial images were obtained from the base of the skull through the vertex without intravenous contrast.  COMPARISON:  CT 11/03/2012.  FINDINGS: No mass. No hydrocephalus. No hemorrhage. Mild white matter changes noted consistent with chronic ischemia. Previously identified left frontal soft tissue swelling has resolved. The visualized paranasal sinuses and mastoids are clear. No acute bony abnormality.  IMPRESSION: Chronic white matter changes  consistent with chronic small vessel ischemia. No acute abnormality.   Electronically Signed   By: Marcello Moores  Register   On: 10/28/2013 10:29    Scheduled Meds: . amiodarone  200 mg Oral Daily  . amLODipine  2.5 mg Oral Daily  . aspirin  81 mg Oral Daily  . cefTRIAXone (ROCEPHIN)  IV  1 g Intravenous Q24H  . diltiazem  300 mg Oral Daily  . donepezil  10 mg Oral QHS  . ezetimibe  10 mg Oral Daily  . gabapentin  100 mg Oral BID  . lactose free nutrition  237 mL Oral BID BM  . mirabegron ER  25 mg Oral QHS  . pantoprazole  40 mg Oral Q1200  . pyridOXINE  100 mg Oral Daily  . warfarin  5 mg Oral ONCE-1800  . Warfarin - Pharmacist Dosing Inpatient   Does not apply q1800   Continuous Infusions:    Active Problems:   Dehydration   UTI (urinary tract infection)   Nausea and vomiting   Closed fibular fracture    Time spent: 21min    [facility] Pager [phone]. If 7PM-7AM, please contact night-coverage at [email or web address], password [facility] 10/30/2013, 8:53 AM  LOS: 2 days

## 2013-10-30 NOTE — Progress Notes (Signed)
Per MD, Pt not ready for d/c today.  CSW to follow.  Bernita Raisin, [place] Work [phone]

## 2013-10-30 NOTE — Progress Notes (Signed)
ANTICOAGULATION CONSULT NOTE - Follow Up  Pharmacy Consult for Coumadin Indication: atrial fibrillation  Allergies  Allergen Reactions  . Other Nausea And Vomiting    Any kind of BELL PEPPER.   Extreme, violent nausea and vomiting quickly leading to dehydration  . Codeine Nausea And Vomiting       . Penicillins Nausea And Vomiting  . Sulfa Antibiotics Itching   Patient Measurements: Height: 5\' 6"  (167.6 cm) Weight: 134 lb (60.782 kg) IBW/kg (Calculated) : 59.3  Vital Signs: Temp: 98.4 F (36.9 C) (01/31 0641) Temp src: Oral (01/31 0641) BP: 151/60 mmHg (01/31 0641) Pulse Rate: 59 (01/31 0641)  Labs:  Recent Labs  10/28/13 1030 10/28/13 1505 10/29/13 0510 10/30/13 0544  HGB 13.5  --  11.3* 11.4*  HCT 40.3  --  34.3* 35.2*  PLT 163  --  162 161  LABPROT  --  21.6* 22.8* 26.3*  INR  --  1.94* 2.09* 2.52*  CREATININE 0.84  --  0.86 0.84  TROPONINI <0.30  --   --   --    Medications:  Scheduled:  . amiodarone  200 mg Oral Daily  . amLODipine  2.5 mg Oral Daily  . aspirin  81 mg Oral Daily  . cefTRIAXone (ROCEPHIN)  IV  1 g Intravenous Q24H  . diltiazem  300 mg Oral Daily  . donepezil  10 mg Oral QHS  . ezetimibe  10 mg Oral Daily  . gabapentin  100 mg Oral BID  . lactose free nutrition  237 mL Oral BID BM  . mirabegron ER  25 mg Oral QHS  . pantoprazole  40 mg Oral Q1200  . pyridOXINE  100 mg Oral Daily  . warfarin  5 mg Oral ONCE-1800  . Warfarin - Pharmacist Dosing Inpatient   Does not apply q1800   Inpatient warfarin doses administered from 1/29 - 7.5mg , 5mg   Assessment: 80yo F admitted with N/V and UTI. Takes Coumadin for A.fib. Pharmacy is asked to dose. Home regimen: 5mg  daily, except 7.5mg  Wed/Sat. Last dose was taken on 1/28(Wed) - patient states she took 5mg . Concurrent amiodarone and aspirin noted.  INR was slightly subtherapeutic on admission.   1/31:  INR therapeutic at 2.52, quick increase in INR  No bleeding reported.  Hgb and Hct down  slightly. Eating 50% diet, diarrhea resolved, Cdiff negative.  Concomitant ceftriaxone noted.  Antibiotics can increase INR on warfarin but the effect from cephalosporins is typically not large.   Goal of Therapy:  INR 2-3   Plan:   Warfarin 4mg  today, patient prefers dose at 10pm  Daily PT/INR while inpatient.  Minda Ditto PharmD Pager [phone] 10/30/2013, 9:55 AM

## 2013-10-30 NOTE — Progress Notes (Signed)
Pt has been using the bathroom about every 90 minutes during the night. She has voided over 1600 ml during this shift.  She has not been able to sleep.  IV pulled out around 5 am.  Pt. Does not want it put back in.  She states she is gong home.  Will discuss with day shift and if need will talk with pt about needing it for one more day.

## 2013-10-30 NOTE — Progress Notes (Signed)
Attempted starting an IV x 2; but unsuccessful. Great bllod return noted both times; but when tried to flush, veins blew. Pt tolerated iv attempts. IV notified for restart. Vwilliams,rn.

## 2013-10-31 LAB — PROTIME-INR
INR: 2.4 — ABNORMAL HIGH (ref 0.00–1.49)
PROTHROMBIN TIME: 25.4 s — AB (ref 11.6–15.2)

## 2013-10-31 MED ORDER — WARFARIN SODIUM 4 MG PO TABS
4.0000 mg | ORAL_TABLET | Freq: Once | ORAL | Status: DC
  Filled 2013-10-31: qty 1

## 2013-10-31 MED ORDER — CIPROFLOXACIN HCL 250 MG PO TABS: 250.0000 mg | ORAL_TABLET | Freq: Two times a day (BID) | ORAL | Status: DC

## 2013-10-31 NOTE — Progress Notes (Signed)
ANTICOAGULATION CONSULT NOTE - Follow Up  Pharmacy Consult for Coumadin Indication: atrial fibrillation  Allergies  Allergen Reactions  . Other Nausea And Vomiting    Any kind of BELL PEPPER.   Extreme, violent nausea and vomiting quickly leading to dehydration  . Codeine Nausea And Vomiting       . Penicillins Nausea And Vomiting  . Sulfa Antibiotics Itching   Patient Measurements: Height: 5\' 6"  (167.6 cm) Weight: 134 lb (60.782 kg) IBW/kg (Calculated) : 59.3  Vital Signs: Temp: 97.9 F (36.6 C) (02/01 0543) Temp src: Oral (02/01 0543) BP: 146/65 mmHg (02/01 0543) Pulse Rate: 56 (02/01 0543)  Labs:  Recent Labs  10/28/13 1030  10/29/13 0510 10/30/13 0544 10/31/13 0548  HGB 13.5  --  11.3* 11.4*  --   HCT 40.3  --  34.3* 35.2*  --   PLT 163  --  162 161  --   LABPROT  --   < > 22.8* 26.3* 25.4*  INR  --   < > 2.09* 2.52* 2.40*  CREATININE 0.84  --  0.86 0.84  --   TROPONINI <0.30  --   --   --   --   < > = values in this interval not displayed. Medications:  Scheduled:  . amiodarone  200 mg Oral Daily  . amLODipine  2.5 mg Oral Daily  . aspirin  81 mg Oral Daily  . ciprofloxacin  250 mg Oral BID  . diltiazem  300 mg Oral Daily  . donepezil  10 mg Oral QHS  . ezetimibe  10 mg Oral Daily  . gabapentin  100 mg Oral BID  . lactose free nutrition  237 mL Oral BID BM  . mirabegron ER  25 mg Oral QHS  . pantoprazole  40 mg Oral Q1200  . pyridOXINE  100 mg Oral Daily  . Warfarin - Pharmacist Dosing Inpatient   Does not apply q1800   Inpatient warfarin doses administered from 1/29 - 7.5mg , 5mg , 4mg   Assessment: 80yo F admitted with N/V and UTI. Takes Coumadin for A.fib. Pharmacy is asked to dose. Home regimen: 5mg  daily, except 7.5mg  Wed/Sat. Last dose was taken on 1/28(Wed) - patient states she took 5mg . Concurrent amiodarone and aspirin noted.  INR was slightly subtherapeutic on admission.   2/1:  INR therapeutic at 2.40  No bleeding reported.  Hgb and  Hct down slightly. Eating 50% diet, emesis x1 last night, diarrhea resolved, Cdiff negative.  Concomitant Cipro noted. Quinolones can increase INR on warfarin    Goal of Therapy:  INR 2-3   Plan:   Warfarin 4mg  today, patient prefers dose at 10pm  Daily PT/INR while inpatient.  Minda Ditto PharmD Pager [phone] 10/31/2013, 9:20 AM

## 2013-10-31 NOTE — Progress Notes (Signed)
Per MD, Pt ready for d/c.  Notified RN, Pt, Pt's daughter at bedside and facility.  Sent d/c summary.  Confirmed receipt of d/c summary.  Facility ready to receive Pt.  Daughter to provide transportation.  Bernita Raisin, [place] Work [phone]

## 2013-10-31 NOTE — Progress Notes (Signed)
Pt has slept well during the night. Up twice using the [facility] with one person assistance for safety. No more N/V noted

## 2013-10-31 NOTE — Progress Notes (Signed)
Pt had episode of emesis.  Stated she did not eat much today and then took pain meds at 1900 for headache.  Gave Zofran with good results.

## 2013-10-31 NOTE — Progress Notes (Signed)
Pt discharged to rehab at [facility]. Report given to [facility], nurse at rehab facility. All questions answered to nurse's satisfaction. Pt's daughter in to transport pt to rehab facility. Discharge packet given to dtr to take to facility. Pt left unit in wheelchair pushed by nurse tech. Left in good condition accompanied by dtr. No concerns voiced. Vwilliams,rn.

## 2013-10-31 NOTE — Discharge Summary (Signed)
Physician Discharge Summary  Anne Hayes UKG:[id number] DOB: 1924-01-21 DOA: 10/28/2013  PCP: Anne Argyle, MD  Admit date: 10/28/2013 Discharge date: 10/31/2013  Time spent: 45 minutes  Recommendations for Outpatient Follow-up:  1. PCP in 1 week 2. INR check 2/2  Discharge Diagnoses:    Dehydration   Ecoli UTI    Nausea and vomiting   Closed fibular fracture   Chronic afib on coumadin   Memory loss   HTN  Discharge Condition: stable  Diet recommendation: low sodium  Filed Weights   10/28/13 1709  Weight: 60.782 kg (134 lb)    History of present illness:  HPI: Anne Hayes is a 80 y.o. female with PMH of Afib on coumadin, HTN, GERD suffered a recent ankle/fibular fracture on 1/24 following a fall, she then stayed briefly at [facility] and went back home to [facility] yesterday.  He reports eating some fish for dinner yesterday evening and after this started experiencing nausea and vomiting, 3-4 episodes of vomiting, non bloodly and non bilious.  She denies any diarrhea, abd pain, fever or chills.  Endorses some urinary frequency and weakness.  Due to persistent weakness came to the ER today, last episode of vomiting was earlier this am.  In ER, vitals stable, UA concerning for UTI, and persistent nausea despite anti-emetic and Po trial and hence [facility] consulted for further management.   Hospital Course:  1. Nausea/vomiting./diarrhea  -likely viral gastroenteritis  -improved, with supportive care, no further diarrhea  -Cdiff PCR negative  -tolerating regular diet, stopped IVF   2. UTI  -treated with IV ceftriaxone then transitioned to Po ciprofloxacin -Urine Cultures with Ecoli,  -needs 2 more days of CIprofloxacin  3. Recent Ankle fracture  -pain control, non operative mgt  -continue ACE wrap, Boot for mobility   3. P.Afib  -rate controlled  -continue cardizem, amiodarone, and coumadin   4. HTN  -stable, continue  amlodipine   5. Mild cognitive dysfunction/memory loss  -continue aricept    Discharge Exam: Filed Vitals:   10/31/13 0543  BP: 146/65  Pulse: 56  Temp: 97.9 F (36.6 C)  Resp: 18    General: AAOx3 Cardiovascular: S1S2/RRR Respiratory: CTAB  Discharge Instructions  Discharge Orders   Future Appointments Provider Department Dept Phone   11/04/2013 12:00 PM Noralee Space, MD [facility] [phone]   Future Orders Complete By Expires   Diet - low sodium heart healthy  As directed    Diet - low sodium heart healthy  As directed    Increase activity slowly  As directed    Increase activity slowly  As directed        Medication List         AMBIEN 10 MG tablet  Generic drug:  zolpidem  Take 10 mg by mouth at bedtime as needed. For sleep     amiodarone 200 MG tablet  Commonly known as:  PACERONE  TAKE ONE TABLET BY MOUTH ONCE DAILY     amLODipine 2.5 MG tablet  Commonly known as:  NORVASC  Take 2.5 mg by mouth daily.     aspirin 81 MG chewable tablet  Chew 81 mg by mouth daily.     cetirizine 10 MG tablet  Commonly known as:  ZYRTEC  Take 10 mg by mouth daily.     ciprofloxacin 250 MG tablet  Commonly known as:  CIPRO  Take 1 tablet (250 mg total) by mouth 2 (two) times daily.  For 2 days     Co Q-10 100 MG Caps  Take 100 mg by mouth daily.     diltiazem 300 MG 24 hr capsule  Commonly known as:  CARDIZEM CD  TAKE ONE CAPSULE BY MOUTH ONCE DAILY     donepezil 10 MG tablet  Commonly known as:  ARICEPT  Take 10 mg by mouth at bedtime.     ezetimibe 10 MG tablet  Commonly known as:  ZETIA  Take 10 mg by mouth daily.     FISH OIL PO  Take 1 tablet by mouth daily.     gabapentin 100 MG capsule  Commonly known as:  NEURONTIN  Take 100 mg by mouth 2 (two) times daily.     MYRBETRIQ 25 MG Tb24 tablet  Generic drug:  mirabegron ER  Take 25 mg by mouth at bedtime.     pravastatin 40 MG tablet  Commonly known as:  PRAVACHOL  Take 40 mg by  mouth at bedtime.     pyridOXINE 100 MG tablet  Commonly known as:  VITAMIN B-6  Take 100 mg by mouth daily.     sodium chloride 0.65 % nasal spray  Commonly known as:  OCEAN  Place 2 sprays into the nose as needed. For dry nose     VITAMIN D PO  Take 1 tablet by mouth daily.     warfarin 5 MG tablet  Commonly known as:  COUMADIN  Take 5-7.5 mg by mouth daily. Takes 5 mg daily, except Wednesday and Saturday, then takes 7.5mg        Allergies  Allergen Reactions  . Other Nausea And Vomiting    Any kind of BELL PEPPER.   Extreme, violent nausea and vomiting quickly leading to dehydration  . Codeine Nausea And Vomiting       . Penicillins Nausea And Vomiting  . Sulfa Antibiotics Itching       Follow-up Information   Follow up with Anne Argyle, MD. Schedule an appointment as soon as possible for a visit in 1 week.   Specialty:  Internal Medicine   Contact information:   [address] [place] [address] [phone]        The results of significant diagnostics from this hospitalization (including imaging, microbiology, ancillary and laboratory) are listed below for reference.    Significant Diagnostic Studies: Dg Ankle Complete Right  10/24/2013   CLINICAL DATA:  Fall, pain and swelling  EXAM: RIGHT ANKLE - COMPLETE 3+ VIEW  COMPARISON:  None.  FINDINGS: There is lateral malleolar soft tissue swelling with an underlying nondisplaced oblique distal left fibular fracture. Ankle mortise is symmetric. No radiopaque foreign body.  IMPRESSION: Nondisplaced distal left fibular fracture with overlying soft tissue swelling.   Electronically Signed   By: Conchita Paris M.D.   On: 10/24/2013 11:13   Ct Head Wo Contrast  10/28/2013   CLINICAL DATA:  Fall.  Coumadin therapy.  EXAM: CT HEAD WITHOUT CONTRAST  TECHNIQUE: Contiguous axial images were obtained from the base of the skull through the vertex without intravenous contrast.  COMPARISON:  CT  11/03/2012.  FINDINGS: No mass. No hydrocephalus. No hemorrhage. Mild white matter changes noted consistent with chronic ischemia. Previously identified left frontal soft tissue swelling has resolved. The visualized paranasal sinuses and mastoids are clear. No acute bony abnormality.  IMPRESSION: Chronic white matter changes consistent with chronic small vessel ischemia. No acute abnormality.   Electronically Signed   By: Marcello Moores  Register  On: 10/28/2013 10:29    Microbiology: Recent Results (from the past 240 hour(s))  URINE CULTURE     Status: None   Collection Time    10/28/13 12:53 PM      Result Value Range Status   Specimen Description URINE, CLEAN CATCH   Final   Special Requests NONE   Final   Culture  Setup Time     Final   Value: 10/28/2013 20:42     Performed at [facility]     Final   Value: >=100,000 COLONIES/ML     Performed at [facility]   Culture     Final   Value: ESCHERICHIA COLI     Performed at [facility]   Report Status 10/30/2013 FINAL   Final   Organism ID, Bacteria ESCHERICHIA COLI   Final  CLOSTRIDIUM DIFFICILE BY PCR     Status: None   Collection Time    10/29/13  9:12 AM      Result Value Range Status   C difficile by pcr NEGATIVE  NEGATIVE Final   Comment: Performed at [facility]: Basic Metabolic Panel:  Recent Labs Lab 10/28/13 1030 10/29/13 0510 10/30/13 0544  NA 141 142 142  K 4.2 3.9 3.5*  CL 104 109 106  CO2 21 23 26   GLUCOSE 129* 89 98  BUN 25* 15 9  CREATININE 0.84 0.86 0.84  CALCIUM 8.9 8.0* 8.3*   Liver Function Tests:  Recent Labs Lab 10/28/13 1030  AST 27  ALT 34  ALKPHOS 67  BILITOT 0.7  PROT 7.1  ALBUMIN 3.6   No results found for this basename: LIPASE, AMYLASE,  in the last 168 hours No results found for this basename: AMMONIA,  in the last 168 hours CBC:  Recent Labs Lab 10/28/13 1030 10/29/13 0510 10/30/13 0544  WBC 11.6* 6.4 7.4  HGB 13.5  11.3* 11.4*  HCT 40.3 34.3* 35.2*  MCV 96.4 96.6 96.7  PLT 163 162 161   Cardiac Enzymes:  Recent Labs Lab 10/28/13 1030  TROPONINI <0.30   BNP: BNP (last 3 results) No results found for this basename: PROBNP,  in the last 8760 hours CBG: No results found for this basename: GLUCAP,  in the last 168 hours     Signed:  Leonardo Plaia  [facility] 10/31/2013, 10:29 AM

## 2013-11-04 ENCOUNTER — Ambulatory Visit: Payer: Medicare Other | Admitting: Pulmonary Disease

## 2013-11-08 LAB — PROTIME-INR: INR: 3.2 — AB (ref 0.9–1.1)

## 2013-11-09 ENCOUNTER — Ambulatory Visit (INDEPENDENT_AMBULATORY_CARE_PROVIDER_SITE_OTHER): Payer: Medicare Other | Admitting: Pharmacist

## 2013-11-09 ENCOUNTER — Encounter: Payer: Self-pay | Admitting: Pulmonary Disease

## 2013-11-09 DIAGNOSIS — Z7901 Long term (current) use of anticoagulants: Secondary | ICD-10-CM

## 2013-11-09 DIAGNOSIS — Z5181 Encounter for therapeutic drug level monitoring: Secondary | ICD-10-CM

## 2013-11-09 DIAGNOSIS — I4891 Unspecified atrial fibrillation: Secondary | ICD-10-CM

## 2013-11-18 ENCOUNTER — Telehealth: Payer: Self-pay | Admitting: Pulmonary Disease

## 2013-11-18 NOTE — Telephone Encounter (Signed)
Please advise Leigh where pt can be worked in at? thanks

## 2013-11-18 NOTE — Telephone Encounter (Signed)
Ok to use 2/25 at 2:30.  thanks

## 2013-11-18 NOTE — Telephone Encounter (Signed)
Ok to schedule the pt for 4-3

## 2013-11-18 NOTE — Telephone Encounter (Signed)
Pt needed afternoon. appt scheduled. Nothing further needed

## 2013-11-18 NOTE — Telephone Encounter (Signed)
Please advise where pt can be worked in at? thanks 

## 2013-11-18 NOTE — Telephone Encounter (Signed)
lmtcb x1 

## 2013-11-19 ENCOUNTER — Telehealth: Payer: Self-pay | Admitting: Physician Assistant

## 2013-11-19 NOTE — Telephone Encounter (Signed)
lmom that appt s/c here in office since pt is now home and no longer at [facility]. Since she was scheduled for 2/17 INR check and pt could not get here today, we scheduled her for Monday, 11/22/13.

## 2013-11-19 NOTE — Telephone Encounter (Signed)
New Problem:  Pt's daughter, Mrs. Apple, called to find out when the pt's next coumadin level will be checked... It looks like her coumadin is being checked at [facility]... I did not tell the daughter that.. Pt's daughter is requesting a call back from a coumadin nurse.

## 2013-11-19 NOTE — Telephone Encounter (Signed)
atc na x1 

## 2013-11-22 ENCOUNTER — Ambulatory Visit (INDEPENDENT_AMBULATORY_CARE_PROVIDER_SITE_OTHER): Payer: Medicare Other

## 2013-11-22 ENCOUNTER — Telehealth: Payer: Self-pay | Admitting: Pulmonary Disease

## 2013-11-22 DIAGNOSIS — I4891 Unspecified atrial fibrillation: Secondary | ICD-10-CM

## 2013-11-22 DIAGNOSIS — Z7901 Long term (current) use of anticoagulants: Secondary | ICD-10-CM

## 2013-11-22 DIAGNOSIS — Z5181 Encounter for therapeutic drug level monitoring: Secondary | ICD-10-CM

## 2013-11-22 LAB — POCT INR: INR: 3.3

## 2013-11-22 NOTE — Telephone Encounter (Signed)
Duplicate message, appt has been set. [place] Bing, CMA

## 2013-11-22 NOTE — Telephone Encounter (Signed)
Please advise if pt can be worked in sooner SN? thanks

## 2013-11-22 NOTE — Telephone Encounter (Signed)
Called and spoke with pam, pts daughter and she will bring the pt in on 2/27 at 11:30.

## 2013-11-26 ENCOUNTER — Ambulatory Visit: Payer: Medicare Other | Admitting: Pulmonary Disease

## 2013-11-29 ENCOUNTER — Ambulatory Visit (INDEPENDENT_AMBULATORY_CARE_PROVIDER_SITE_OTHER): Payer: Medicare Other | Admitting: Pulmonary Disease

## 2013-11-29 ENCOUNTER — Ambulatory Visit (INDEPENDENT_AMBULATORY_CARE_PROVIDER_SITE_OTHER)
Admission: RE | Admit: 2013-11-29 | Discharge: 2013-11-29 | Disposition: A | Discharge status: Home / Self Care | Payer: Medicare Other | Source: Ambulatory Visit | Attending: Pulmonary Disease | Admitting: Pulmonary Disease

## 2013-11-29 ENCOUNTER — Encounter: Payer: Self-pay | Admitting: Pulmonary Disease

## 2013-11-29 VITALS — BP 128/60 | HR 52 | Temp 97.9°F | Ht 66.5 in | Wt 133.4 lb

## 2013-11-29 DIAGNOSIS — I10 Essential (primary) hypertension: Secondary | ICD-10-CM

## 2013-11-29 DIAGNOSIS — I4891 Unspecified atrial fibrillation: Secondary | ICD-10-CM

## 2013-11-29 DIAGNOSIS — F411 Generalized anxiety disorder: Secondary | ICD-10-CM

## 2013-11-29 DIAGNOSIS — J8410 Pulmonary fibrosis, unspecified: Secondary | ICD-10-CM

## 2013-11-29 DIAGNOSIS — R911 Solitary pulmonary nodule: Secondary | ICD-10-CM | POA: Insufficient documentation

## 2013-11-29 DIAGNOSIS — C649 Malignant neoplasm of unspecified kidney, except renal pelvis: Secondary | ICD-10-CM

## 2013-11-29 DIAGNOSIS — R413 Other amnesia: Secondary | ICD-10-CM

## 2013-11-29 DIAGNOSIS — E7800 Pure hypercholesterolemia, unspecified: Secondary | ICD-10-CM

## 2013-11-29 DIAGNOSIS — M1990 Unspecified osteoarthritis, unspecified site: Secondary | ICD-10-CM

## 2013-11-29 NOTE — Patient Instructions (Signed)
Today we updated your med list in our EPIC system...    Continue your current medications the same as directed by [name]...    Remember to bring all your med bottles and an updated list of medications to every doctor visit...  Today we did a follow up CXR... We will request a PET Scan for further evaluation of your lung nodules and to help [place] decide if we need to proceed w/ a lung biopsy...    We will contact you w/ the results when available...   Call for any questions.Marland KitchenMarland Kitchen

## 2013-11-29 NOTE — Progress Notes (Signed)
Subjective:    Patient ID: Anne Hayes, female    DOB: 23-Apr-1924, 80 y.o.   MRN: [id number]  HPI 80 y/o WF here for a follow up visit... she has multiple medical problems as noted below... She is followed by [name] for primary care, and we see her for pulm fibrosis.[name].  ~  May 14, 2011:  59mo ROV & referred by [name] for f/u of her chronic throat symptoms, cough, & interstitial lung disease on CXR> currently c/o cough x 3yr she says but really c/o clearing her throat a lot & these symptoms go back at least 61yrs w/ eval by ENT 2/11 DrRosen> laryngoscopy showed some edema & clinically felt to have LER & Rx w/ Omep 40mg /d but she was not regular w/ it & states it didn't help;  She had some mild basilar interstial changes on CXR back then but sl incr currently (1&1/2 yrs later), also has been on Amiodarone since 11/11 for difficult AFib...    She has had a very difficult time w/ her AFib trated by [name] & [name]> she had been tried on Flecainide, Sotalol, etc but either intol or ineffective; finally started on Amiodarone 11/11 & had 2 separate DCCV attempts but it didn't hold;  Finally had EPstudies by [name] w2/ RF ablation & has been holding NSR since then & the Amio was discontinued...    More recent eval from [name] for cough, assoc wheezing, hx bronchitis w/ CXR showing basilar ILD & no improvement after several rounds of antibiotics; interesting that she had run out of her Prevacid rx for GERD...    >> CXR 7/12 from [name] showed chronic interstitial markings towards the lower lobes & atherosclerosis in Ao...    >> We discussed the need for further eval w/ Collagen-Vasc screen, PFT, CT Chest;  If everything shows as expected she will need a vigorous Antireflux regimen to treat this condition...  CT Chest 8/12 >> stable emphysematous changes and basilar fibrotic changes; stable scattered pulm nodularlity w/ lobulated subpleural nodule in the right lower lobe measures 1.4  x 1.0 cm (unchanged); aortic atheromatous changes; no signif adenopathy; RF ablation changes to right kidney...  Office Spirometry 8/12 >> FVC= 2.23 (77%), FEV1= 1.81 (88%), %1sec=81, mid-flows= 148%pred >> no signif obstruction, poss mild restriction...  Labs >> WNL and neg collagen-vasc screen (CBC/ BMet= wnl, ANA=neg, RF=<10, Sed=25, ACE=37, IgE<1.5)...  ~  July 23, 2011:  43mo ROV & she has mult somatic complaints today> urine has foul odor, no dysuria, rec incr fluids & cranberry juice; c/o concern over her BP (it's 122/66 today), and LBP (chr problem, present for yrs, & has seen [name] & [name]), most recently seen by [facility] w/ adjustments & Tylenol for pain;  Also c/o memory prob & her daughter who accompanies her today wants her on meds, rec to start trial Aricept 5mg /d...    Throat symptoms> on MMW & using it 1-2 per day; she states choking episodes have stopped & throat feels better...    Pulm fibrosis> on Pulmicort & using Tussionex prn; states breathing is stable, at baseline, & she is exercising at the gym 2-3 days per week...    Reflux symptoms> on Prilosec20 before dinner, antireflux regimen, & Zantac Qhs; she denies reflux symptoms and states she is not doing these treatments regularly...  ~  December 25, 2011:  68mo ROV & she has mult rambling complaints> legs burning & recent neuropathy eval by [name] w/ NCVs that she says indicated "the start of  neuropathy"; "I can't sleep" yet she takes Ambien 10mg  nightly & rests fine, tolerates well, but worries about potential side effiects (discussed TylenolPM, Melatonin, other meds and she will discuss w/ [name]); she had Cards f/u w/ [name] & Allred "he wanted to change to Xarelto but too$$$"; also c/o intermittent fecal incont- doesn't have GYN & rec to f/u w/ DrDBrodie...    Pulm Fibrosis> only using MMW as needed, not on inhalers (she never took the Pulmicort), Tussionex, etc; she also stopped most of her prev antireflux Rx;  fortunately she is not symptomatic & denies CP, SOB, cough, sputum, etc; CXR/ CTChest/ PFTs/ etc from 8/12 reviewed; last imaging was CXR & CTAbd by [name] 12/12 w/ stable fibrosis pattern at bases...    HBP/ AFib> on Coumadin, Norvasc5, Cardizem60 prn; followed by [name] & [name]- their notes are reviewed...    Chol> on Prav80 & Zetia10 w/ labs followed by [name] (he is not on EPIC)...    GI> GERD, Hx constip, c/o intermittent fecal incont> we discussed Kaegel exercises 7 she will f/u w/ DrDBrodie...    Anxiety/ Insomnia/ Memory loss> on Aricept5 & Ambien10; she will discuss w/ [name] in f/u...  ~  June 29, 2012:  74mo ROV & Reid notes that her breathing is OK, stable DOE w/o change; she had a fall & went to ER w/ XRays- no fx etc;  She had 2 recent ER visits for abd discomfort & weakness, see labs below...     She suffers from chr constip w/ c/o leakage, eval by [name]- sphincter tone was wnl...    She had Colonoscopy 7/13 by DrDBrodie> mod divertics, sm 35mm polyp removed- tubular adenoma. We reviewed prob list, meds, xrays and labs> see below for updates >>   CXR & Right Ribs 9/13>  Borderline heart size, calcif tort Ao, diffuse ILD, basilar atx, osteopenia, no fx...  LABS 9/13:  Chems- wnl;  CBC- wnl;  Protime- ok followed in CC;  UA- clear   ~  December 29, 2012:  28mo ROV & [name] is here for a f/u of her pulm fibrosis> notes breathing is stable, has DOE w/ mod activity & no change from her baseline, had right CWP after fall 2/14- now resolved; she requests refill of her MMW & Tussionex... She also had bronchitis treated w/ ZPak 12/13- resolved back to baseline...    She saw [name] about 3wks ago by her hx but she did not bring in her recent med list & isn't sure of her drugs; she is reminded to carry an updated copy of his med list in her purse at all times...     She fell in her [facility] at [facility] 2/14> went to ER & that note is reviewed, she had left black eye & right  sided chest wall bruising;  CXR showed basilar scarring L>R, heart at upper lim of normal, NAD;  CT Head & CSpine- reviewed;  Labs- reviewed & INR=2.0;  Treated conservatively & spent 2 weeks in the Care unit at the [facility] before returning to her apt...    She saw [name] for f/u Renal cell cancer, s/p radiofreq ablation 4/10 & retreatment 11/10 w/ percut cryoablation; CT Abd 12/13 showed stable ablation changes in right kidney w/o recurrent tumor, cyst in lower pole left kid, dil of intrahep biliary ducts, remote healed right rib fxs, stable atherosclerotic changes in Ao & branches; he plans f/u 1 yr...    She saw [name] 12/13> maintaining NSR w/o dizziness, syncope,  palpit, etc; tolerating Amio & he decreased her dose to 100mg /d...    She saw GI, PaulaG 11/13 for f/u of her chr constip, but was c/o fecal incont; she had colonoscopy 7/13 by DrDBrodie showing mod sigm divertics, one sessile polyp= tubular adenoma; she was noted to be taking Miralax & fiber supplement, but KUB showed stool throughout right colon (prob overflow diarrhea) & rec to incr Miralax- symptoms improved...    We reviewed prob list, meds, xrays and labs>>   CXR 2/14 in ER showed basilar scarring L>R, heart at upper lim of normal, NAD...  ~  November 29, 2013:  43mo ROV & Mylia is followed by [name] for Primary Care, [facility] for urgent needs, [name] for Renal Cell Caner- s/p radiofreq ablation 4/10 followed by cryoablation11/10 & [name] for Cards...     She was [facility] by [facility] 1/15 after a fall w/ fx left fibula fx, then had some n&v, weakness- [facility] was found and hosp for antibiotics and fluids...  She has AFib w/ rate control & coumadin...    We have followed her for known pulm fibrosis (bibasilar scarring L>R) & RLL nodule w/o change serially (CTs in 2012 & 2014);  Dr Margaretmary Dys f/u CT Abd 12/14 showed no signs of recurrence at the right renal ablation site, and stable 36mm nodule at right base, but noted an incr density  RML area=> [name] ordered a dedicated CT Chest 12/14 which revealed mod emphysema, lower lobe interstitial fibrosis & bronchiectasis w/ mild honeycombing, stable 33mm right base nodule, and a new sub-solid nodule ~1.5cm in RML...  She returns today to review & discuss these findings> The 45mm nodule at the right lung base is unchanged and likely benign, the new right mid zone lesion is suspicious & could be a carcinoma... I suggest we proceed w/ PET Scan to see if needle bx is worth the risk...  We reviewed prob list, meds, xrays and labs> see below for updates >>   CT Chest 12/14 per [name] showed mod emphysema, bilat lower lobe fibrosis & traction bronchiectasis w/mild honeycombing, 1.3cm RLL nodule (no ch from 2012), new suspicious appearing RML subsolid nodule ~1.5cm, no adenopathy, atherosclerotic dis as prev.   CXR 3/15 showed low lung volumes, cardiomeg & atherosclerotic changes in Ao, fibrosis & bronchiectasis at bases w/ 1.4cm nodule right lung base, no other lesion seen...  PET CT 3/15 showed 2.0 x 2.9 cm subsolid nodule in the posterior right upper lobe with associated 7 mm solid component, max SUV 2.8, (this has continued to increase from prior CT and is worrisome for carcinoma); 1.4 x 1.1 cm solid posterior right lower lobe nodule without appreciable hypermetabolism, max SUV 1.4; Underlying mild to moderate centrilobular and paraseptal emphysematous changes. Superimposed lower lobe predominant fibrosis with bronchiectasis; Coronary atherosclerosis & atherosclerotic calcifications of the aortic arch; s/p RFA of a posterior right upper pole renal mass... ADDENDUM>> Discussed Chest CT & PET w/ [name]- IR, he feels that needle bx of this RUL lesion would be high risk for this frail 80 y/o lady & recommendscontinued active surveillance..           Problem List:   Hx of COUGH, THROAT CLEARING SYMPTOMS >>   UNDERLYING EMPHYSEMA & BASILAR INTERSTITIAL FIBROSIS/ BRONCHIECTASIS >>  2 PULMONARY  NODULES >> one in RLL measures ~1.5cm, is not FDG avid & stable serially; one in Right mid-zone is sub-solid, appears to be enlarging, and PET pos w/ max SUV=2.8...  >> hx intermittent and recurrent croupy cough &  throat symptoms- reflux related> we discussed maximum antireflux regimen w/ PPI Bid, NPO after dinner, elev HOB 6" etc... ~  baseline CXR showed incr markings c/w mild fibrosis... ~  CXR 2/11 showed cardiomeg, & diffuse chr interstitial opac, osteopenia, NAD.[name]. ~  CXR 7/12 from [name] showed chronic interstitial markings towards the lower lobes & atherosclerosis in Ao... ~  Referred back 8/12 by DrStonking w/ recurrent symptoms likely related to worsening subclinical reflux... ~  CT Chest 8/12 showed stable emphysematous changes and basilar fibrotic changes; stable scattered pulm nodularlity w/ lobulated subpleural nodule in the right lower lobe measures 1.4 x 1.0 cm (unchanged); aortic atheromatous changes; no signif adenopathy; RF ablation changes to right kidney... ~  PFT 8/12 showed FVC=2.23 (77%), FEV1=1.81 (88%), %1sec=81, mid-flows= 148% pred> c/w mild restriction. ~  Labs 8/12 were WNL and neg collagen-vasc screen (CBC/ BMet= wnl, ANA=neg, RF=<10, Sed=25, ACE=37, IgE<1.5)... ~  10/12: supposed to be on Pulmicort 2spBid, MMW, and max antireflux regimen- but it is unclear how much she is actually doing... ~  12/12: she saw [name] for f/u Renal cell ca> CXR w/ bibasilar fibrosis, CT Abd w/ section thru lung bases- about the same... ~  3/13: she is off the Pulmicort, Prilosec & antireflux measures; she has mult somatic complaints but not specifically c/o cough, sputum, SOB, etc; we will continue to monitor her progress... ~  CXR & Right Ribs 9/13>  Borderline heart size, calcif tort Ao, diffuse ILD, basilar atx, osteopenia, no fx.  ~  2/14:  CXR 2/14 in ER showed basilar scarring L>R, heart at upper lim of normal, NAD... ~  12/14: Dr Margaretmary Dys f/u CT Abd 12/14 showed no signs of  recurrence at the right renal ablation site, and stable 52mm nodule at right base, but noted an incr density RML area=> [name] ordered a dedicated CT Chest 12/14 which revealed mod emphysema, lower lobe interstitial fibrosis & bronchiectasis w/ mild honeycombing, stable 61mm right base nodule, and a new sub-solid nodule ~1.5cm in RML... ~  3/15:  CXR showed low lung volumes, cardiomeg & atherosclerotic changes in Ao, fibrosis & bronchiectasis at bases w/ 1.4cm nodule right lung base, no other lesion seen... ~  PET CT 3/15 showed 2.0 x 2.9 cm subsolid nodule in the posterior right upper lobe with associated 7 mm solid component, max SUV 2.8, (this has continued to increase from prior CT and is worrisome for carcinoma); 1.4 x 1.1 cm solid posterior right lower lobe nodule without appreciable hypermetabolism, max SUV 1.4; Underlying mild to moderate centrilobular and paraseptal emphysematous changes. Superimposed lower lobe predominant fibrosis with bronchiectasis; Coronary atherosclerosis & atherosclerotic calcifications of the aortic arch; s/p RFA of a posterior right upper pole renal mass...  Discussed Chest CT & PET w/ [name]- IR, he feels that needle bx of this RUL lesion would be high risk for this frail 80 y/o lady & recommendscontinued active surveillance.[name]    HYPERTENSION (ICD-401.9) - on Amlodipine per [name]... ~  10/12:  She did not bring her med list to the OV today... ~  3/13:  She has seen [name] & [name] since her last visit w/ me & EPIC lists Norvasc5 & Cardizem60 "prn" per [name] but she is unaware... ~  10/13:  BP= 116/68 & she denies CP, palpit, SOB, edema... ~  4/14:  BP= 136/74 & she continues to be asymptomatic...  ATRIAL FIBRILLATION (ICD-427.31) - long hx of tachy palpit> On [facility]ADIN [facility]; she is followed by [name] & [name] for  [facility] Cards. ~  She was tried on Flecainide & Sotalol but INTOL or ineffective;  Started on Amiodarone 11/11 but 2  unsuccessful cardioversions;  Finally had EP study & RF ablation & she has been holding NSR... ~  [name] tried to switch her from Coumadin to Xarelto but it was too $$$ & she switched back... ~  She saw [name] 12/13> maintaining NSR w/o dizziness, syncope, palpit, etc; tolerating Amio & he decreased her dose to 100mg /d  CEREBROVASCULAR DISEASE (ICD-437.9) - she had a neuro eval 2008 by DrWillis w/ hx of diplopia... ~  MRI showed some atrophy & small vessel disease...  HYPERCHOLESTEROLEMIA (ICD-272.0) - on PRAVASTATIN 80mg /d,  ZETIA 10mg /d,  FISH OIL 1000mg Bid... Labs followed by [name]... ~  EPIC EMR labs reviewed, we don't have [name]'s lab results...  GERD (ICD-530.81) - last EGD was 1998 by [facility] showing small gastric ulcer, neg HPylori, Rx PPI.  ~  We reviewed vigorous antireflux regimen for Rx of basilar pulm fibrosis> Prilosec20 before dinner, Zantac at bedtime, elev HOB, don't eat much after dinner.  CONSTIPATION (ICD-564.00) - prev colonoscopy 11/07 by DrDBrodie was WNL- no divertics, polyps, etc... constip Rx w/ Miralax/ Senakot OTC... ~  she had colonoscopy 7/13 by DrDBrodie showing mod sigm divertics, one sessile polyp= tubular adenoma... 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 she has urge > stress incont w/ unacceptable side effects from Detrol, Ditropan, Enablex, Veiscare... she had prev bladder tac from DrMcPhail, & Raz Urethropexy from [place]... she has a left renal cyst- 4-95mm size, no changes from 2000 & followed by sonar... incidental finding of an 33mm enhancing lesion in right kidney, mid to lower pole- seen  on CT Abd 9/08 (concern for small renal cell ca)... plan was for f/u sonar Q55mo to determine the growth rate... ~  S/p radiofreq ablation of right renal cell ca 4/10 by [name] ~  11/10: pt had percutaneous cryoablation of the right renal cell ca focal reurrence> he continues to follow the pt every 23mo... ~  f/u scan 12/12 showed stable ablation defect w/o evid of recurrent tumor or regional mets... ~  12/13:  She saw [name] for f/u Renal cell cancer, s/p radiofreq ablation 4/10 & retreatment 11/10 w/ percut cryoablation; CT Abd 12/13 showed stable ablation changes in right kidney w/o recurrent tumor, cyst in lower pole left kid, dil of intrahep biliary ducts, remote healed right rib fxs, stable atherosclerotic changes in Ao & branches; he plans f/u 1 yr  DEGENERATIVE JOINT DISEASE (ICD-715.90) - hx DJD and LBP treated by [name] and DrKritzer in the past...  MEMORY LOSS >> Followed by [name] for primary care/ Geriatrics...  ANXIETY DISORDER, GENERALIZED (ICD-300.02)   Past Medical History  Diagnosis Date  . Esophageal stricture   . Hypertension   . Atrial fibrillation   . Cerebrovascular disease, unspecified   . Hyperlipidemia   . DJD (degenerative joint disease)   . Unspecified disorder of kidney and ureter   . Renal cancer   . Osteoarthrosis, unspecified whether generalized or localized, unspecified site   . Generalized anxiety disorder   . GERD (gastroesophageal reflux disease)   . Hiatal  hernia   . PN (peripheral neuropathy)     in feet  . Rectal fissure   . Gastric ulcer   . IBS (irritable bowel syndrome)   . Insomnia   . Adenomatous colon polyp 2013  . Diverticulosis   . Skin cancer of nose   . Hx of cardiovascular stress test     Lexiscan Myoview (07/2013): No ischemia, EF 69%, normal study  . PONV (postoperative nausea and vomiting)     Past Surgical History  Procedure Laterality Date  . Appendectomy    . Cholecystectomy    . Bunionectomies        BILATERAL  . Anterior and posterior vaginal repair  1989    AP REPAIR & RAZ URETHROPEXY  . S/p percutaneous cryoablation of right renal cell cancer  07/2009    by [name]  . Partial hysterectomy  1977    Outpatient Encounter Prescriptions as of 11/29/2013  Medication Sig  . amiodarone (PACERONE) 200 MG tablet TAKE ONE TABLET BY MOUTH ONCE DAILY  . aspirin 81 MG chewable tablet Chew 81 mg by mouth daily.  . cetirizine (ZYRTEC) 10 MG tablet Take 10 mg by mouth daily.  . Cholecalciferol (VITAMIN D PO) Take 1 tablet by mouth daily.  . Coenzyme Q10 (CO Q-10) 100 MG CAPS Take 100 mg by mouth daily.  [name] diltiazem (CARDIZEM CD) 300 MG 24 hr capsule TAKE ONE CAPSULE BY MOUTH ONCE DAILY  . donepezil (ARICEPT) 10 MG tablet Take 10 mg by mouth at bedtime.  [name] ezetimibe (ZETIA) 10 MG tablet Take 10 mg by mouth daily.  [name] gabapentin (NEURONTIN) 100 MG capsule Take 100 mg by mouth 2 (two) times daily.  . Omega-3 Fatty Acids (FISH OIL PO) Take 1 tablet by mouth daily.  . pravastatin (PRAVACHOL) 40 MG tablet Take 40 mg by mouth at bedtime.  . pyridOXINE (VITAMIN B-6) 100 MG tablet Take 100 mg by mouth daily.  . sodium chloride (OCEAN) 0.65 % nasal spray Place 2 sprays into the nose as needed. For dry nose  . warfarin (COUMADIN) 5 MG tablet Take 5-7.5 mg by mouth daily. Takes 5 mg daily, except Wednesday and Saturday, then takes 7.5mg   . zolpidem (AMBIEN) 10 MG tablet Take 10 mg by mouth at bedtime as needed. For sleep  . amLODipine (NORVASC) 2.5 MG tablet Take 2.5 mg by mouth daily.  . mirabegron ER (MYRBETRIQ) 25 MG TB24 Take 25 mg by mouth at bedtime.  . [DISCONTINUED] ciprofloxacin (CIPRO) 250 MG tablet Take 1 tablet (250 mg total) by mouth 2 (two) times daily. For 2 days    Allergies  Allergen Reactions  . Other Nausea And Vomiting    Any kind of BELL PEPPER.   Extreme, violent nausea and vomiting quickly leading to dehydration  . Codeine Nausea And Vomiting       . Penicillins Nausea And  Vomiting  . Sulfa Antibiotics Itching    Current Medications, Allergies, Past Medical History, Past Surgical History, Family History, and Social History were reviewed in [employer] record.    Review of Systems       See HPI - all other systems neg except as noted...       The patient complains of decreased hearing and dyspnea on exertion.  The patient denies anorexia, fever, weight loss, weight gain, vision loss, hoarseness, chest pain, syncope, peripheral edema, headaches, hemoptysis, abdominal pain, melena, hematochezia, hematuria, incontinence, muscle weakness, suspicious skin lesions, transient blindness, difficulty walking, depression, unusual weight  change, abnormal bleeding, enlarged lymph nodes, and angioedema.     Objective:   Physical Exam     WD, WN, 80 y/o WF in NAD... GENERAL:  Alert & oriented; pleasant & cooperative... HEENT:  [place]/AT, EOM-wnl, PERRLA, EACs-clear, TMs-wnl, NOSE-clear, THROAT-clear & wnl, no lesions seen. NECK:  Supple w/ fairROM; no JVD; normal carotid impulses w/o bruits; no thyromegaly or nodules palpated; no lymphadenopathy. CHEST:  Clear to P & A x for dry velcro rales at bases bilat... HEART:  Regular Rhythm; without murmurs/ rubs/ or gallops heard... ABDOMEN:  Soft & nontender; normal bowel sounds; no organomegaly or masses detected. EXT: without deformities, mod arthritic changes; no varicose veins/ +venous insuffic/ no edema. NEURO:  CN's intact; motor testing normal; sensory testing normal; gait normal & balance OK. DERM:  No lesions noted; no rash etc...  CXR >> 4/12 film showed left basilar scarring & atx, otherw neg.               12/12 film w/ bibasilar scarring & chr changes.               9/13 CXR & Right Ribs>  Borderline heart size, calcif tort Ao, diffuse ILD, basilar atx, osteopenia, no fx.                CXR 2/14 in ER showed basilar scarring L>R, heart at upper lim of normal, NAD...  CT Chest 8/12 >> stable  emphysematous changes and basilar fibrotic changes; stable scattered pulm nodularlity w/ no suspicious lesions; aortic atheromatous changes; no signif adenopathy; RF ablation changes to right kidney...               CT Abd 12/12 showed similar chr fibrotic changes at lung bases...  Office Spirometry 8/12 >> FVC= 2.23 (77%), FEV1= 1.81 (88%), %1sec=81, mid-flows= 148%pred >> no signif obstruction, poss mild restriction...  Labs >> WNL and neg collagen-vasc screen (CBC/ BMet= wnl, ANA=neg, RF=<10, Sed=25, ACE=37, IgE<1.5)...   Assessment & Plan:    Chronic Cough, Throat symptoms, etc>  She is asked to maintain a vigorous antireflux regimen, but she is not motivated to take PPI, elev HOB, NPO after dinner etc...  Basilar Interstitial Lung Dis & 2 Pulmonary Nodules>  Similarly she was not motivated to use the Pulmicort, maintain exercise program, etc... Of the 2 identified pulm nodules> the right basilar opacity is unchanged & not FDG avid therefore likely benign; the right mid zone lesion is sub-solid but enlarging & PET pos (max SUV was 2.8); [name] feels that she is too high risk for bx & we will ontinue to follow this lesion serially...   HBP>  On meds per [name] & well controlled...  AFIB>  On Rx per [name] & [name] & currently improved after RF Ablation & holding NSR (on Amio 100 now); she refused Xarelto due to cost & is back on Coumadin.  CHOL>  On Prav40 n+ Zetia & labs followed by [name]...  GERD>  She has prev seen DrMedoff & may need f/u GI eval as above...  Renal Cell Ca>  S/p percut cryoablation by [name]...  MEMORY LOSS>  They would like medication to be started for this problem & given Aricept 5mg /d...  Other medical problems as noted.Marland KitchenMarland Kitchen

## 2013-12-06 ENCOUNTER — Ambulatory Visit (INDEPENDENT_AMBULATORY_CARE_PROVIDER_SITE_OTHER): Payer: Medicare Other

## 2013-12-06 DIAGNOSIS — Z7901 Long term (current) use of anticoagulants: Secondary | ICD-10-CM

## 2013-12-06 DIAGNOSIS — I4891 Unspecified atrial fibrillation: Secondary | ICD-10-CM

## 2013-12-06 LAB — POCT INR: INR: 3.3

## 2013-12-08 ENCOUNTER — Ambulatory Visit (HOSPITAL_COMMUNITY): Payer: Medicare Other

## 2013-12-09 ENCOUNTER — Ambulatory Visit (HOSPITAL_COMMUNITY)
Admission: RE | Admit: 2013-12-09 | Discharge: 2013-12-09 | Disposition: A | Discharge status: Home / Self Care | Payer: Medicare Other | Source: Ambulatory Visit | Attending: Pulmonary Disease | Admitting: Pulmonary Disease

## 2013-12-09 ENCOUNTER — Encounter (HOSPITAL_COMMUNITY): Payer: Self-pay

## 2013-12-09 DIAGNOSIS — I2510 Atherosclerotic heart disease of native coronary artery without angina pectoris: Secondary | ICD-10-CM | POA: Insufficient documentation

## 2013-12-09 DIAGNOSIS — Q6101 Congenital single renal cyst: Secondary | ICD-10-CM | POA: Insufficient documentation

## 2013-12-09 DIAGNOSIS — I700 Atherosclerosis of aorta: Secondary | ICD-10-CM | POA: Insufficient documentation

## 2013-12-09 DIAGNOSIS — Z85528 Personal history of other malignant neoplasm of kidney: Secondary | ICD-10-CM | POA: Insufficient documentation

## 2013-12-09 DIAGNOSIS — J8410 Pulmonary fibrosis, unspecified: Secondary | ICD-10-CM | POA: Insufficient documentation

## 2013-12-09 DIAGNOSIS — R911 Solitary pulmonary nodule: Secondary | ICD-10-CM | POA: Insufficient documentation

## 2013-12-09 LAB — GLUCOSE, CAPILLARY: Glucose-Capillary: 87 mg/dL (ref 70–99)

## 2013-12-09 MED ORDER — FLUDEOXYGLUCOSE F - 18 (FDG) INJECTION
7.3000 | Freq: Once | INTRAVENOUS | Status: AC | PRN
  Administered 2013-12-09: 7.3 via INTRAVENOUS

## 2013-12-13 ENCOUNTER — Telehealth: Payer: Self-pay | Admitting: Internal Medicine

## 2013-12-13 NOTE — Telephone Encounter (Signed)
Spoke with patient and she has been in and out of afib for the last week.  The Amiodarone was decreased to 100mg  daily.  She broke her ankle and tail bone 5 weeks ago and has been in a boot for 3 weeks.  This has been a big stressor for her.  Discussed with Dr Rayann Heman will increase Amiodarone to 200mg  daily and come in on Friday for an EKG.  Patient aware and I will call her tomorrow ti get time as she can not drive and her helper only comes from 2-5

## 2013-12-13 NOTE — Telephone Encounter (Signed)
New message     Pt states she is in AFIB and medication is not helping.  Pt has another doctor appt at 2pm

## 2013-12-16 ENCOUNTER — Telehealth: Payer: Self-pay | Admitting: Pulmonary Disease

## 2013-12-16 DIAGNOSIS — R911 Solitary pulmonary nodule: Secondary | ICD-10-CM

## 2013-12-16 NOTE — Telephone Encounter (Signed)
Called and spoke with pt and she is aware of PET scan report per SN.  She is aware that we will set up needle biopsy with IR at [facility].  Pt is requesting that this be scheduled for a Monday, Wednesday or Friday   Between 2-5.  i will put this in the order per pts request.  Order has  Been placed.

## 2013-12-17 ENCOUNTER — Telehealth: Payer: Self-pay | Admitting: Pulmonary Disease

## 2013-12-17 ENCOUNTER — Encounter: Payer: Medicare Other | Admitting: *Deleted

## 2013-12-17 NOTE — Telephone Encounter (Signed)
Called and spoke with pt. She reports she is suppose to have CT biopsy done. She wants this scheduled next Thursday or Friday so her daughter can come. Please advise PCC's thanks

## 2013-12-17 NOTE — Telephone Encounter (Signed)
This is being scheduled by IR radiology we did not set it up have them call ct [phone] thanks Joellen Jersey

## 2013-12-20 ENCOUNTER — Ambulatory Visit (INDEPENDENT_AMBULATORY_CARE_PROVIDER_SITE_OTHER): Payer: Medicare Other | Admitting: *Deleted

## 2013-12-20 DIAGNOSIS — Z7901 Long term (current) use of anticoagulants: Secondary | ICD-10-CM

## 2013-12-20 DIAGNOSIS — Z5181 Encounter for therapeutic drug level monitoring: Secondary | ICD-10-CM | POA: Insufficient documentation

## 2013-12-20 DIAGNOSIS — I4891 Unspecified atrial fibrillation: Secondary | ICD-10-CM

## 2013-12-20 LAB — POCT INR: INR: 1.7

## 2013-12-20 NOTE — Telephone Encounter (Signed)
#   provided to the pt. Laramie Bing, CMA

## 2013-12-21 ENCOUNTER — Other Ambulatory Visit: Payer: Self-pay | Admitting: *Deleted

## 2013-12-21 DIAGNOSIS — I4891 Unspecified atrial fibrillation: Secondary | ICD-10-CM

## 2013-12-21 MED ORDER — DILTIAZEM HCL ER COATED BEADS 180 MG PO CP24: 180.0000 mg | ORAL_CAPSULE | Freq: Every day | ORAL | Status: DC

## 2013-12-21 NOTE — Telephone Encounter (Signed)
I called patient and let her know Dr Rayann Heman wanted her to decrease her Amiodarone to 100mg  in 2 weeks and to decrease her Diltiazem to 180mg  now. She verbalized understanding and agrees with plan

## 2014-01-11 ENCOUNTER — Encounter (HOSPITAL_COMMUNITY): Payer: Self-pay | Admitting: Emergency Medicine

## 2014-01-11 ENCOUNTER — Emergency Department (HOSPITAL_COMMUNITY): Payer: Medicare Other

## 2014-01-11 ENCOUNTER — Telehealth: Payer: Self-pay | Admitting: Internal Medicine

## 2014-01-11 ENCOUNTER — Observation Stay (HOSPITAL_COMMUNITY)
Admission: EM | Admit: 2014-01-11 | Discharge: 2014-01-13 | Disposition: A | Discharge status: Home / Self Care | Payer: Medicare Other | Attending: Internal Medicine | Admitting: Internal Medicine

## 2014-01-11 DIAGNOSIS — G5790 Unspecified mononeuropathy of unspecified lower limb: Secondary | ICD-10-CM | POA: Insufficient documentation

## 2014-01-11 DIAGNOSIS — Z885 Allergy status to narcotic agent status: Secondary | ICD-10-CM | POA: Insufficient documentation

## 2014-01-11 DIAGNOSIS — K219 Gastro-esophageal reflux disease without esophagitis: Secondary | ICD-10-CM | POA: Diagnosis present

## 2014-01-11 DIAGNOSIS — Z881 Allergy status to other antibiotic agents status: Secondary | ICD-10-CM | POA: Insufficient documentation

## 2014-01-11 DIAGNOSIS — IMO0002 Reserved for concepts with insufficient information to code with codable children: Secondary | ICD-10-CM | POA: Insufficient documentation

## 2014-01-11 DIAGNOSIS — Z7982 Long term (current) use of aspirin: Secondary | ICD-10-CM | POA: Insufficient documentation

## 2014-01-11 DIAGNOSIS — Z880 Allergy status to penicillin: Secondary | ICD-10-CM | POA: Insufficient documentation

## 2014-01-11 DIAGNOSIS — Z882 Allergy status to sulfonamides status: Secondary | ICD-10-CM | POA: Insufficient documentation

## 2014-01-11 DIAGNOSIS — Y9210 Unspecified residential institution as the place of occurrence of the external cause: Secondary | ICD-10-CM | POA: Insufficient documentation

## 2014-01-11 DIAGNOSIS — Z7901 Long term (current) use of anticoagulants: Secondary | ICD-10-CM | POA: Insufficient documentation

## 2014-01-11 DIAGNOSIS — E785 Hyperlipidemia, unspecified: Secondary | ICD-10-CM | POA: Insufficient documentation

## 2014-01-11 DIAGNOSIS — R296 Repeated falls: Secondary | ICD-10-CM | POA: Insufficient documentation

## 2014-01-11 DIAGNOSIS — Z79899 Other long term (current) drug therapy: Secondary | ICD-10-CM | POA: Insufficient documentation

## 2014-01-11 DIAGNOSIS — Y9389 Activity, other specified: Secondary | ICD-10-CM | POA: Insufficient documentation

## 2014-01-11 DIAGNOSIS — Z85828 Personal history of other malignant neoplasm of skin: Secondary | ICD-10-CM | POA: Insufficient documentation

## 2014-01-11 DIAGNOSIS — F411 Generalized anxiety disorder: Secondary | ICD-10-CM | POA: Insufficient documentation

## 2014-01-11 DIAGNOSIS — I10 Essential (primary) hypertension: Secondary | ICD-10-CM | POA: Diagnosis present

## 2014-01-11 DIAGNOSIS — M1990 Unspecified osteoarthritis, unspecified site: Secondary | ICD-10-CM | POA: Insufficient documentation

## 2014-01-11 DIAGNOSIS — J84112 Idiopathic pulmonary fibrosis: Secondary | ICD-10-CM | POA: Insufficient documentation

## 2014-01-11 DIAGNOSIS — R55 Syncope and collapse: Principal | ICD-10-CM | POA: Diagnosis present

## 2014-01-11 DIAGNOSIS — Z8673 Personal history of transient ischemic attack (TIA), and cerebral infarction without residual deficits: Secondary | ICD-10-CM | POA: Insufficient documentation

## 2014-01-11 DIAGNOSIS — G4700 Insomnia, unspecified: Secondary | ICD-10-CM | POA: Insufficient documentation

## 2014-01-11 DIAGNOSIS — J8410 Pulmonary fibrosis, unspecified: Secondary | ICD-10-CM | POA: Diagnosis present

## 2014-01-11 DIAGNOSIS — J209 Acute bronchitis, unspecified: Secondary | ICD-10-CM | POA: Diagnosis present

## 2014-01-11 DIAGNOSIS — I4891 Unspecified atrial fibrillation: Secondary | ICD-10-CM | POA: Diagnosis present

## 2014-01-11 DIAGNOSIS — Z87891 Personal history of nicotine dependence: Secondary | ICD-10-CM | POA: Insufficient documentation

## 2014-01-11 DIAGNOSIS — Z85528 Personal history of other malignant neoplasm of kidney: Secondary | ICD-10-CM | POA: Insufficient documentation

## 2014-01-11 HISTORY — DX: Other chronic pain: G89.29

## 2014-01-11 HISTORY — DX: Low back pain: M54.5

## 2014-01-11 HISTORY — DX: Unspecified chronic bronchitis: J42

## 2014-01-11 HISTORY — DX: Other persistent atrial fibrillation: I48.19

## 2014-01-11 HISTORY — DX: Other fracture of right lower leg, initial encounter for closed fracture: S82.891A

## 2014-01-11 HISTORY — DX: Cardiac murmur, unspecified: R01.1

## 2014-01-11 HISTORY — DX: Low back pain, unspecified: M54.50

## 2014-01-11 LAB — CBC WITH DIFFERENTIAL/PLATELET
BASOS ABS: 0 10*3/uL (ref 0.0–0.1)
BASOS PCT: 1 % (ref 0–1)
Eosinophils Absolute: 0.2 10*3/uL (ref 0.0–0.7)
Eosinophils Relative: 3 % (ref 0–5)
HCT: 41.9 % (ref 36.0–46.0)
Hemoglobin: 13.9 g/dL (ref 12.0–15.0)
Lymphocytes Relative: 16 % (ref 12–46)
Lymphs Abs: 1.1 10*3/uL (ref 0.7–4.0)
MCH: 32 pg (ref 26.0–34.0)
MCHC: 33.2 g/dL (ref 30.0–36.0)
MCV: 96.3 fL (ref 78.0–100.0)
Monocytes Absolute: 0.6 10*3/uL (ref 0.1–1.0)
Monocytes Relative: 9 % (ref 3–12)
NEUTROS ABS: 4.8 10*3/uL (ref 1.7–7.7)
Neutrophils Relative %: 71 % (ref 43–77)
PLATELETS: 148 10*3/uL — AB (ref 150–400)
RBC: 4.35 MIL/uL (ref 3.87–5.11)
RDW: 13.3 % (ref 11.5–15.5)
WBC: 6.7 10*3/uL (ref 4.0–10.5)

## 2014-01-11 LAB — COMPREHENSIVE METABOLIC PANEL
ALK PHOS: 55 U/L (ref 39–117)
ALT: 19 U/L (ref 0–35)
AST: 33 U/L (ref 0–37)
Albumin: 3.7 g/dL (ref 3.5–5.2)
BUN: 19 mg/dL (ref 6–23)
CO2: 25 mEq/L (ref 19–32)
Calcium: 9 mg/dL (ref 8.4–10.5)
Chloride: 103 mEq/L (ref 96–112)
Creatinine, Ser: 0.96 mg/dL (ref 0.50–1.10)
GFR calc non Af Amer: 52 mL/min — ABNORMAL LOW (ref >90)
GFR, EST AFRICAN AMERICAN: 60 mL/min — AB (ref >90)
Glucose, Bld: 82 mg/dL (ref 70–99)
POTASSIUM: 4.5 meq/L (ref 3.7–5.3)
Sodium: 141 mEq/L (ref 137–147)
TOTAL PROTEIN: 6.9 g/dL (ref 6.0–8.3)
Total Bilirubin: 0.4 mg/dL (ref 0.3–1.2)

## 2014-01-11 LAB — PROTIME-INR
INR: 1.44 (ref 0.00–1.49)
PROTHROMBIN TIME: 17.2 s — AB (ref 11.6–15.2)

## 2014-01-11 LAB — TSH: TSH: 5.06 u[IU]/mL — ABNORMAL HIGH (ref 0.350–4.500)

## 2014-01-11 LAB — I-STAT CG4 LACTIC ACID, ED: Lactic Acid, Venous: 0.64 mmol/L (ref 0.5–2.2)

## 2014-01-11 LAB — URINALYSIS, ROUTINE W REFLEX MICROSCOPIC
Bilirubin Urine: NEGATIVE
Glucose, UA: NEGATIVE mg/dL
Hgb urine dipstick: NEGATIVE
KETONES UR: NEGATIVE mg/dL
Leukocytes, UA: NEGATIVE
NITRITE: NEGATIVE
PROTEIN: NEGATIVE mg/dL
SPECIFIC GRAVITY, URINE: 1.016 (ref 1.005–1.030)
Urobilinogen, UA: 0.2 mg/dL (ref 0.0–1.0)
pH: 6.5 (ref 5.0–8.0)

## 2014-01-11 LAB — MAGNESIUM: MAGNESIUM: 1.9 mg/dL (ref 1.5–2.5)

## 2014-01-11 LAB — TROPONIN I

## 2014-01-11 LAB — PHOSPHORUS: PHOSPHORUS: 2 mg/dL — AB (ref 2.3–4.6)

## 2014-01-11 MED ORDER — ZOLPIDEM TARTRATE 5 MG PO TABS
5.0000 mg | ORAL_TABLET | Freq: Every evening | ORAL | Status: DC | PRN
  Administered 2014-01-11 – 2014-01-12 (×2): 5 mg via ORAL
  Filled 2014-01-11 (×2): qty 1

## 2014-01-11 MED ORDER — VITAMIN E 180 MG (400 UNIT) PO CAPS: 400.0000 [IU] | ORAL_CAPSULE | Freq: Every day | ORAL | Status: DC

## 2014-01-11 MED ORDER — FAMOTIDINE 40 MG PO TABS
40.0000 mg | ORAL_TABLET | Freq: Every day | ORAL | Status: DC
  Administered 2014-01-11 – 2014-01-12 (×2): 40 mg via ORAL
  Filled 2014-01-11 (×3): qty 1

## 2014-01-11 MED ORDER — DILTIAZEM HCL ER COATED BEADS 180 MG PO CP24
180.0000 mg | ORAL_CAPSULE | Freq: Every day | ORAL | Status: DC
  Administered 2014-01-11: 180 mg via ORAL
  Filled 2014-01-11 (×2): qty 1

## 2014-01-11 MED ORDER — ONDANSETRON HCL 4 MG PO TABS: 4.0000 mg | ORAL_TABLET | Freq: Four times a day (QID) | ORAL | Status: DC | PRN

## 2014-01-11 MED ORDER — ASPIRIN 81 MG PO CHEW
81.0000 mg | CHEWABLE_TABLET | Freq: Every day | ORAL | Status: DC
  Administered 2014-01-11: 81 mg via ORAL
  Filled 2014-01-11: qty 1

## 2014-01-11 MED ORDER — SIMVASTATIN 40 MG PO TABS
40.0000 mg | ORAL_TABLET | Freq: Every day | ORAL | Status: DC
  Administered 2014-01-11: 40 mg via ORAL
  Filled 2014-01-11: qty 1

## 2014-01-11 MED ORDER — SODIUM CHLORIDE 0.9 % IJ SOLN
3.0000 mL | Freq: Two times a day (BID) | INTRAMUSCULAR | Status: DC
  Administered 2014-01-11 – 2014-01-13 (×2): 3 mL via INTRAVENOUS

## 2014-01-11 MED ORDER — FLECAINIDE ACETATE 100 MG PO TABS: 100.0000 mg | ORAL_TABLET | Freq: Every day | ORAL | Status: DC

## 2014-01-11 MED ORDER — SODIUM CHLORIDE 0.9 % IV SOLN: 1000.0000 mL | INTRAVENOUS | Status: DC

## 2014-01-11 MED ORDER — ATORVASTATIN CALCIUM 20 MG PO TABS
20.0000 mg | ORAL_TABLET | Freq: Every day | ORAL | Status: DC
  Administered 2014-01-12: 20 mg via ORAL
  Filled 2014-01-11 (×2): qty 1

## 2014-01-11 MED ORDER — DEXTROSE 5 % IV SOLN
1.0000 g | Freq: Once | INTRAVENOUS | Status: AC
  Administered 2014-01-11: 1 g via INTRAVENOUS
  Filled 2014-01-11 (×2): qty 10

## 2014-01-11 MED ORDER — GABAPENTIN 100 MG PO CAPS
100.0000 mg | ORAL_CAPSULE | Freq: Two times a day (BID) | ORAL | Status: DC
  Administered 2014-01-11 – 2014-01-13 (×4): 100 mg via ORAL
  Filled 2014-01-11 (×5): qty 1

## 2014-01-11 MED ORDER — SODIUM CHLORIDE 0.9 % IV SOLN
1000.0000 mL | Freq: Once | INTRAVENOUS | Status: AC
  Administered 2014-01-11: 1000 mL via INTRAVENOUS

## 2014-01-11 MED ORDER — BUDESONIDE 0.25 MG/2ML IN SUSP
0.2500 mg | Freq: Two times a day (BID) | RESPIRATORY_TRACT | Status: DC
  Administered 2014-01-11 – 2014-01-13 (×4): 0.25 mg via RESPIRATORY_TRACT
  Filled 2014-01-11 (×6): qty 2

## 2014-01-11 MED ORDER — ADULT MULTIVITAMIN W/MINERALS CH
1.0000 | ORAL_TABLET | Freq: Every day | ORAL | Status: DC
  Administered 2014-01-11 – 2014-01-13 (×3): 1 via ORAL
  Filled 2014-01-11 (×3): qty 1

## 2014-01-11 MED ORDER — PANTOPRAZOLE SODIUM 40 MG PO TBEC
40.0000 mg | DELAYED_RELEASE_TABLET | Freq: Every day | ORAL | Status: DC
  Administered 2014-01-12 – 2014-01-13 (×2): 40 mg via ORAL
  Filled 2014-01-11: qty 1

## 2014-01-11 MED ORDER — SODIUM CHLORIDE 0.9 % IV SOLN
1000.0000 mL | INTRAVENOUS | Status: DC
  Administered 2014-01-11: 1000 mL via INTRAVENOUS

## 2014-01-11 MED ORDER — EZETIMIBE 10 MG PO TABS
10.0000 mg | ORAL_TABLET | Freq: Every day | ORAL | Status: DC
  Administered 2014-01-11 – 2014-01-13 (×3): 10 mg via ORAL
  Filled 2014-01-11 (×3): qty 1

## 2014-01-11 MED ORDER — ACETAMINOPHEN 325 MG PO TABS
650.0000 mg | ORAL_TABLET | Freq: Four times a day (QID) | ORAL | Status: DC | PRN
  Administered 2014-01-11 – 2014-01-13 (×3): 650 mg via ORAL
  Filled 2014-01-11 (×3): qty 2

## 2014-01-11 MED ORDER — WARFARIN SODIUM 7.5 MG PO TABS
7.5000 mg | ORAL_TABLET | Freq: Once | ORAL | Status: AC
  Administered 2014-01-11: 7.5 mg via ORAL
  Filled 2014-01-11: qty 1

## 2014-01-11 MED ORDER — DOXYCYCLINE HYCLATE 100 MG PO TABS
100.0000 mg | ORAL_TABLET | Freq: Two times a day (BID) | ORAL | Status: DC
  Administered 2014-01-11: 100 mg via ORAL
  Filled 2014-01-11 (×3): qty 1

## 2014-01-11 MED ORDER — WARFARIN - PHARMACIST DOSING INPATIENT
Freq: Every day | Status: DC
  Administered 2014-01-12: 18:00:00

## 2014-01-11 MED ORDER — VITAMIN B-6 100 MG PO TABS
100.0000 mg | ORAL_TABLET | Freq: Every day | ORAL | Status: DC
  Administered 2014-01-11 – 2014-01-13 (×3): 100 mg via ORAL
  Filled 2014-01-11 (×3): qty 1

## 2014-01-11 MED ORDER — MIRABEGRON ER 25 MG PO TB24
25.0000 mg | ORAL_TABLET | Freq: Every day | ORAL | Status: DC
  Filled 2014-01-11: qty 1

## 2014-01-11 MED ORDER — SALINE NASAL SPRAY 0.65 % NA SOLN: 2.0000 | NASAL | Status: DC | PRN

## 2014-01-11 MED ORDER — CALCIUM CARBONATE-VITAMIN D 500-200 MG-UNIT PO TABS
1.0000 | ORAL_TABLET | Freq: Every day | ORAL | Status: DC
  Administered 2014-01-11 – 2014-01-13 (×3): 1 via ORAL
  Filled 2014-01-11 (×3): qty 1

## 2014-01-11 MED ORDER — IPRATROPIUM-ALBUTEROL 0.5-2.5 (3) MG/3ML IN SOLN: 3.0000 mL | RESPIRATORY_TRACT | Status: DC | PRN

## 2014-01-11 MED ORDER — SODIUM CHLORIDE 0.9 % IV SOLN: 250.0000 mL | INTRAVENOUS | Status: DC | PRN

## 2014-01-11 MED ORDER — DEXTROSE 5 % IV SOLN
500.0000 mg | Freq: Once | INTRAVENOUS | Status: AC
  Administered 2014-01-11: 500 mg via INTRAVENOUS

## 2014-01-11 MED ORDER — ONDANSETRON HCL 4 MG/2ML IJ SOLN: 4.0000 mg | Freq: Four times a day (QID) | INTRAMUSCULAR | Status: DC | PRN

## 2014-01-11 MED ORDER — FOLIC ACID 1 MG PO TABS
1.0000 mg | ORAL_TABLET | Freq: Every day | ORAL | Status: DC
  Administered 2014-01-11 – 2014-01-13 (×3): 1 mg via ORAL
  Filled 2014-01-11 (×3): qty 1

## 2014-01-11 MED ORDER — PINDOLOL 5 MG PO TABS: 2.5000 mg | ORAL_TABLET | Freq: Every day | ORAL | Status: DC

## 2014-01-11 MED ORDER — OMEGA-3-ACID ETHYL ESTERS 1 G PO CAPS
1.0000 g | ORAL_CAPSULE | Freq: Every day | ORAL | Status: DC
  Administered 2014-01-11 – 2014-01-13 (×3): 1 g via ORAL
  Filled 2014-01-11 (×3): qty 1

## 2014-01-11 MED ORDER — ACETAMINOPHEN 650 MG RE SUPP: 650.0000 mg | Freq: Four times a day (QID) | RECTAL | Status: DC | PRN

## 2014-01-11 MED ORDER — SODIUM CHLORIDE 0.9 % IJ SOLN
3.0000 mL | Freq: Two times a day (BID) | INTRAMUSCULAR | Status: DC
  Administered 2014-01-13: 3 mL via INTRAVENOUS

## 2014-01-11 MED ORDER — SODIUM CHLORIDE 0.9 % IJ SOLN: 3.0000 mL | INTRAMUSCULAR | Status: DC | PRN

## 2014-01-11 MED ORDER — LORATADINE 10 MG PO TABS
10.0000 mg | ORAL_TABLET | Freq: Every day | ORAL | Status: DC
  Administered 2014-01-11 – 2014-01-13 (×3): 10 mg via ORAL
  Filled 2014-01-11 (×3): qty 1

## 2014-01-11 NOTE — Telephone Encounter (Signed)
EP was consulted

## 2014-01-11 NOTE — Consult Note (Addendum)
ELECTROPHYSIOLOGY CONSULT NOTE    Patient ID: Anne Hayes MRN: [id number], DOB/AGE: [birth year] 80 y.o.  Admit date: 01/11/2014 Date of Consult: 01-11-14  Primary Physician: Mathews Argyle, MD Primary Cardiologist: Zenita Kister  Reason for Consultation: syncope  HPI:  Anne Hayes is a 80 y.o. female well known to me.  She has a past medical history significant for hypertension, hyperlipidemia, peripheral neuropathy, and persistent atrial fibrillation.  She underwent PVI 10/2010 and had recurrent atrial fibrillation in April 2013.  Amiodarone was resumed at that time. She has been maitained on Amiodarone 100mg  daily since 08/2012.  She was admitted last month with dehydration and persistent weakness.  She was also found to have an UTI at that time. She has had 3 syncopal spells over the last 2 weeks.  One time, she was sitting on the side of the bed, had a sudden loss of consciousness and awoke on the floor.  Two other times, she was walking to the restroom.  She denies recent fevers, chills, GI symptoms, chest pain, shortness of breath.  She feels that she has been mostly maintaining SR.   Last echo 06-22-10 demonstrated EF 60%, unable to assess for wall motion abnormalities, LA 35.  Lab work this admission is notable for subtherapeutic INR. Telemetry has demonstrated sinus rhythm, rates 50-60.   EP has been asked to evaluate for treatment options. ROS is negative except as outlined above.   Past Medical History  Diagnosis Date  . Esophageal stricture   . Hypertension   . Persistent atrial fibrillation   . Cerebrovascular disease, unspecified   . Hyperlipidemia   . DJD (degenerative joint disease)   . Unspecified disorder of kidney and ureter   . Renal cancer   . Osteoarthrosis, unspecified whether generalized or localized, unspecified site   . Generalized anxiety disorder   . GERD (gastroesophageal reflux disease)   . Hiatal hernia   . PN (peripheral neuropathy)     in feet   . Rectal fissure   . Gastric ulcer   . IBS (irritable bowel syndrome)   . Insomnia   . Adenomatous colon polyp 2013  . Diverticulosis   . Skin cancer of nose   . Hx of cardiovascular stress test     Lexiscan Myoview (07/2013): No ischemia, EF 69%, normal study  . PONV (postoperative nausea and vomiting)      Surgical History:  Past Surgical History  Procedure Laterality Date  . Appendectomy    . Cholecystectomy    . Bunionectomies       BILATERAL  . Anterior and posterior vaginal repair  1989    AP REPAIR & RAZ URETHROPEXY  . S/p percutaneous cryoablation of right renal cell cancer  07/2009    by [name]  . Partial hysterectomy  1977  . Ablation  11/07/10    PVI by Dr Rayann Heman     Prescriptions prior to admission  Medication Sig Dispense Refill  . amLODipine (NORVASC) 2.5 MG tablet Take 2.5 mg by mouth daily.      [name] aspirin 81 MG tablet Take 81 mg by mouth daily.      . calcium-vitamin D (OSCAL WITH D) 500-200 MG-UNIT per tablet Take 1 tablet by mouth daily.      . Coenzyme Q10 (CO Q-10) 100 MG CAPS Take 100 mg by mouth daily.      [name] ezetimibe (ZETIA) 10 MG tablet Take 10 mg by mouth daily.      . folic acid (FOLVITE) 1  MG tablet Take 1 mg by mouth daily.      [name] omega-3 acid ethyl esters (LOVAZA) 1 G capsule Take 1 g by mouth daily.      . pindolol (VISKEN) 5 MG tablet Take 2.5 mg by mouth daily.      . pravastatin (PRAVACHOL) 80 MG tablet Take 80 mg by mouth daily.      . ranitidine (ZANTAC) 300 MG capsule Take 300 mg by mouth every evening.      . vitamin E 400 UNIT capsule Take 400 Units by mouth daily.      [name] warfarin (COUMADIN) 5 MG tablet Take 5-7.5 mg by mouth daily. Takes 5 mg daily, except Wednesday and Saturday, then takes 7.5mg       . diltiazem (CARDIZEM CD) 180 MG 24 hr capsule Take 1 capsule (180 mg total) by mouth daily.  90 capsule  3  . donepezil (ARICEPT) 10 MG tablet Take 10 mg by mouth at bedtime.      . gabapentin (NEURONTIN) 100 MG capsule Take 100 mg  by mouth 2 (two) times daily.      [name] pyridOXINE (VITAMIN B-6) 100 MG tablet Take 100 mg by mouth daily.      . sodium chloride (OCEAN) 0.65 % nasal spray Place 2 sprays into the nose as needed. For dry nose      . zolpidem (AMBIEN) 10 MG tablet Take 10 mg by mouth at bedtime as needed. For sleep        Inpatient Medications:  . aspirin  81 mg Oral Daily  . budesonide (PULMICORT) nebulizer solution  0.25 mg Nebulization BID  . calcium-vitamin D  1 tablet Oral Daily  . diltiazem  180 mg Oral Daily  . doxycycline  100 mg Oral Q12H  . ezetimibe  10 mg Oral Daily  . famotidine  40 mg Oral QHS  . flecainide  100 mg Oral Daily  . folic acid  1 mg Oral Daily  . gabapentin  100 mg Oral BID  . loratadine  10 mg Oral Daily  . mirabegron ER  25 mg Oral QHS  . omega-3 acid ethyl esters  1 g Oral Daily  . [START ON 01/12/2014] pantoprazole  40 mg Oral Q1200  . pindolol  2.5 mg Oral Daily  . pyridOXINE  100 mg Oral Daily  . simvastatin  40 mg Oral q1800  . sodium chloride  3 mL Intravenous Q12H  . sodium chloride  3 mL Intravenous Q12H  . vitamin E  400 Units Oral Daily  . warfarin  7.5 mg Oral ONCE-1800  . Warfarin - Pharmacist Dosing Inpatient   Does not apply q1800    Allergies:  Allergies  Allergen Reactions  . Other Nausea And Vomiting    Any kind of BELL PEPPER.   Extreme, violent nausea and vomiting quickly leading to dehydration  . Codeine Nausea And Vomiting       . Penicillins Nausea And Vomiting  . Sulfa Antibiotics Itching    History   Social History  . Marital Status: Widowed    Spouse Name: N/A    Number of Children: 1  . Years of Education: N/A   Occupational History  . RETIRED    Social History Main Topics  . Smoking status: Former Smoker -- 1.00 packs/day for 50 years    Types: Cigarettes    Quit date: 09/30/1988  . Smokeless tobacco: Never Used  . Alcohol Use: 1.2 oz/week    2 Glasses of  wine per week     Comment: glass of wine with dinner occasionally    . Drug Use: No  . Sexual Activity: No   Other Topics Concern  . Not on file   Social History Narrative   WIDOWED   FORMER SMOKER   ETOH YES   NO DRUG USE   RETIRED           Family History  Problem Relation Age of Onset  . Heart disease Father   . Colon cancer Neg Hx   . Stroke Mother     Physical Exam: Filed Vitals:   01/11/14 1300 01/11/14 1714 01/11/14 2043 01/11/14 2058  BP: 155/58 146/58  178/56  Pulse: 54 81  58  Temp: 97.7 F (36.5 C) 99.1 F (37.3 C)  97.8 F (36.6 C)  TempSrc: Oral Oral  Oral  Resp: 16 14  18   Height: 5\' 6"  (1.676 m)     Weight: 133 lb 2.5 oz (60.4 kg)     SpO2: 98% 95% 95% 96%    GEN- The patient is well appearing, alert and oriented x 3 today.   Head- normocephalic, atraumatic Eyes-  Sclera clear, conjunctiva pink Ears- hearing intact Oropharynx- clear with dry mm Neck- supple, no JVP Lymph- no cervical lymphadenopathy Lungs- Clear to ausculation bilaterally, normal work of breathing Heart- Regular rate and rhythm, no murmurs, rubs or gallops, PMI not laterally displaced GI- soft, NT, ND, + BS Extremities- no clubbing, cyanosis, or edema MS- no significant deformity or atrophy Skin- no rash or lesion Psych- euthymic mood, full affect Neuro- strength and sensation are intact   Labs:   Lab Results  Component Value Date   WBC 6.7 01/11/2014   HGB 13.9 01/11/2014   HCT 41.9 01/11/2014   MCV 96.3 01/11/2014   PLT 148* 01/11/2014     Recent Labs Lab 01/11/14 0925  NA 141  K 4.5  CL 103  CO2 25  BUN 19  CREATININE 0.96  CALCIUM 9.0  PROT 6.9  BILITOT 0.4  ALKPHOS 55  ALT 19  AST 33  GLUCOSE 82     Radiology/Studies: Ct Head Wo Contrast 01/11/2014   CLINICAL DATA:  Pain post trauma  EXAM: CT HEAD WITHOUT CONTRAST  TECHNIQUE: Contiguous axial images were obtained from the base of the skull through the vertex without intravenous contrast.  COMPARISON:  October 28, 2013  FINDINGS: There is moderate diffuse atrophy. There  is no mass, hemorrhage, extra-axial fluid collection, or midline shift. There is patchy small vessel disease in the centra semiovale bilaterally, stable. There is no new gray-white compartment lesion. No acute infarct apparent.  Bony calvarium appears intact.  The mastoid air cells are clear.  IMPRESSION: Atrophy with periventricular small vessel disease. No intracranial mass, hemorrhage, or extra-axial fluid. There is no acute appearing infarct.   Electronically Signed   By: Lowella Grip M.D.   On: 01/11/2014 11:08   Dg Chest Port 1 View  01/11/2014   CLINICAL DATA:  Atrial fibrillation  EXAM: PORTABLE CHEST - 1 VIEW  COMPARISON:  NM PET IMAGE INITIAL (PI) SKULL BASE TO THIGH dated 12/09/2013; DG CHEST 2 VIEW dated 11/29/2013  FINDINGS: Normal cardiac silhouette with ectatic aorta. There is chronic bronchitic change and interstitial thickening similar to comparison PET-CT scan. No focal consolidation. No pneumothorax  IMPRESSION: Chronic bronchitic change and interstitial thickening. No acute findings   Electronically Signed   By: Suzy Bouchard M.D.   On: 01/11/2014 09:42   DXI:PJASN  rhythm, 1st degree AV block (PR 218)  TELEMETRY: sinus brady rates 50-60  Assessment and Plan:  1. Syncope Unclear etiology, though I am most suspicious of recurrent dehydration.  Her bradycardia is longstanding and has previously been asymptomatic.  I think that this is unlikely the cause for her syncope. At this point, I will repeat an echo.  In addition, she will continue to be monitored on telemetry while here. At discharge, I would recommend a 30 day event monitor to further monitor for arrhythmias No driving x 6 months Gentle IV hydration.  Check orthostatics. Encourage PO hydration at home  2. afib Well controlled Continue amiodarone 100mg  daily Stop pindolol if able given her bradycardia Stop ASA chads2vasc score is at least 4.  Continue coumadin long term.  3. HTN Stop pindolol Could restart  amlodipine if needed  If echo and overnight telemetry are unrevealing then she can be discharged in the am with outpatient 30 day monitor and follow-up in my office in 6 weeks

## 2014-01-11 NOTE — Telephone Encounter (Signed)
New message     Want Dr Rayann Heman to know that her mother has been admitted to [facility] because mother passed out.

## 2014-01-11 NOTE — ED Notes (Signed)
Pt undressed, in gown, on monitor, continuous pulse oximetry and blood pressure cuff; 2 warm blankets given to pt

## 2014-01-11 NOTE — ED Notes (Signed)
Pt taken off of bed pan.

## 2014-01-11 NOTE — Progress Notes (Addendum)
ANTICOAGULATION CONSULT NOTE - Initial Consult  Pharmacy Consult for coumadin Indication: atrial fibrillation  Allergies  Allergen Reactions  . Other Nausea And Vomiting    Any kind of BELL PEPPER.   Extreme, violent nausea and vomiting quickly leading to dehydration  . Codeine Nausea And Vomiting       . Penicillins Nausea And Vomiting  . Sulfa Antibiotics Itching    Patient Measurements: Height: 5\' 6"  (167.6 cm) Weight: 133 lb 2.5 oz (60.4 kg) IBW/kg (Calculated) : 59.3 Heparin Dosing Weight:   Vital Signs: Temp: 97.7 F (36.5 C) (04/14 1300) Temp src: Oral (04/14 1300) BP: 155/58 mmHg (04/14 1300) Pulse Rate: 54 (04/14 1300)  Labs:  Recent Labs  01/11/14 0925 01/11/14 0935  HGB TEST REQUEST RECEIVED WITHOUT APPROPRIATE SPECIMEN 13.9  HCT TEST REQUEST RECEIVED WITHOUT APPROPRIATE SPECIMEN 41.9  PLT TEST REQUEST RECEIVED WITHOUT APPROPRIATE SPECIMEN 148*  LABPROT 17.2*  --   INR 1.44  --   CREATININE 0.96  --     Estimated Creatinine Clearance: 38.6 ml/min (by C-G formula based on Cr of 0.96).   Medical History: Past Medical History  Diagnosis Date  . Esophageal stricture   . Hypertension   . Atrial fibrillation   . Cerebrovascular disease, unspecified   . Hyperlipidemia   . DJD (degenerative joint disease)   . Unspecified disorder of kidney and ureter   . Renal cancer   . Osteoarthrosis, unspecified whether generalized or localized, unspecified site   . Generalized anxiety disorder   . GERD (gastroesophageal reflux disease)   . Hiatal hernia   . PN (peripheral neuropathy)     in feet  . Rectal fissure   . Gastric ulcer   . IBS (irritable bowel syndrome)   . Insomnia   . Adenomatous colon polyp 2013  . Diverticulosis   . Skin cancer of nose   . Hx of cardiovascular stress test     Lexiscan Myoview (07/2013): No ischemia, EF 69%, normal study  . PONV (postoperative nausea and vomiting)     Medications:  Scheduled:  . aspirin  81 mg Oral  Daily  . budesonide (PULMICORT) nebulizer solution  0.25 mg Nebulization BID  . calcium-vitamin D  1 tablet Oral Daily  . diltiazem  180 mg Oral Daily  . doxycycline  100 mg Oral Q12H  . ezetimibe  10 mg Oral Daily  . famotidine  40 mg Oral QHS  . flecainide  100 mg Oral Daily  . folic acid  1 mg Oral Daily  . gabapentin  100 mg Oral BID  . loratadine  10 mg Oral Daily  . mirabegron ER  25 mg Oral QHS  . omega-3 acid ethyl esters  1 g Oral Daily  . [START ON 01/12/2014] pantoprazole  40 mg Oral Q1200  . pindolol  2.5 mg Oral Daily  . pyridOXINE  100 mg Oral Daily  . simvastatin  40 mg Oral q1800  . sodium chloride  3 mL Intravenous Q12H  . sodium chloride  3 mL Intravenous Q12H  . vitamin E  400 Units Oral Daily   Infusions:  . sodium chloride 1,000 mL (01/11/14 1500)    Assessment: 80 yo female with afib will be continued on coumadin.  INR is 1.44.  She was coumadin 5mg  daily.  Last dose was 01/10/14. Patient will be on doxycyline.  Goal of Therapy:  INR 2-3 Monitor platelets by anticoagulation protocol: Yes   Plan:  1) Coumadin 7.5 mg po x1 2) Daily  PT/INR  Tsz-Yin Regina Ganci 01/11/2014,4:24 PM

## 2014-01-11 NOTE — ED Notes (Addendum)
Pt from [facility] home via [facility] with c/o weakness x3 weeks causing a fall this am.  Pt reports "bumping" her head but no LOC.  Denies pain.  Pt also reports a decrease in her BP medications around 3 weeks ago at the start of the increasing weakness.  The "room spinning" preceded her fall.  Pt reports falling last night also.  Pt in NAD, A&O.

## 2014-01-11 NOTE — H&P (Addendum)
[facility] History and Physical  Anne Hayes [id number] DOB: 01-17-1924 DOA: 01/11/2014  Referring physician: Dr. Jeanell Sparrow PCP: Mathews Argyle, MD   Chief Complaint: syncope  HPI: Anne Hayes is a 80 y.o. female with pmh significant for GERD, HTN, atrial fibrillation, hx of CVA (no residual deficit and insomnia; came to ED after experiencing 2 episodes of syncope in the last 36 hours. Patient endorses some cough, PND, chills and subjective fever, that has been present for the last 3-4 days prior to admission. Patient reports no CP, no lightheadedness, no dizziness, no palpitations, no SOB, dysuria, abd pain, N/V or decrease PO intake. Troponin in ED negative, EKG w/o ischemic changes and CT head w/o acute intracranial abnormalities. CXR showing bronchitic changes but not infiltrates. [facility] called to admit for further evaluation and treatment.   Review of Systems:  Negative except as otherwise mentioned on HPI.  Past Medical History  Diagnosis Date  . Esophageal stricture   . Hypertension   . Atrial fibrillation   . Cerebrovascular disease, unspecified   . Hyperlipidemia   . DJD (degenerative joint disease)   . Unspecified disorder of kidney and ureter   . Renal cancer   . Osteoarthrosis, unspecified whether generalized or localized, unspecified site   . Generalized anxiety disorder   . GERD (gastroesophageal reflux disease)   . Hiatal hernia   . PN (peripheral neuropathy)     in feet  . Rectal fissure   . Gastric ulcer   . IBS (irritable bowel syndrome)   . Insomnia   . Adenomatous colon polyp 2013  . Diverticulosis   . Skin cancer of nose   . Hx of cardiovascular stress test     Lexiscan Myoview (07/2013): No ischemia, EF 69%, normal study  . PONV (postoperative nausea and vomiting)    Past Surgical History  Procedure Laterality Date  . Appendectomy    . Cholecystectomy    . Bunionectomies       BILATERAL  . Anterior and posterior vaginal repair   1989    AP REPAIR & RAZ URETHROPEXY  . S/p percutaneous cryoablation of right renal cell cancer  07/2009    by [name]  . Partial hysterectomy  1977   Social History:  reports that she quit smoking about 25 years ago. Her smoking use included Cigarettes. She has a 50 pack-year smoking history. She has never used smokeless tobacco. She reports that she drinks about 1.2 ounces of alcohol per week. She reports that she does not use illicit drugs.  Allergies  Allergen Reactions  . Other Nausea And Vomiting    Any kind of BELL PEPPER.   Extreme, violent nausea and vomiting quickly leading to dehydration  . Codeine Nausea And Vomiting       . Penicillins Nausea And Vomiting  . Sulfa Antibiotics Itching    Family History  Problem Relation Age of Onset  . Heart disease Father   . Colon cancer Neg Hx   . Stroke Mother      Prior to Admission medications   Medication Sig Start Date End Date Taking? Authorizing Provider  amLODipine (NORVASC) 2.5 MG tablet Take 2.5 mg by mouth daily.   Yes Historical Provider, MD  aspirin 81 MG chewable tablet Chew 81 mg by mouth daily.   Yes Historical Provider, MD  calcium-vitamin D (OSCAL WITH D) 500-200 MG-UNIT per tablet Take 1 tablet by mouth daily.   Yes Historical Provider, MD  Cholecalciferol (VITAMIN D PO) Take 1 tablet  by mouth daily.   Yes Historical Provider, MD  Coenzyme Q10 (CO Q-10) 100 MG CAPS Take 100 mg by mouth daily.   Yes Historical Provider, MD  ezetimibe (ZETIA) 10 MG tablet Take 10 mg by mouth daily.   Yes Historical Provider, MD  flecainide (TAMBOCOR) 100 MG tablet Take 100 mg by mouth.   Yes Historical Provider, MD  folic acid (FOLVITE) 1 MG tablet Take 1 mg by mouth daily.   Yes Historical Provider, MD  omega-3 acid ethyl esters (LOVAZA) 1 G capsule Take 1 g by mouth daily.   Yes Historical Provider, MD  pindolol (VISKEN) 5 MG tablet Take 2.5 mg by mouth daily.   Yes Historical Provider, MD  pravastatin (PRAVACHOL) 80 MG  tablet Take 80 mg by mouth daily.   Yes Historical Provider, MD  ranitidine (ZANTAC) 300 MG capsule Take 300 mg by mouth every evening.   Yes Historical Provider, MD  vitamin E 400 UNIT capsule Take 400 Units by mouth daily.   Yes Historical Provider, MD  warfarin (COUMADIN) 5 MG tablet Take 5-7.5 mg by mouth daily. Takes 5 mg daily, except Wednesday and Saturday, then takes 7.5mg    Yes Historical Provider, MD  amiodarone (PACERONE) 200 MG tablet TAKE ONE TABLET BY MOUTH ONCE DAILY 09/04/13   Thompson Grayer, MD  cetirizine (ZYRTEC) 10 MG tablet Take 10 mg by mouth daily.    Historical Provider, MD  diltiazem (CARDIZEM CD) 180 MG 24 hr capsule Take 1 capsule (180 mg total) by mouth daily. 12/21/13   Thompson Grayer, MD  donepezil (ARICEPT) 10 MG tablet Take 10 mg by mouth at bedtime.    Historical Provider, MD  gabapentin (NEURONTIN) 100 MG capsule Take 100 mg by mouth 2 (two) times daily.    Historical Provider, MD  mirabegron ER (MYRBETRIQ) 25 MG TB24 Take 25 mg by mouth at bedtime.    Historical Provider, MD  pyridOXINE (VITAMIN B-6) 100 MG tablet Take 100 mg by mouth daily.    Historical Provider, MD  sodium chloride (OCEAN) 0.65 % nasal spray Place 2 sprays into the nose as needed. For dry nose    Historical Provider, MD  zolpidem (AMBIEN) 10 MG tablet Take 10 mg by mouth at bedtime as needed. For sleep    Historical Provider, MD   Physical Exam: Filed Vitals:   01/11/14 1300  BP: 155/58  Pulse: 54  Temp: 97.7 F (36.5 C)  Resp: 16    BP 155/58  Pulse 54  Temp(Src) 97.7 F (36.5 C) (Oral)  Resp 16  Ht 5\' 6"  (1.676 m)  Wt 60.4 kg (133 lb 2.5 oz)  BMI 21.50 kg/m2  SpO2 98%  General:  Appears calm and comfortable, mild abrasion above left eye after fall; no fever and just mild coughing spells through examination Eyes: PERRL, normal lids, irises & conjunctiva, no nystagmus ENT: grossly normal hearing, lips & tongue, moist mucosae membranes, no erythema or exudates; nasal congestion  appreciated on exam; no drainage out of ears Neck: no LAD, masses or thyromegaly, no JVD Cardiovascular: RRR, positive SEM, no r/g. No LE edema. Telemetry: SR, no arrhythmias Respiratory: CTA bilaterally, no w/r/r. Normal respiratory effort. Abdomen: soft, nt, nd Skin: no rash or induration seen on limited exam Musculoskeletal: grossly normal tone BUE/BLE Psychiatric: grossly normal mood and affect, speech fluent and appropriate Neurologic: no new focal deficit. AAOX3, no focal motor or sensory deficit.          Labs on Admission:  Basic Metabolic  Panel:  Recent Labs Lab 01/11/14 0925  NA 141  K 4.5  CL 103  CO2 25  GLUCOSE 82  BUN 19  CREATININE 0.96  CALCIUM 9.0   Liver Function Tests:  Recent Labs Lab 01/11/14 0925  AST 33  ALT 19  ALKPHOS 55  BILITOT 0.4  PROT 6.9  ALBUMIN 3.7   CBC:  Recent Labs Lab 01/11/14 0925 01/11/14 0935  WBC TEST REQUEST RECEIVED WITHOUT APPROPRIATE SPECIMEN 6.7  NEUTROABS PENDING 4.8  HGB TEST REQUEST RECEIVED WITHOUT APPROPRIATE SPECIMEN 13.9  HCT TEST REQUEST RECEIVED WITHOUT APPROPRIATE SPECIMEN 41.9  MCV TEST REQUEST RECEIVED WITHOUT APPROPRIATE SPECIMEN 96.3  PLT TEST REQUEST RECEIVED WITHOUT APPROPRIATE SPECIMEN 148*    Radiological Exams on Admission: Ct Head Wo Contrast  01/11/2014   CLINICAL DATA:  Pain post trauma  EXAM: CT HEAD WITHOUT CONTRAST  TECHNIQUE: Contiguous axial images were obtained from the base of the skull through the vertex without intravenous contrast.  COMPARISON:  October 28, 2013  FINDINGS: There is moderate diffuse atrophy. There is no mass, hemorrhage, extra-axial fluid collection, or midline shift. There is patchy small vessel disease in the centra semiovale bilaterally, stable. There is no new gray-white compartment lesion. No acute infarct apparent.  Bony calvarium appears intact.  The mastoid air cells are clear.  IMPRESSION: Atrophy with periventricular small vessel disease. No intracranial  mass, hemorrhage, or extra-axial fluid. There is no acute appearing infarct.   Electronically Signed   By: Lowella Grip M.D.   On: 01/11/2014 11:08   Dg Chest Port 1 View  01/11/2014   CLINICAL DATA:  Atrial fibrillation  EXAM: PORTABLE CHEST - 1 VIEW  COMPARISON:  NM PET IMAGE INITIAL (PI) SKULL BASE TO THIGH dated 12/09/2013; DG CHEST 2 VIEW dated 11/29/2013  FINDINGS: Normal cardiac silhouette with ectatic aorta. There is chronic bronchitic change and interstitial thickening similar to comparison PET-CT scan. No focal consolidation. No pneumothorax  IMPRESSION: Chronic bronchitic change and interstitial thickening. No acute findings   Electronically Signed   By: Suzy Bouchard M.D.   On: 01/11/2014 09:42    EKG:  Ventricular Rate: 51  PR Interval: 218  QRS Duration: 94  QT Interval: 519  QTC Calculation: 478  R Axis: -18  Text Interpretation: bradycardia, was sinus when EKG taken; no ST segments elevation or depression concerning for ischemia. Borderline left axis deviation   Assessment/Plan 1-Syncopal episode: patient reports 2 episode of passing out, no prodromal symptoms. Had hx of atrial fibrillation s/p ablation and recent bradycardia with decrease amiodarone and diltiazem (approx 1 week PTA) to help with HR. Denies CP, palpitations, SOB. -most likely due to symptomatic bradycardia, while in ED HR down to 49; then in mid 50's -will admit to telemetry, check TSH, cycle troponin, check 2-D echo -will stop amiodarone, amlodipine and aricept -cardiology EP (consulted) -will also check vit D, B12 and ask PT to provide evaluation and treatment -CT head neg for acute intracranial abnormalities  2-acute bronchitis: bronchitic changes seen on CXR; no acute infiltrates; but patient reproting, productive cough and chills/subjective fever -will treat empirically with doxycycline for 7 days -continue saline spray and loratadine -will start pulmicort BID  3-HYPERTENSION: will continue  diltiazem and pindolol -BP well controlled -will start low sodium diet  4-Atrial fibrillation: as mentioned above will discontinue amiodarone. -continue diltiazem and follow EP recommendations for further medication adjustments.  5-GERD: continue PPI and QHS pepcid  6-Pulmonary interstitial fibrosis/?COPD: will continue PRN nebulizer. Start pulmicort and will treat  underlying bronchitis  7-DVT: on coumadin   Cardiology (Dr. Rayann Heman)  Code Status: DNR Family Communication: daughter at bedside  Disposition Plan: LOS < 2 midnights, telemetry bed; observation  Time spent: 43 minutes  [facility] Pager [phone]

## 2014-01-11 NOTE — ED Provider Notes (Signed)
CSN: [id number]     Arrival date & time 01/11/14  6144 History   First MD Initiated Contact with Patient 01/11/14 0840     Chief Complaint  Patient presents with  . Weakness  . Fall     (Consider location/radiation/quality/duration/timing/severity/associated sxs/prior Treatment) HPI 80 year old female who comes from an independent living facility presents today complaining of multiple falls. She states she fell last night and was able to get up again. She again fell this morning. She describes the falls is becoming lightheaded and then passing out. Or one of the times she struck her head after the fall. She states she has had some falls over several weeks but is unable to give me any further quantification. She has felt generally weak. She has had some cough but states that she has not been coughing anything up. She is known that she has had fever but states she has had some chills. She has been eating and drinking as usual. She complained of feeling very generally weak and not well today. She has some dyspnea. She has been placed on oxygen and this feels improved Past Medical History  Diagnosis Date  . Esophageal stricture   . Hypertension   . Atrial fibrillation   . Cerebrovascular disease, unspecified   . Hyperlipidemia   . DJD (degenerative joint disease)   . Unspecified disorder of kidney and ureter   . Renal cancer   . Osteoarthrosis, unspecified whether generalized or localized, unspecified site   . Generalized anxiety disorder   . GERD (gastroesophageal reflux disease)   . Hiatal hernia   . PN (peripheral neuropathy)     in feet  . Rectal fissure   . Gastric ulcer   . IBS (irritable bowel syndrome)   . Insomnia   . Adenomatous colon polyp 2013  . Diverticulosis   . Skin cancer of nose   . Hx of cardiovascular stress test     Lexiscan Myoview (07/2013): No ischemia, EF 69%, normal study  . PONV (postoperative nausea and vomiting)    Past Surgical History  Procedure  Laterality Date  . Appendectomy    . Cholecystectomy    . Bunionectomies       BILATERAL  . Anterior and posterior vaginal repair  1989    AP REPAIR & RAZ URETHROPEXY  . S/p percutaneous cryoablation of right renal cell cancer  07/2009    by [name]  . Partial hysterectomy  1977   Family History  Problem Relation Age of Onset  . Heart disease Father   . Colon cancer Neg Hx   . Stroke Mother    History  Substance Use Topics  . Smoking status: Former Smoker -- 1.00 packs/day for 50 years    Types: Cigarettes    Quit date: 09/30/1988  . Smokeless tobacco: Never Used  . Alcohol Use: 1.2 oz/week    2 Glasses of wine per week     Comment: glass of wine with dinner occasionally   OB History   Grav Para Term Preterm Abortions TAB SAB Ect Mult Living                 Review of Systems  Constitutional: Positive for activity change. Negative for fever.  HENT: Negative.   Eyes: Negative.   Respiratory: Positive for cough and shortness of breath.   Cardiovascular: Negative for chest pain and leg swelling.  Gastrointestinal: Negative.   Endocrine: Negative.   Genitourinary: Negative.   Allergic/Immunologic: Negative.   Neurological: Positive for  weakness. Negative for facial asymmetry.  Hematological: Negative.   Psychiatric/Behavioral: Negative.       Allergies  Other; Codeine; Penicillins; and Sulfa antibiotics  Home Medications   Prior to Admission medications   Medication Sig Start Date End Date Taking? Authorizing Provider  amLODipine (NORVASC) 2.5 MG tablet Take 2.5 mg by mouth daily.   Yes Historical Provider, MD  aspirin 81 MG chewable tablet Chew 81 mg by mouth daily.   Yes Historical Provider, MD  calcium-vitamin D (OSCAL WITH D) 500-200 MG-UNIT per tablet Take 1 tablet by mouth daily.   Yes Historical Provider, MD  Cholecalciferol (VITAMIN D PO) Take 1 tablet by mouth daily.   Yes Historical Provider, MD  Coenzyme Q10 (CO Q-10) 100 MG CAPS Take 100 mg by  mouth daily.   Yes Historical Provider, MD  ezetimibe (ZETIA) 10 MG tablet Take 10 mg by mouth daily.   Yes Historical Provider, MD  flecainide (TAMBOCOR) 100 MG tablet Take 100 mg by mouth.   Yes Historical Provider, MD  folic acid (FOLVITE) 1 MG tablet Take 1 mg by mouth daily.   Yes Historical Provider, MD  omega-3 acid ethyl esters (LOVAZA) 1 G capsule Take 1 g by mouth daily.   Yes Historical Provider, MD  pindolol (VISKEN) 5 MG tablet Take 2.5 mg by mouth daily.   Yes Historical Provider, MD  pravastatin (PRAVACHOL) 80 MG tablet Take 80 mg by mouth daily.   Yes Historical Provider, MD  ranitidine (ZANTAC) 300 MG capsule Take 300 mg by mouth every evening.   Yes Historical Provider, MD  vitamin E 400 UNIT capsule Take 400 Units by mouth daily.   Yes Historical Provider, MD  warfarin (COUMADIN) 5 MG tablet Take 5-7.5 mg by mouth daily. Takes 5 mg daily, except Wednesday and Saturday, then takes 7.5mg    Yes Historical Provider, MD  amiodarone (PACERONE) 200 MG tablet TAKE ONE TABLET BY MOUTH ONCE DAILY 09/04/13   Thompson Grayer, MD  cetirizine (ZYRTEC) 10 MG tablet Take 10 mg by mouth daily.    Historical Provider, MD  diltiazem (CARDIZEM CD) 180 MG 24 hr capsule Take 1 capsule (180 mg total) by mouth daily. 12/21/13   Thompson Grayer, MD  donepezil (ARICEPT) 10 MG tablet Take 10 mg by mouth at bedtime.    Historical Provider, MD  gabapentin (NEURONTIN) 100 MG capsule Take 100 mg by mouth 2 (two) times daily.    Historical Provider, MD  mirabegron ER (MYRBETRIQ) 25 MG TB24 Take 25 mg by mouth at bedtime.    Historical Provider, MD  pyridOXINE (VITAMIN B-6) 100 MG tablet Take 100 mg by mouth daily.    Historical Provider, MD  sodium chloride (OCEAN) 0.65 % nasal spray Place 2 sprays into the nose as needed. For dry nose    Historical Provider, MD  zolpidem (AMBIEN) 10 MG tablet Take 10 mg by mouth at bedtime as needed. For sleep    Historical Provider, MD   BP 165/56  Pulse 55  Temp(Src) 97.9 F  (36.6 C) (Rectal)  Resp 22  SpO2 97% Physical Exam  Nursing note and vitals reviewed. Constitutional: She is oriented to person, place, and time. She appears well-developed and well-nourished. No distress.  HENT:  Head: Normocephalic. Head is with abrasion.    Right Ear: External ear normal.  Left Ear: External ear normal.  Nose: Nose normal.  Mouth/Throat: Oropharynx is clear and moist.  Abrasion above left eye  Eyes: Conjunctivae and EOM are normal. Pupils are  equal, round, and reactive to light.  Neck: Normal range of motion. Neck supple.  Cardiovascular: Normal rate, regular rhythm, normal heart sounds and intact distal pulses.   Pulmonary/Chest: Effort normal. She has rales. She exhibits no tenderness.  Rales at left base  Abdominal: Soft. Bowel sounds are normal.  Musculoskeletal: She exhibits no edema and no tenderness.  Contusion yellow and purple in color rue and right chest wall.  Neurological: She is alert and oriented to person, place, and time. She displays normal reflexes. No cranial nerve deficit. She exhibits normal muscle tone. Coordination normal.  Patient slow to respond and has some decreased attentiveness and general weakness but otherwise normal neuro exam. Strength 5/5 bilateral upper and lower extremities   Skin: Skin is warm and dry.  Psychiatric: She has a normal mood and affect. Her behavior is normal. Thought content normal.    ED Course  Procedures (including critical care time) Labs Review Labs Reviewed  COMPREHENSIVE METABOLIC PANEL - Abnormal; Notable for the following:    GFR calc non Af Amer 52 (*)    GFR calc Af Amer 60 (*)    All other components within normal limits  PROTIME-INR - Abnormal; Notable for the following:    Prothrombin Time 17.2 (*)    All other components within normal limits  CBC WITH DIFFERENTIAL - Abnormal; Notable for the following:    Platelets 148 (*)    All other components within normal limits  CULTURE, BLOOD  (ROUTINE X 2)  CULTURE, BLOOD (ROUTINE X 2)  URINE CULTURE  CBC WITH DIFFERENTIAL  URINALYSIS, ROUTINE W REFLEX MICROSCOPIC  I-STAT CG4 LACTIC ACID, ED    Imaging Review Dg Chest Port 1 View  01/11/2014   CLINICAL DATA:  Atrial fibrillation  EXAM: PORTABLE CHEST - 1 VIEW  COMPARISON:  NM PET IMAGE INITIAL (PI) SKULL BASE TO THIGH dated 12/09/2013; DG CHEST 2 VIEW dated 11/29/2013  FINDINGS: Normal cardiac silhouette with ectatic aorta. There is chronic bronchitic change and interstitial thickening similar to comparison PET-CT scan. No focal consolidation. No pneumothorax  IMPRESSION: Chronic bronchitic change and interstitial thickening. No acute findings   Electronically Signed   By: Suzy Bouchard M.D.   On: 01/11/2014 09:42     EKG Interpretation   Date/Time:  Tuesday January 11 2014 08:30:40 EDT Ventricular Rate:  51 PR Interval:  218 QRS Duration: 94 QT Interval:  519 QTC Calculation: 478 R Axis:   -18 Text Interpretation:  Sinus rhythm Borderline prolonged PR interval  Borderline left axis deviation Low voltage, precordial leads Confirmed by  Aryia Delira MD, Sierrah Luevano ([id number]) on 01/11/2014 10:23:37 AM      MDM   Final diagnoses:  None   80 y.o. Female with multiple recent episode of syncope resulting in repeat falls.  Patient with relative bradycardia, cough with rales on exam here treated for cap, although negative cxr here.  Plan admission for further evaluation.   1- syncope 2- generalized weakness 3- frequent falls secondary to 1. 4- history of a fib, now nsr with rate of 51. 5- anticoagulated secondary to 4.   Discussed with Dr. Dyann Kief and plan observation in tele.   Shaune Pollack, MD 01/11/14 1145

## 2014-01-12 ENCOUNTER — Encounter (HOSPITAL_COMMUNITY): Payer: Self-pay | Admitting: Physical Therapy

## 2014-01-12 DIAGNOSIS — I359 Nonrheumatic aortic valve disorder, unspecified: Secondary | ICD-10-CM

## 2014-01-12 LAB — COMPREHENSIVE METABOLIC PANEL
ALT: 16 U/L (ref 0–35)
AST: 24 U/L (ref 0–37)
Albumin: 3.3 g/dL — ABNORMAL LOW (ref 3.5–5.2)
Alkaline Phosphatase: 55 U/L (ref 39–117)
BUN: 11 mg/dL (ref 6–23)
CALCIUM: 8.9 mg/dL (ref 8.4–10.5)
CO2: 24 meq/L (ref 19–32)
CREATININE: 0.78 mg/dL (ref 0.50–1.10)
Chloride: 106 mEq/L (ref 96–112)
GFR, EST AFRICAN AMERICAN: 85 mL/min — AB (ref >90)
GFR, EST NON AFRICAN AMERICAN: 73 mL/min — AB (ref >90)
GLUCOSE: 89 mg/dL (ref 70–99)
Potassium: 3.9 mEq/L (ref 3.7–5.3)
SODIUM: 142 meq/L (ref 137–147)
TOTAL PROTEIN: 6.3 g/dL (ref 6.0–8.3)
Total Bilirubin: 0.3 mg/dL (ref 0.3–1.2)

## 2014-01-12 LAB — PROTIME-INR
INR: 1.67 — AB (ref 0.00–1.49)
PROTHROMBIN TIME: 19.2 s — AB (ref 11.6–15.2)

## 2014-01-12 LAB — CBC
HEMATOCRIT: 37.9 % (ref 36.0–46.0)
HEMOGLOBIN: 12.6 g/dL (ref 12.0–15.0)
MCH: 31.8 pg (ref 26.0–34.0)
MCHC: 33.2 g/dL (ref 30.0–36.0)
MCV: 95.7 fL (ref 78.0–100.0)
Platelets: 160 10*3/uL (ref 150–400)
RBC: 3.96 MIL/uL (ref 3.87–5.11)
RDW: 13.1 % (ref 11.5–15.5)
WBC: 6.3 10*3/uL (ref 4.0–10.5)

## 2014-01-12 LAB — VITAMIN B12: Vitamin B-12: 570 pg/mL (ref 211–911)

## 2014-01-12 LAB — T4, FREE: FREE T4: 1.02 ng/dL (ref 0.80–1.80)

## 2014-01-12 LAB — T3, FREE: T3 FREE: 2.4 pg/mL (ref 2.3–4.2)

## 2014-01-12 MED ORDER — BOOST PLUS PO LIQD
237.0000 mL | Freq: Two times a day (BID) | ORAL | Status: DC
  Administered 2014-01-12: 237 mL via ORAL
  Filled 2014-01-12 (×5): qty 237

## 2014-01-12 MED ORDER — DEXTROSE 5 % IV SOLN
1.0000 g | INTRAVENOUS | Status: DC
  Administered 2014-01-12 – 2014-01-13 (×2): 1 g via INTRAVENOUS
  Filled 2014-01-12 (×2): qty 10

## 2014-01-12 MED ORDER — AMIODARONE HCL 100 MG PO TABS
100.0000 mg | ORAL_TABLET | Freq: Every day | ORAL | Status: DC
  Administered 2014-01-12 – 2014-01-13 (×2): 100 mg via ORAL
  Filled 2014-01-12 (×2): qty 1

## 2014-01-12 MED ORDER — DIPHENHYDRAMINE HCL 12.5 MG/5ML PO ELIX
12.5000 mg | ORAL_SOLUTION | Freq: Once | ORAL | Status: DC
  Filled 2014-01-12 (×2): qty 5

## 2014-01-12 MED ORDER — WARFARIN SODIUM 7.5 MG PO TABS
7.5000 mg | ORAL_TABLET | Freq: Once | ORAL | Status: AC
  Administered 2014-01-12: 7.5 mg via ORAL
  Filled 2014-01-12: qty 1

## 2014-01-12 MED ORDER — DILTIAZEM HCL ER COATED BEADS 120 MG PO CP24
120.0000 mg | ORAL_CAPSULE | Freq: Every day | ORAL | Status: DC
  Administered 2014-01-13: 120 mg via ORAL
  Filled 2014-01-12: qty 1

## 2014-01-12 MED ORDER — K PHOS MONO-SOD PHOS DI & MONO 155-852-130 MG PO TABS
250.0000 mg | ORAL_TABLET | Freq: Every day | ORAL | Status: AC
  Administered 2014-01-12 – 2014-01-13 (×2): 250 mg via ORAL
  Filled 2014-01-12 (×2): qty 1

## 2014-01-12 MED ORDER — DOCUSATE SODIUM 100 MG PO CAPS
100.0000 mg | ORAL_CAPSULE | Freq: Two times a day (BID) | ORAL | Status: DC
  Administered 2014-01-13: 100 mg via ORAL
  Filled 2014-01-12 (×3): qty 1

## 2014-01-12 NOTE — Progress Notes (Signed)
SUBJECTIVE: The patient is doing well today.  At this time, she denies chest pain, shortness of breath, or any new concerns.  She reports that she sat up too quickly this am and developed vertiginous dizziness.  No arrhythmias to correlate with this.  [name] amiodarone  100 mg Oral Daily  . atorvastatin  20 mg Oral q1800  . budesonide (PULMICORT) nebulizer solution  0.25 mg Nebulization BID  . calcium-vitamin D  1 tablet Oral Daily  . diltiazem  180 mg Oral Daily  . doxycycline  100 mg Oral Q12H  . ezetimibe  10 mg Oral Daily  . famotidine  40 mg Oral QHS  . folic acid  1 mg Oral Daily  . gabapentin  100 mg Oral BID  . loratadine  10 mg Oral Daily  . multivitamin with minerals  1 tablet Oral Daily  . omega-3 acid ethyl esters  1 g Oral Daily  . pantoprazole  40 mg Oral Q1200  . phosphorus  250 mg Oral Daily  . pyridOXINE  100 mg Oral Daily  . sodium chloride  3 mL Intravenous Q12H  . sodium chloride  3 mL Intravenous Q12H  . Warfarin - Pharmacist Dosing Inpatient   Does not apply q1800   . sodium chloride 1,000 mL (01/11/14 1500)    OBJECTIVE: Physical Exam: Filed Vitals:   01/11/14 2043 01/11/14 2058 01/12/14 0514 01/12/14 0515  BP:  178/56 154/58 156/66  Pulse:  58 51 66  Temp:  97.8 F (36.6 C) 97.9 F (36.6 C)   TempSrc:  Oral Oral   Resp:  18 18   Height:      Weight:   132 lb 15 oz (60.3 kg)   SpO2: 95% 96% 96%     Intake/Output Summary (Last 24 hours) at 01/12/14 0850 Last data filed at 01/12/14 0618  Gross per 24 hour  Intake    400 ml  Output      0 ml  Net    400 ml    Telemetry reveals afib, V rates are stable in the 50s  GEN- The patient is well appearing, alert and oriented x 3 today.   Head- normocephalic, atraumatic Eyes-  Sclera clear, conjunctiva pink Ears- hearing intact Oropharynx- clear Neck- supple,  Lungs- Clear to ausculation bilaterally, normal work of breathing Heart- irregular rate and rhythm, no murmurs, rubs or gallops, PMI not  laterally displaced GI- soft, NT, ND, + BS Extremities- no clubbing, cyanosis, or edema   LABS: Basic Metabolic Panel:  Recent Labs  01/11/14 0925 01/11/14 2000 01/12/14 0530  NA 141  --  142  K 4.5  --  3.9  CL 103  --  106  CO2 25  --  24  GLUCOSE 82  --  89  BUN 19  --  11  CREATININE 0.96  --  0.78  CALCIUM 9.0  --  8.9  MG  --  1.9  --   PHOS  --  2.0*  --    Liver Function Tests:  Recent Labs  01/11/14 0925 01/12/14 0530  AST 33 24  ALT 19 16  ALKPHOS 55 55  BILITOT 0.4 0.3  PROT 6.9 6.3  ALBUMIN 3.7 3.3*   No results found for this basename: LIPASE, AMYLASE,  in the last 72 hours CBC:  Recent Labs  01/11/14 0925 01/11/14 0935 01/12/14 0530  WBC TEST REQUEST RECEIVED WITHOUT APPROPRIATE SPECIMEN 6.7 6.3  NEUTROABS PENDING 4.8  --   HGB TEST  REQUEST RECEIVED WITHOUT APPROPRIATE SPECIMEN 13.9 12.6  HCT TEST REQUEST RECEIVED WITHOUT APPROPRIATE SPECIMEN 41.9 37.9  MCV TEST REQUEST RECEIVED WITHOUT APPROPRIATE SPECIMEN 96.3 95.7  PLT TEST REQUEST RECEIVED WITHOUT APPROPRIATE SPECIMEN 148* 160   Cardiac Enzymes:  Recent Labs  01/11/14 2000  TROPONINI <0.30   BNP: No components found with this basename: POCBNP,  D-Dimer: No results found for this basename: DDIMER,  in the last 72 hours Hemoglobin A1C: No results found for this basename: HGBA1C,  in the last 72 hours Fasting Lipid Panel: No results found for this basename: CHOL, HDL, LDLCALC, TRIG, CHOLHDL, LDLDIRECT,  in the last 72 hours Thyroid Function Tests:  Recent Labs  01/11/14 2000  TSH 5.060*   Anemia Panel:  Recent Labs  01/11/14 2000  VITAMINB12 570    RADIOLOGY: Ct Head Wo Contrast  01/11/2014   CLINICAL DATA:  Pain post trauma  EXAM: CT HEAD WITHOUT CONTRAST  TECHNIQUE: Contiguous axial images were obtained from the base of the skull through the vertex without intravenous contrast.  COMPARISON:  October 28, 2013  FINDINGS: There is moderate diffuse atrophy. There is no  mass, hemorrhage, extra-axial fluid collection, or midline shift. There is patchy small vessel disease in the centra semiovale bilaterally, stable. There is no new gray-white compartment lesion. No acute infarct apparent.  Bony calvarium appears intact.  The mastoid air cells are clear.  IMPRESSION: Atrophy with periventricular small vessel disease. No intracranial mass, hemorrhage, or extra-axial fluid. There is no acute appearing infarct.   Electronically Signed   By: Lowella Grip M.D.   On: 01/11/2014 11:08   Dg Chest Port 1 View  01/11/2014   CLINICAL DATA:  Atrial fibrillation  EXAM: PORTABLE CHEST - 1 VIEW  COMPARISON:  NM PET IMAGE INITIAL (PI) SKULL BASE TO THIGH dated 12/09/2013; DG CHEST 2 VIEW dated 11/29/2013  FINDINGS: Normal cardiac silhouette with ectatic aorta. There is chronic bronchitic change and interstitial thickening similar to comparison PET-CT scan. No focal consolidation. No pneumothorax  IMPRESSION: Chronic bronchitic change and interstitial thickening. No acute findings   Electronically Signed   By: Suzy Bouchard M.D.   On: 01/11/2014 09:42    ASSESSMENT AND PLAN:  Principal Problem:   Syncopal episodes Active Problems:   HYPERTENSION   Atrial fibrillation   GERD   Pulmonary interstitial fibrosis   Acute bronchitis   Syncope and collapse  1. Syncope  Unclear etiology, though I am most suspicious of recurrent dehydration. Her bradycardia is longstanding and has previously been asymptomatic. I think that this is unlikely the cause for her syncope.  Given her positional dizziness this am, vestibular etiology should also be considered.  Further workup per primary team. Encourage PO hydration at home   2. afib  Well controlled  Continue amiodarone 100mg  daily  Stop pindolol if able given her bradycardia  Stop ASA  chads2vasc score is at least 4. Continue coumadin long term.   3. HTN  Stop pindolol  Could restart amlodipine if needed   Echo is pending.  If  not significantly changed, no further inpatient cardiology workup is planned 30 day monitor to be arranged by my office.  Electrophysiology team to see as needed while here. Please call with questions.     Thompson Grayer, MD 01/12/2014 8:50 AM

## 2014-01-12 NOTE — Progress Notes (Signed)
INITIAL NUTRITION ASSESSMENT  DOCUMENTATION CODES Per approved criteria  -Not Applicable   INTERVENTION: - Boost Plus BID - Recommend MD order bowel regimen to help with pt's constipation - RD to continue to monitor   NUTRITION DIAGNOSIS: Altered GI function related to constipation with stomach pain as evidenced by pt report.   Goal: 1. Resolution of constipation 2. Pt to consume >90% of meals/supplements  Monitor:  Weights, labs, intake, BMs  Reason for Assessment: Malnutrition screening tool   80 y.o. female  Admitting Dx: Syncopal episodes  ASSESSMENT: Pt with hx of GERD, HTN, atrial fibrillation, hx of CVA (no residual deficit) and insomnia; came to ED after experiencing 2 episodes of syncope in the last 36 hours.  Met with pt and daughter who report pt eating well PTA, 3 meals/day - breakfast consists of egg mcmuffin and some oatmeal, lunch is buffet at the facility, and a sandwich with Boost for dinner. States she may have lost 5 pounds in the past 5 months from being in the hospital. C/o constipation with no BM in 3 days causing some stomach pain. Did not appear cachetic in upper body, deferred nutrition focused physical exam as pt was eating lunch.   Phosphorus low, getting oral replacement and multivitamin  Height: Ht Readings from Last 1 Encounters:  01/11/14 $RemoveB'5\' 6"'QcTddaBo$  (1.676 m)    Weight: Wt Readings from Last 1 Encounters:  01/12/14 132 lb 15 oz (60.3 kg)    Ideal Body Weight: 130 lbs  % Ideal Body Weight: 101%  Wt Readings from Last 10 Encounters:  01/12/14 132 lb 15 oz (60.3 kg)  11/29/13 133 lb 6.4 oz (60.51 kg)  10/28/13 134 lb (60.782 kg)  10/23/13 134 lb (60.782 kg)  09/14/13 136 lb (61.689 kg)  08/19/13 135 lb (61.236 kg)  08/03/13 136 lb (61.689 kg)  07/17/13 133 lb 1.6 oz (60.374 kg)  07/05/13 136 lb (61.689 kg)  04/12/13 140 lb 12.8 oz (63.866 kg)    Usual Body Weight: 137 lbs   % Usual Body Weight: 96%  BMI:  Body mass index is 21.47  kg/(m^2).  Estimated Nutritional Needs: Kcal: 1500-1700 Protein: 70-80g Fluid: 1.5-1.7L/day  Skin: Intact  Diet Order: Cardiac  EDUCATION NEEDS: -No education needs identified at this time   Intake/Output Summary (Last 24 hours) at 01/12/14 1334 Last data filed at 01/12/14 1045  Gross per 24 hour  Intake    400 ml  Output    200 ml  Net    200 ml    Last BM: PTA  Labs:   Recent Labs Lab 01/11/14 0925 01/11/14 2000 01/12/14 0530  NA 141  --  142  K 4.5  --  3.9  CL 103  --  106  CO2 25  --  24  BUN 19  --  11  CREATININE 0.96  --  0.78  CALCIUM 9.0  --  8.9  MG  --  1.9  --   PHOS  --  2.0*  --   GLUCOSE 82  --  89    CBG (last 3)  No results found for this basename: GLUCAP,  in the last 72 hours  Scheduled Meds: . amiodarone  100 mg Oral Daily  . atorvastatin  20 mg Oral q1800  . budesonide (PULMICORT) nebulizer solution  0.25 mg Nebulization BID  . calcium-vitamin D  1 tablet Oral Daily  . cefTRIAXone (ROCEPHIN)  IV  1 g Intravenous Q24H  . [START ON 01/13/2014] diltiazem  120  mg Oral Daily  . ezetimibe  10 mg Oral Daily  . famotidine  40 mg Oral QHS  . folic acid  1 mg Oral Daily  . gabapentin  100 mg Oral BID  . loratadine  10 mg Oral Daily  . multivitamin with minerals  1 tablet Oral Daily  . omega-3 acid ethyl esters  1 g Oral Daily  . pantoprazole  40 mg Oral Q1200  . phosphorus  250 mg Oral Daily  . pyridOXINE  100 mg Oral Daily  . sodium chloride  3 mL Intravenous Q12H  . sodium chloride  3 mL Intravenous Q12H  . warfarin  7.5 mg Oral ONCE-1800  . Warfarin - Pharmacist Dosing Inpatient   Does not apply q1800    Continuous Infusions: . sodium chloride 1,000 mL (01/11/14 1500)    Past Medical History  Diagnosis Date  . Esophageal stricture   . Hypertension   . Persistent atrial fibrillation   . Cerebrovascular disease, unspecified   . Hyperlipidemia   . Unspecified disorder of kidney and ureter   . Renal cancer   . Generalized  anxiety disorder   . Hiatal hernia   . PN (peripheral neuropathy)     in feet  . Rectal fissure   . Gastric ulcer   . IBS (irritable bowel syndrome)   . Insomnia   . Adenomatous colon polyp 2013  . Diverticulosis   . Skin cancer of nose   . Hx of cardiovascular stress test     Lexiscan Myoview (07/2013): No ischemia, EF 69%, normal study  . PONV (postoperative nausea and vomiting)   . Heart murmur   . Chronic bronchitis     "most q yr"   . GERD (gastroesophageal reflux disease)     "not bothered w/it lately" (01/11/2014)  . DJD (degenerative joint disease)   . Osteoarthrosis, unspecified whether generalized or localized, unspecified site   . Chronic lower back pain     Past Surgical History  Procedure Laterality Date  . Appendectomy    . Cholecystectomy    . Bunionectomies  Bilateral   . Anterior and posterior vaginal repair  1989    AP REPAIR & RAZ URETHROPEXY  . Renal cryoablation Right 07/2009    s/p percutaneous cryoablation of right renal cell cancer; by [name]  . Ablation  11/07/10    [facility] by Dr Rayann Heman  . Tonsillectomy    . Abdominal hysterectomy  1977    "partial"  . Cataract extraction w/ intraocular lens  implant, bilateral Bilateral   . Skin cancer excision      "nose; took skin from face and grafted area"    Mikey College MS, [place], [place] Pager [phone] After Hours Pager

## 2014-01-12 NOTE — Discharge Instructions (Signed)
NO DRIVING for 6 months until given clearance by Dr. Rayann Heman.

## 2014-01-12 NOTE — Progress Notes (Signed)
UR completed 

## 2014-01-12 NOTE — Progress Notes (Signed)
  Echocardiogram 2D Echocardiogram has been performed.  Anne Hayes 01/12/2014, 10:34 AM

## 2014-01-12 NOTE — Evaluation (Signed)
Physical Therapy Evaluation Patient Details Name: Anne Hayes MRN: [id number] DOB: 10-17-1923 Today's Date: 01/12/2014   History of Present Illness  80 y.o. female admitted to [facility] on 01/11/14 after two syncopal episodes.  Pt has had several falls recently including one with head trauma to her left orbit.  Differential dx includes symptomatic bradycardia, orthostatic hypotension, and/or bronchitis.  CT head was negative for acute event.  Pt with significant PMHx of HTN, A-fib, CVA (with no residual deficits), peripherial neuropathy, chronic low back pain, and R ankle fx (closed, non-displaced treated with a CAM boot).  Pt also reports long standing h/o double vision (she closes her left eye to compensate and wears glasses that also help).    Clinical Impression  Pt found to have R posterior canal BPPV.  Treated with Epley's Maneuver x 1 with improvement in symptoms.  Orthostatics taken (see below).  Pt will need continued vestibular assessment and treatment and would benefit from balance therapy as she has a long standing h/o peripheral neuropathy and double vision.   PT to follow acutely for deficits listed below.       Follow Up Recommendations Outpatient PT (for vestibular therapy.  )    Equipment Recommendations  None recommended by PT    Recommendations for Other Services   none    Precautions / Restrictions Precautions Precautions: Fall Precaution Comments: h/o frequent falls      Mobility  Bed Mobility Overal bed mobility: Needs Assistance Bed Mobility: Supine to Sit     Supine to sit: Min guard     General bed mobility comments: min guard assist for safety as the first time we sat up pt looked a bit unstead with the transition.  Improved with multiple attempts.   Transfers Overall transfer level: Needs assistance Equipment used: None;Rolling walker (2 wheeled) Transfers: Sit to/from Stand Sit to Stand: Min assist         General transfer comment: Min assist to  steady pt for balance and to help anteriorly translate her trunk over her feet.  Pt initially with significant posterior lean in standing with bil lower legs reliant on the bed for support/balance.  This also improved with repetition.   Ambulation/Gait Ambulation/Gait assistance: Min assist Ambulation Distance (Feet): 100 Feet Assistive device: Rolling walker (2 wheeled) Gait Pattern/deviations: Step-through pattern     General Gait Details: Pt did well with gait with RW. Min assist to support trunk for balance and safety, but pt could be up to min guard or supervision by the end of gait.  Verbal cues to turn slowly to decrease any spinning sensation and chair kept close in case she got lightheaded or dizzy all of a sudden.          Balance Overall balance assessment: Needs assistance Sitting-balance support: Feet supported Sitting balance-Leahy Scale: Fair Sitting balance - Comments: `   Standing balance support: Bilateral upper extremity supported Standing balance-Leahy Scale: Poor Standing balance comment: Initially pt with significant posterior lean in standing and unable to move her legs away from the bed to avoid posterior LOB.  After walking, though, much better, could be supervision for static standing balance away from the bed and could even be close supervision with eye closed balance away from the bed.                               Pertinent Vitals/Pain  01/12/14 1531  Vital Signs  Pulse Rate !  56  BP 139/65 mmHg  BP Location Left arm  BP Method Automatic  Patient Position, if appropriate Lying  Oxygen Therapy  SpO2 96 %  O2 Device None (Room air)    01/12/14 1537  Vital Signs  Pulse Rate 61  BP 135/65 mmHg  BP Location Left arm  BP Method Automatic  Patient Position, if appropriate Sitting  Oxygen Therapy  SpO2 96 %  O2 Device None (Room air)    01/12/14 1548  Vital Signs  Pulse Rate 64  BP 119/65 mmHg  BP Location Left arm  BP Method  Automatic  Patient Position, if appropriate Standing       Home Living Family/patient expects to be discharged to:: Private residence Living Arrangements: Alone Available Help at Discharge: Personal care attendant Type of Home: Apartment ([facility] independent [facility]) Home Access: Level entry     Home Layout: One level Home Equipment: Environmental consultant - 4 wheels;Walker - 2 wheels;Cane - single point      Prior Function Level of Independence: Independent         Comments: uses cane when outside of her apartment, furniture walks while inside of her apartment.         Extremity/Trunk Assessment   Upper Extremity Assessment: Overall WFL for tasks assessed           Lower Extremity Assessment: Generalized weakness      Cervical / Trunk Assessment: Normal (h/o chronic low back pain.  )  Communication   Communication: No difficulties  Cognition Arousal/Alertness: Awake/alert Behavior During Therapy: WFL for tasks assessed/performed Overall Cognitive Status: Within Functional Limits for tasks assessed                      General Comments General comments (skin integrity, edema, etc.): Vestibular assessment completed and pt found to have (+) R sided [employer] with very typical rotational nystagmus and spinning sensation.  Epley's preformed x 1 with improvement in symptoms.  She continued to test (+) to the right, but much shorter duration and per pt much less intesity the second time.            Assessment/Plan    PT Assessment Patient needs continued PT services  PT Diagnosis Difficulty walking;Abnormality of gait;Generalized weakness   PT Problem List Decreased strength;Decreased activity tolerance;Decreased balance;Decreased mobility;Decreased coordination;Decreased knowledge of use of DME  PT Treatment Interventions DME instruction;Gait training;Functional mobility training;Therapeutic activities;Therapeutic exercise;Balance training;Neuromuscular  re-education;Patient/family education   PT Goals (Current goals can be found in the Care Plan section) Acute Rehab PT Goals Patient Stated Goal: to stop falling PT Goal Formulation: With patient/family Time For Goal Achievement: 01/26/14 Potential to Achieve Goals: Good    Frequency Min 4X/week    End of Session Equipment Utilized During Treatment: Gait belt Activity Tolerance: Patient limited by fatigue Patient left: in chair;with call bell/phone within reach;with family/visitor present Nurse Communication: Mobility status    Functional Assessment Tool Used: assist level Functional Limitation: Mobility: Walking and moving around Mobility: Walking and Moving Around Current Status [phone]): At least 20 percent but less than 40 percent impaired, limited or restricted Mobility: Walking and Moving Around Goal Status [phone]): At least 1 percent but less than 20 percent impaired, limited or restricted    Time: 1525-1640 PT Time Calculation (min): 75 min   Charges:   PT Evaluation $Initial PT Evaluation Tier I: 1 Procedure PT Treatments $Gait Training: 8-22 mins $Therapeutic Activity: 23-37 mins $Canalith Rep Proc: 8-22 mins   PT  G Codes:   Functional Assessment Tool Used: assist level Functional Limitation: Mobility: Walking and moving around    Rhame B. Antigo, [place], DPT [phone]   01/12/2014, 5:38 PM

## 2014-01-12 NOTE — Progress Notes (Signed)
ANTICOAGULATION CONSULT NOTE - Follow Up Consult  Pharmacy Consult for Coumadin Indication: atrial fibrillation  Allergies  Allergen Reactions  . Other Nausea And Vomiting    Any kind of BELL PEPPER.   Extreme, violent nausea and vomiting quickly leading to dehydration  . Codeine Nausea And Vomiting       . Penicillins Nausea And Vomiting  . Sulfa Antibiotics Itching    Patient Measurements: Height: 5\' 6"  (167.6 cm) Weight: 132 lb 15 oz (60.3 kg) IBW/kg (Calculated) : 59.3  Vital Signs: Temp: 97.9 F (36.6 C) (04/15 0514) Temp src: Oral (04/15 0514) BP: 156/66 mmHg (04/15 0515) Pulse Rate: 66 (04/15 0515)  Labs:  Recent Labs  01/11/14 0925 01/11/14 0935 01/11/14 2000 01/12/14 0530  HGB TEST REQUEST RECEIVED WITHOUT APPROPRIATE SPECIMEN 13.9  --  12.6  HCT TEST REQUEST RECEIVED WITHOUT APPROPRIATE SPECIMEN 41.9  --  37.9  PLT TEST REQUEST RECEIVED WITHOUT APPROPRIATE SPECIMEN 148*  --  160  LABPROT 17.2*  --   --  19.2*  INR 1.44  --   --  1.67*  CREATININE 0.96  --   --  0.78  TROPONINI  --   --  <0.30  --     Estimated Creatinine Clearance: 46.4 ml/min (by C-G formula based on Cr of 0.78).  Assessment:   INR 1.44->1.67 after Coumadin 7.5 mg last night.   Home Coumadin regimen: 5 mg daily.   Got Doxycycline PO and Azithromycin IV x 1 last night, but changed to Ceftriaxone.  Strep species in urine.  Sensitivities pending.  Goal of Therapy:  INR 2-3 Monitor platelets by anticoagulation protocol: Yes   Plan:   Repeat Coumadin 7.5 mg tonight.  Continue daily PT/INR.  Arty Baumgartner, [place] Pager: [phone] 01/12/2014,12:47 PM

## 2014-01-12 NOTE — Progress Notes (Signed)
[facility] PROGRESS NOTE  Anne Hayes EXH:[id number] DOB: 1924/09/09 DOA: 01/11/2014 PCP: Mathews Argyle, MD  Assessment/Plan: 1-Syncopal episode: patient reports 2 episode of passing out, no prodromal symptoms. Had hx of atrial fibrillation s/p ablation and recent bradycardia with decrease amiodarone and diltiazem (approx 1 week PTA) to help with HR. -Check orthostatic vitals. Will also rule out stroke with MRI multiple risk factors.  -cardiology EP (consulted). Dr Rayann Heman doesn't think Bradycardia is cause of symptoms.  -vit D pending. , B-12 at 570.  -CT head neg for acute intracranial abnormalities  -PT evaluation.  2-acute bronchitis: bronchitic changes seen on CXR; no acute infiltrates; but patient reproting, productive cough and chills/subjective fever  -will continue with ceftriaxone to cover for bronchitis and UTI.  -continue saline spray and loratadine  - pulmicort BID   3-HYPERTENSION: will continue diltiazem  -BP well controlled   4-Atrial fibrillation: Resume  Amiodarone per cardio recommendation. Stop pindolol. Will decrease Cardizem to 120 daily to avoid further bradycardia.   5-GERD: continue PPI and QHS pepcid  6-Pulmonary interstitial fibrosis/?COPD: will continue PRN nebulizer. Started pulmicort and will treat underlying bronchitis  7-DVT: on coumadin 8-Increase TSH; will check Free T 3 and T 4.  9-Hypophosphatemia; replete with oral supplement.   Code Status: DNR Family Communication: Care discussed with patient.  Disposition Plan: Remain inpatient.    Consultants:  Cardiology, EP.   Procedures:  ECHO; pending.   Antibiotics:  Ceftriaxone 4-14  HPI/Subjective: She is feeling weak and tired. She had another episode of dizziness last night when she tried to stand up.   Objective: Filed Vitals:   01/12/14 0515  BP: 156/66  Pulse: 66  Temp:   Resp:     Intake/Output Summary (Last 24 hours) at 01/12/14 0854 Last data filed at  01/12/14 0618  Gross per 24 hour  Intake    400 ml  Output      0 ml  Net    400 ml   Filed Weights   01/11/14 1300 01/12/14 0514  Weight: 60.4 kg (133 lb 2.5 oz) 60.3 kg (132 lb 15 oz)    Exam:   General:  No distress.   Cardiovascular: S 1, S 2 RRR  Respiratory: fine crackles bases.   Abdomen: Bs present, soft, NT  Musculoskeletal: no edema.   Data Reviewed: Basic Metabolic Panel:  Recent Labs Lab 01/11/14 0925 01/11/14 2000 01/12/14 0530  NA 141  --  142  K 4.5  --  3.9  CL 103  --  106  CO2 25  --  24  GLUCOSE 82  --  89  BUN 19  --  11  CREATININE 0.96  --  0.78  CALCIUM 9.0  --  8.9  MG  --  1.9  --   PHOS  --  2.0*  --    Liver Function Tests:  Recent Labs Lab 01/11/14 0925 01/12/14 0530  AST 33 24  ALT 19 16  ALKPHOS 55 55  BILITOT 0.4 0.3  PROT 6.9 6.3  ALBUMIN 3.7 3.3*   No results found for this basename: LIPASE, AMYLASE,  in the last 168 hours No results found for this basename: AMMONIA,  in the last 168 hours CBC:  Recent Labs Lab 01/11/14 0925 01/11/14 0935 01/12/14 0530  WBC TEST REQUEST RECEIVED WITHOUT APPROPRIATE SPECIMEN 6.7 6.3  NEUTROABS PENDING 4.8  --   HGB TEST REQUEST RECEIVED WITHOUT APPROPRIATE SPECIMEN 13.9 12.6  HCT TEST REQUEST RECEIVED WITHOUT APPROPRIATE SPECIMEN 41.9  37.9  MCV TEST REQUEST RECEIVED WITHOUT APPROPRIATE SPECIMEN 96.3 95.7  PLT TEST REQUEST RECEIVED WITHOUT APPROPRIATE SPECIMEN 148* 160   Cardiac Enzymes:  Recent Labs Lab 01/11/14 2000  TROPONINI <0.30   BNP (last 3 results) No results found for this basename: PROBNP,  in the last 8760 hours CBG: No results found for this basename: GLUCAP,  in the last 168 hours  Recent Results (from the past 240 hour(s))  CULTURE, BLOOD (ROUTINE X 2)     Status: None   Collection Time    01/11/14  9:20 AM      Result Value Ref Range Status   Specimen Description BLOOD RIGHT FOREARM   Final   Special Requests BOTTLES DRAWN AEROBIC AND ANAEROBIC 5CC    Final   Culture  Setup Time     Final   Value: 01/11/2014 16:18     Performed at [facility]   Culture     Final   Value:        BLOOD CULTURE RECEIVED NO GROWTH TO DATE CULTURE WILL BE HELD FOR 5 DAYS BEFORE ISSUING A FINAL NEGATIVE REPORT     Performed at [facility]   Report Status PENDING   Incomplete  CULTURE, BLOOD (ROUTINE X 2)     Status: None   Collection Time    01/11/14  9:35 AM      Result Value Ref Range Status   Specimen Description BLOOD LEFT ANTECUBITAL   Final   Special Requests BOTTLES DRAWN AEROBIC AND ANAEROBIC 10MLS   Final   Culture  Setup Time     Final   Value: 01/11/2014 16:18     Performed at [facility]   Culture     Final   Value:        BLOOD CULTURE RECEIVED NO GROWTH TO DATE CULTURE WILL BE HELD FOR 5 DAYS BEFORE ISSUING A FINAL NEGATIVE REPORT     Performed at [facility]   Report Status PENDING   Incomplete  URINE CULTURE     Status: None   Collection Time    01/11/14 10:06 AM      Result Value Ref Range Status   Specimen Description URINE, CATHETERIZED   Final   Special Requests NONE   Final   Culture  Setup Time     Final   Value: 01/11/2014 10:27     Performed at [facility]     Final   Value: >=100,000 COLONIES/ML     Performed at [facility]   Culture     Final   Value: STREPTOCOCCUS SPECIES     Performed at [facility]   Report Status PENDING   Incomplete     Studies: Ct Head Wo Contrast  01/11/2014   CLINICAL DATA:  Pain post trauma  EXAM: CT HEAD WITHOUT CONTRAST  TECHNIQUE: Contiguous axial images were obtained from the base of the skull through the vertex without intravenous contrast.  COMPARISON:  October 28, 2013  FINDINGS: There is moderate diffuse atrophy. There is no mass, hemorrhage, extra-axial fluid collection, or midline shift. There is patchy small vessel disease in the centra semiovale bilaterally, stable. There is no new gray-white compartment  lesion. No acute infarct apparent.  Bony calvarium appears intact.  The mastoid air cells are clear.  IMPRESSION: Atrophy with periventricular small vessel disease. No intracranial mass, hemorrhage, or extra-axial fluid. There is no acute appearing infarct.   Electronically  Signed   By: Lowella Grip M.D.   On: 01/11/2014 11:08   Dg Chest Port 1 View  01/11/2014   CLINICAL DATA:  Atrial fibrillation  EXAM: PORTABLE CHEST - 1 VIEW  COMPARISON:  NM PET IMAGE INITIAL (PI) SKULL BASE TO THIGH dated 12/09/2013; DG CHEST 2 VIEW dated 11/29/2013  FINDINGS: Normal cardiac silhouette with ectatic aorta. There is chronic bronchitic change and interstitial thickening similar to comparison PET-CT scan. No focal consolidation. No pneumothorax  IMPRESSION: Chronic bronchitic change and interstitial thickening. No acute findings   Electronically Signed   By: Suzy Bouchard M.D.   On: 01/11/2014 09:42    Scheduled Meds: . amiodarone  100 mg Oral Daily  . atorvastatin  20 mg Oral q1800  . budesonide (PULMICORT) nebulizer solution  0.25 mg Nebulization BID  . calcium-vitamin D  1 tablet Oral Daily  . diltiazem  180 mg Oral Daily  . doxycycline  100 mg Oral Q12H  . ezetimibe  10 mg Oral Daily  . famotidine  40 mg Oral QHS  . folic acid  1 mg Oral Daily  . gabapentin  100 mg Oral BID  . loratadine  10 mg Oral Daily  . multivitamin with minerals  1 tablet Oral Daily  . omega-3 acid ethyl esters  1 g Oral Daily  . pantoprazole  40 mg Oral Q1200  . phosphorus  250 mg Oral Daily  . pyridOXINE  100 mg Oral Daily  . sodium chloride  3 mL Intravenous Q12H  . sodium chloride  3 mL Intravenous Q12H  . Warfarin - Pharmacist Dosing Inpatient   Does not apply q1800   Continuous Infusions: . sodium chloride 1,000 mL (01/11/14 1500)    Principal Problem:   Syncopal episodes Active Problems:   HYPERTENSION   Atrial fibrillation   GERD   Pulmonary interstitial fibrosis   Acute bronchitis   Syncope and  collapse    Time spent: 35 minutes.     [facility] Pager [phone]. If 7PM-7AM, please contact night-coverage at [email or web address], password [email or web address] 01/12/2014, 8:54 AM  LOS: 1 day

## 2014-01-13 ENCOUNTER — Observation Stay (HOSPITAL_COMMUNITY): Payer: Medicare Other

## 2014-01-13 LAB — CBC
HEMATOCRIT: 38.6 % (ref 36.0–46.0)
HEMOGLOBIN: 12.9 g/dL (ref 12.0–15.0)
MCH: 31.7 pg (ref 26.0–34.0)
MCHC: 33.4 g/dL (ref 30.0–36.0)
MCV: 94.8 fL (ref 78.0–100.0)
Platelets: 156 10*3/uL (ref 150–400)
RBC: 4.07 MIL/uL (ref 3.87–5.11)
RDW: 13 % (ref 11.5–15.5)
WBC: 6.1 10*3/uL (ref 4.0–10.5)

## 2014-01-13 LAB — BASIC METABOLIC PANEL
BUN: 13 mg/dL (ref 6–23)
CALCIUM: 9.2 mg/dL (ref 8.4–10.5)
CO2: 24 meq/L (ref 19–32)
Chloride: 110 mEq/L (ref 96–112)
Creatinine, Ser: 0.85 mg/dL (ref 0.50–1.10)
GFR calc Af Amer: 69 mL/min — ABNORMAL LOW (ref >90)
GFR calc non Af Amer: 60 mL/min — ABNORMAL LOW (ref >90)
GLUCOSE: 91 mg/dL (ref 70–99)
Potassium: 4.2 mEq/L (ref 3.7–5.3)
SODIUM: 144 meq/L (ref 137–147)

## 2014-01-13 LAB — PROTIME-INR
INR: 2.04 — ABNORMAL HIGH (ref 0.00–1.49)
Prothrombin Time: 22.4 seconds — ABNORMAL HIGH (ref 11.6–15.2)

## 2014-01-13 LAB — URINE CULTURE: Colony Count: >=100000

## 2014-01-13 LAB — PHOSPHORUS: Phosphorus: 2.9 mg/dL (ref 2.3–4.6)

## 2014-01-13 MED ORDER — WARFARIN SODIUM 5 MG PO TABS
5.0000 mg | ORAL_TABLET | Freq: Every day | ORAL | Status: DC
  Filled 2014-01-13: qty 1

## 2014-01-13 MED ORDER — BOOST PLUS PO LIQD: 237.0000 mL | Freq: Two times a day (BID) | ORAL | Status: DC

## 2014-01-13 MED ORDER — DILTIAZEM HCL ER COATED BEADS 120 MG PO CP24: 120.0000 mg | ORAL_CAPSULE | Freq: Every day | ORAL | Status: DC

## 2014-01-13 MED ORDER — BUDESONIDE 0.25 MG/2ML IN SUSP: 0.2500 mg | Freq: Two times a day (BID) | RESPIRATORY_TRACT | Status: DC

## 2014-01-13 MED ORDER — CEPHALEXIN 250 MG PO CAPS: 250.0000 mg | ORAL_CAPSULE | Freq: Four times a day (QID) | ORAL | Status: DC

## 2014-01-13 MED ORDER — FOSFOMYCIN TROMETHAMINE 3 G PO PACK
3.0000 g | PACK | Freq: Once | ORAL | Status: DC
  Filled 2014-01-13: qty 3

## 2014-01-13 MED ORDER — DSS 100 MG PO CAPS: 100.0000 mg | ORAL_CAPSULE | Freq: Two times a day (BID) | ORAL | Status: DC

## 2014-01-13 NOTE — Progress Notes (Signed)
CSW (Clinical Education officer, museum) confirmed with [facility] that pt is independent living and can return if appropriate. CSW spoke with facility about PT recommendation. Facility okay with pt discharging when stable. Pt has no CSW needs at this time. Please consult if needed.  [place], [place]

## 2014-01-13 NOTE — Care Management (Signed)
[place] 01-13-14 Jun Rightmyer Kaye [place] , [place] [phone] CM did place epic referral with [facility] Re[facility]., No further needs at this time.

## 2014-01-13 NOTE — Progress Notes (Signed)
ANTICOAGULATION CONSULT NOTE - Follow Up Consult  Pharmacy Consult for Coumadin Indication: atrial fibrillation  Allergies  Allergen Reactions  . Other Nausea And Vomiting    Any kind of BELL PEPPER.   Extreme, violent nausea and vomiting quickly leading to dehydration  . Codeine Nausea And Vomiting       . Penicillins Nausea And Vomiting  . Sulfa Antibiotics Itching    Patient Measurements: Height: 5\' 6"  (167.6 cm) Weight: 141 lb 1.9 oz (64.01 kg) IBW/kg (Calculated) : 59.3  Vital Signs: Temp: 98.8 F (37.1 C) (04/16 0555) BP: 122/61 mmHg (04/16 1009) Pulse Rate: 54 (04/16 1106)  Labs:  Recent Labs  01/11/14 0925 01/11/14 0935 01/11/14 2000 01/12/14 0530 01/13/14 0310  HGB TEST REQUEST RECEIVED WITHOUT APPROPRIATE SPECIMEN 13.9  --  12.6 12.9  HCT TEST REQUEST RECEIVED WITHOUT APPROPRIATE SPECIMEN 41.9  --  37.9 38.6  PLT TEST REQUEST RECEIVED WITHOUT APPROPRIATE SPECIMEN 148*  --  160 156  LABPROT 17.2*  --   --  19.2* 22.4*  INR 1.44  --   --  1.67* 2.04*  CREATININE 0.96  --   --  0.78 0.85  TROPONINI  --   --  <0.30  --   --     Estimated Creatinine Clearance: 43.7 ml/min (by C-G formula based on Cr of 0.85).  Assessment:   INR 1.44->1.67->2.04 after Coumadin 7.5 mg x 2 days.   Home Coumadin regimen: 5 mg daily.   Day#3 Ceftriaxone. Dose given this am. Enterococcus in urine.  Sensitive to Ampicillin, nitrofurantion & tetracycline, intermediate to Levaquin. Pencillin allergy - nausea/vomiting.  Goal of Therapy:  INR 2-3 Monitor platelets by anticoagulation protocol: Yes   Plan:   Resume Coumadin 5 mg daily at 6pm.  Continue daily PT/INR.    Fosfomycin 3gm PO x 1?  Arty Baumgartner,  Pager: [phone] 01/13/2014,12:23 PM

## 2014-01-13 NOTE — Discharge Summary (Signed)
Physician Discharge Summary  Anne Hayes EVO:[id number] DOB: 10/18/1923 DOA: 01/11/2014  PCP: Mathews Argyle, MD  Admit date: 01/11/2014 Discharge date: 01/13/2014  Time spent: 35 minutes  Recommendations for Outpatient Follow-up:  1. Follow up BP, orthostatic vitals. Adjust BP medications as needed 2. Need repeat TSH 3. Need MRA brain. See MRI results. 4.   Discharge Diagnoses:    Syncopal episodes, secondary to orthostatic hypotension   HYPERTENSION   Atrial fibrillation   GERD   Pulmonary interstitial fibrosis   Acute bronchitis   Syncope and collapse   Discharge Condition: Stable.   Diet recommendation: Heart Healthy  Filed Weights   01/11/14 1300 01/12/14 0514 01/13/14 0555  Weight: 60.4 kg (133 lb 2.5 oz) 60.3 kg (132 lb 15 oz) 64.01 kg (141 lb 1.9 oz)    History of present illness:  Anne Hayes is a 80 y.o. female with pmh significant for GERD, HTN, atrial fibrillation, hx of CVA (no residual deficit and insomnia; came to ED after experiencing 2 episodes of syncope in the last 36 hours. Patient endorses some cough, PND, chills and subjective fever, that has been present for the last 3-4 days prior to admission. Patient reports no CP, no lightheadedness, no dizziness, no palpitations, no SOB, dysuria, abd pain, N/V or decrease PO intake. Troponin in ED negative, EKG w/o ischemic changes and CT head w/o acute intracranial abnormalities. CXR showing bronchitic changes but not infiltrates. [facility] called to admit for further evaluation and treatment.   Hospital Course:  1-Syncopal episode: patient reports 2 episode of passing out, no prodromal symptoms. Had hx of atrial fibrillation s/p ablation and recent bradycardia with decrease amiodarone and diltiazem (approx 1 week PTA) to help with HR.  -orthostatic vital initially positive. Improved after IV fluids.  -cardiology EP (consulted). Dr Rayann Heman doesn't think Bradycardia is not the  cause of symptoms.  -vit D  pending. , B-12 at 570.  -CT head neg for acute intracranial abnormalities  -PT evaluation.  -MRI negative for Acute stroke.   2-acute bronchitis: bronchitic changes seen on CXR; no acute infiltrates; but patient reproting, productive cough and chills/subjective fever  -received ceftriaxone to cover for bronchitis and UTI. Transition to keflex. -continue saline spray and loratadine  - pulmicort BID  3-HYPERTENSION: will continue diltiazem  -BP well controlled  4-Atrial fibrillation: Resume Amiodarone per cardio recommendation. Stop pindolol. Will decrease Cardizem to 120 daily to avoid further bradycardia.  5-GERD: continue PPI and QHS pepcid  6-Pulmonary interstitial fibrosis/?COPD: will continue PRN nebulizer. Started pulmicort and will treat underlying bronchitis . Need repeat chest x ray.  7-DVT: on coumadin  8-Increase TSH; normal Free T 3 and T 4. Need repeat TSH in 4 weeks.  9-Hypophosphatemia; replete with oral supplement. Resolved.  10-enterococcus UTI; received one dose of fosfomycin.   Procedures: ECHO; Left ventricle: The cavity size was normal. Wall thickness was increased in a pattern of mild LVH. Systolic function was normal. The estimated ejection fraction was in the range of 60% to 65%. Wall motion was normal; there were no regional wall motion abnormalities. Left ventricular diastolic function parameters were normal.    Consultations:  Cardiology  Discharge Exam: Filed Vitals:   01/13/14 1106  BP:   Pulse: 54  Temp:   Resp:     General: No distress.  Cardiovascular: S 1, S 2 RRR Respiratory: CTA  Discharge Instructions You were cared for by a hospitalist during your hospital stay. If you have any questions about your discharge medications or  the care you received while you were in the hospital after you are discharged, you can call the unit and asked to speak with the hospitalist on call if the hospitalist that took care of you is not available. Once  you are discharged, your primary care physician will handle any further medical issues. Please note that NO REFILLS for any discharge medications will be authorized once you are discharged, as it is imperative that you return to your primary care physician (or establish a relationship with a primary care physician if you do not have one) for your aftercare needs so that they can reassess your need for medications and monitor your lab values.      Discharge Orders   Future Appointments Provider Department Dept Phone   01/17/2014 9:30 AM [facility] [place] [facility] [phone]   01/24/2014 3:00 PM Kendell Bane, [place] [facility] [place]   02/07/2014 3:00 PM Noralee Space, MD [facility] [phone]   03/04/2014 12:00 PM Thompson Grayer, MD [facility] [phone]   Future Orders Complete By Expires   Diet - low sodium heart healthy  As directed    Increase activity slowly  As directed        Medication List    STOP taking these medications       amLODipine 2.5 MG tablet  Commonly known as:  NORVASC      TAKE these medications       acetaminophen 500 MG tablet  Commonly known as:  TYLENOL  Take 1,000 mg by mouth every 6 (six) hours as needed for mild pain or headache.     AMBIEN 10 MG tablet  Generic drug:  zolpidem  Take 10 mg by mouth at bedtime. For sleep     amiodarone 200 MG tablet  Commonly known as:  PACERONE  Take 100 mg by mouth daily.     aspirin 81 MG tablet  Take 81 mg by mouth daily.     budesonide 0.25 MG/2ML nebulizer solution  Commonly known as:  PULMICORT  Take 2 mLs (0.25 mg total) by nebulization 2 (two) times daily.     calcium-vitamin D 500-200 MG-UNIT per tablet  Commonly known as:  OSCAL WITH D  Take 1 tablet by mouth daily.     cephALEXin 250 MG capsule  Commonly known as:  KEFLEX  Take 1 capsule (250 mg total) by mouth 4 (four) times daily.     Co Q-10 100 MG Caps   Take 100 mg by mouth daily.     diltiazem 120 MG 24 hr capsule  Commonly known as:  CARDIZEM CD  Take 1 capsule (120 mg total) by mouth daily.     donepezil 10 MG tablet  Commonly known as:  ARICEPT  Take 10 mg by mouth at bedtime.     DSS 100 MG Caps  Take 100 mg by mouth 2 (two) times daily.     ezetimibe 10 MG tablet  Commonly known as:  ZETIA  Take 10 mg by mouth daily.     folic acid 1 MG tablet  Commonly known as:  FOLVITE  Take 1 mg by mouth daily.     gabapentin 100 MG capsule  Commonly known as:  NEURONTIN  Take 300 mg by mouth 2 (two) times daily.     lactose free nutrition Liqd  Take 237 mLs by mouth 2 (two) times daily between meals.     multivitamin with  minerals Tabs tablet  Take 1 tablet by mouth daily.     omega-3 acid ethyl esters 1 G capsule  Commonly known as:  LOVAZA  Take 1 g by mouth daily.     pravastatin 80 MG tablet  Commonly known as:  PRAVACHOL  Take 80 mg by mouth every evening.     pyridOXINE 100 MG tablet  Commonly known as:  VITAMIN B-6  Take 100 mg by mouth daily.     ranitidine 300 MG capsule  Commonly known as:  ZANTAC  Take 300 mg by mouth every evening.     SYSTANE ULTRA OP  Place 1 drop into both eyes daily as needed (dry eyes).     warfarin 5 MG tablet  Commonly known as:  COUMADIN  Take 5 mg by mouth daily.       Allergies  Allergen Reactions  . Other Nausea And Vomiting    Any kind of BELL PEPPER.   Extreme, violent nausea and vomiting quickly leading to dehydration  . Codeine Nausea And Vomiting       . Penicillins Nausea And Vomiting  . Sulfa Antibiotics Itching   Follow-up Information   Follow up with [facility] On 01/17/2014. (At 9:30 AM to pick up heart monitor)    Specialty:  Cardiology   Contact information:   [address], [place] [address] [phone]      Follow up with Thompson Grayer, MD On 03/04/2014. (At 12:00 noon)    Specialty:  Cardiology    Contact information:   [place] [address] [phone]       Follow up with Mathews Argyle, MD.   Specialty:  Internal Medicine   Contact information:   [address] [place] [address] [phone]       Follow up with [facility]. (For Physical Therapy- Vestibular Rehab. )    Specialty:  Neuro-Rehabilitation   Contact information:   [address] [place] [id number] [place] [address] [phone]       The results of significant diagnostics from this hospitalization (including imaging, microbiology, ancillary and laboratory) are listed below for reference.    Significant Diagnostic Studies: Ct Head Wo Contrast  01/11/2014   CLINICAL DATA:  Pain post trauma  EXAM: CT HEAD WITHOUT CONTRAST  TECHNIQUE: Contiguous axial images were obtained from the base of the skull through the vertex without intravenous contrast.  COMPARISON:  October 28, 2013  FINDINGS: There is moderate diffuse atrophy. There is no mass, hemorrhage, extra-axial fluid collection, or midline shift. There is patchy small vessel disease in the centra semiovale bilaterally, stable. There is no new gray-white compartment lesion. No acute infarct apparent.  Bony calvarium appears intact.  The mastoid air cells are clear.  IMPRESSION: Atrophy with periventricular small vessel disease. No intracranial mass, hemorrhage, or extra-axial fluid. There is no acute appearing infarct.   Electronically Signed   By: Lowella Grip M.D.   On: 01/11/2014 11:08   Mr Brain Wo Contrast  01/13/2014   CLINICAL DATA:  Syncopal episode. History of atrial fibrillation. Dizziness. History of renal cancer. Hypertension and hyperlipidemia.  EXAM: MRI HEAD WITHOUT CONTRAST  TECHNIQUE: Multiplanar, multiecho pulse sequences of the brain and surrounding structures were obtained without intravenous contrast.  COMPARISON:  01/11/2014 CT.  12/24/2006  MR.  FINDINGS: No acute infarct.  No intracranial hemorrhage.  Small vessel disease type changes.  Global atrophy  without hydrocephalus.  Prominent vessels right middle cerebral artery distribution. Etiology indeterminate. Underlying vascular malformation not excluded. Contrast enhanced MR and MR angiogram can be obtained for further delineation if clinically desired.  No intracranial mass lesion otherwise noted on this unenhanced exam.  Mild transverse ligament hypertrophy. Cervical medullary junction, pituitary region, pineal region and orbital structures unremarkable.  IMPRESSION: No acute infarct.  Small vessel disease type changes.  Global atrophy without hydrocephalus.  Prominent vessels right middle cerebral artery distribution. Etiology indeterminate. Underlying vascular malformation not excluded. Contrast enhanced MR and MR angiogram can be obtained for further delineation if clinically desired.   Electronically Signed   By: Chauncey Cruel M.D.   On: 01/13/2014 08:51   Dg Chest Port 1 View  01/11/2014   CLINICAL DATA:  Atrial fibrillation  EXAM: PORTABLE CHEST - 1 VIEW  COMPARISON:  NM PET IMAGE INITIAL (PI) SKULL BASE TO THIGH dated 12/09/2013; DG CHEST 2 VIEW dated 11/29/2013  FINDINGS: Normal cardiac silhouette with ectatic aorta. There is chronic bronchitic change and interstitial thickening similar to comparison PET-CT scan. No focal consolidation. No pneumothorax  IMPRESSION: Chronic bronchitic change and interstitial thickening. No acute findings   Electronically Signed   By: Suzy Bouchard M.D.   On: 01/11/2014 09:42    Microbiology: Recent Results (from the past 240 hour(s))  CULTURE, BLOOD (ROUTINE X 2)     Status: None   Collection Time    01/11/14  9:20 AM      Result Value Ref Range Status   Specimen Description BLOOD RIGHT FOREARM   Final   Special Requests BOTTLES DRAWN AEROBIC AND ANAEROBIC 5CC   Final   Culture  Setup Time     Final   Value: 01/11/2014 16:18     Performed at  [facility]   Culture     Final   Value:        BLOOD CULTURE RECEIVED NO GROWTH TO DATE CULTURE WILL BE HELD FOR 5 DAYS BEFORE ISSUING A FINAL NEGATIVE REPORT     Performed at [facility]   Report Status PENDING   Incomplete  CULTURE, BLOOD (ROUTINE X 2)     Status: None   Collection Time    01/11/14  9:35 AM      Result Value Ref Range Status   Specimen Description BLOOD LEFT ANTECUBITAL   Final   Special Requests BOTTLES DRAWN AEROBIC AND ANAEROBIC 10MLS   Final   Culture  Setup Time     Final   Value: 01/11/2014 16:18     Performed at [facility]   Culture     Final   Value:        BLOOD CULTURE RECEIVED NO GROWTH TO DATE CULTURE WILL BE HELD FOR 5 DAYS BEFORE ISSUING A FINAL NEGATIVE REPORT     Performed at [facility]   Report Status PENDING   Incomplete  URINE CULTURE     Status: None   Collection Time    01/11/14 10:06 AM      Result Value Ref Range Status   Specimen Description URINE, CATHETERIZED   Final   Special Requests NONE   Final   Culture  Setup Time     Final   Value: 01/11/2014 10:27     Performed at [facility]     Final   Value: >=100,000 COLONIES/ML     Performed at [facility]  Final   Value: ENTEROCOCCUS SPECIES     Performed at [facility]   Report Status 01/13/2014 FINAL   Final   Organism ID, Bacteria ENTEROCOCCUS SPECIES   Final     Labs: Basic Metabolic Panel:  Recent Labs Lab 01/11/14 0925 01/11/14 2000 01/12/14 0530 01/13/14 0310  NA 141  --  142 144  K 4.5  --  3.9 4.2  CL 103  --  106 110  CO2 25  --  24 24  GLUCOSE 82  --  89 91  BUN 19  --  11 13  CREATININE 0.96  --  0.78 0.85  CALCIUM 9.0  --  8.9 9.2  MG  --  1.9  --   --   PHOS  --  2.0*  --  2.9   Liver Function Tests:  Recent Labs Lab 01/11/14 0925 01/12/14 0530  AST 33 24  ALT 19 16  ALKPHOS 55 55  BILITOT 0.4 0.3  PROT 6.9 6.3  ALBUMIN 3.7 3.3*   No results found  for this basename: LIPASE, AMYLASE,  in the last 168 hours No results found for this basename: AMMONIA,  in the last 168 hours CBC:  Recent Labs Lab 01/11/14 0925 01/11/14 0935 01/12/14 0530 01/13/14 0310  WBC TEST REQUEST RECEIVED WITHOUT APPROPRIATE SPECIMEN 6.7 6.3 6.1  NEUTROABS PENDING 4.8  --   --   HGB TEST REQUEST RECEIVED WITHOUT APPROPRIATE SPECIMEN 13.9 12.6 12.9  HCT TEST REQUEST RECEIVED WITHOUT APPROPRIATE SPECIMEN 41.9 37.9 38.6  MCV TEST REQUEST RECEIVED WITHOUT APPROPRIATE SPECIMEN 96.3 95.7 94.8  PLT TEST REQUEST RECEIVED WITHOUT APPROPRIATE SPECIMEN 148* 160 156   Cardiac Enzymes:  Recent Labs Lab 01/11/14 2000  TROPONINI <0.30   BNP: BNP (last 3 results) No results found for this basename: PROBNP,  in the last 8760 hours CBG: No results found for this basename: GLUCAP,  in the last 168 hours     Signed:  Telisha Zawadzki A Brittay Mogle  [facility] 01/13/2014, 2:45 PM

## 2014-01-13 NOTE — Progress Notes (Signed)
Physical Therapy Treatment Patient Details Name: Anne Hayes MRN: [id number] DOB: May 05, 1924 Today's Date: 01/13/2014    History of Present Illness 80 y.o. female admitted to [facility] on 01/11/14 after two syncopal episodes.  Pt has had several falls recently including one with head trauma to her left orbit.  Differential dx includes symptomatic bradycardia, orthostatic hypotension, and/or bronchitis.  CT head was negative for acute event.  Pt with significant PMHx of HTN, A-fib, CVA (with no residual deficits), peripherial neuropathy, chronic low back pain, and R ankle fx (closed, non-displaced treated with a CAM boot).  Pt also reports long standing h/o double vision (she closes her left eye to compensate and wears glasses that also help).  MRI negative.      PT Comments    Pt's Marye Round negative today (for both rotational nystagmus and symptoms), so I did not feel the need to repeat Epley's maneuver on her.  She does continue to demonstrate poor dynamic balance during gait even with cane which is what she uses on/off at home.  I continue to encourage her to go to OP neurorehab to have her vestibular system checked again and to then stay for balance training even if the vertigo is gone.  She and her daughter is agreeable. We had a long discussion about fall precautions and my continued belief that she would avoid more falls if she used a RW instead of her cane.  We also tried to discuss her double vision, but she give very confusing/poor descriptors of it when asked by her daughter and me.  She has glasses that apparently are made to help with her double vision, but she doesn't wear them (because she doesn't feel they help).  Anyway, I encouraged f/u with the eye doctor.   Follow Up Recommendations  Outpatient PT (for balance and vestibular training)     Equipment Recommendations  None recommended by PT    Recommendations for Other Services   NA     Precautions / Restrictions  Precautions Precautions: Fall Precaution Comments: h/o frequent falls    Mobility  Bed Mobility Overal bed mobility: Modified Independent                Transfers Overall transfer level: Needs assistance Equipment used: Straight cane Transfers: Sit to/from Stand Sit to Stand: Supervision         General transfer comment: supervision for safety as she is still unsteady on her feet  Ambulation/Gait Ambulation/Gait assistance: Mod assist Ambulation Distance (Feet): 200 Feet Assistive device: Straight cane Gait Pattern/deviations: Step-through pattern;Staggering left;Staggering right Gait velocity: decreased Gait velocity interpretation: Below normal speed for age/gender General Gait Details: Pt with one significant LOB in the hallway while walking with cane requiring mod assist to prevent complete LOB and fall to the floor.  Pt reports if she had her cane (not my cane), then she would have been better able to catch herself.  I continued to endorse to both her and her daughter that RW would be safe and she would not stumble with the RW and have to catch herself.  Pt continued to argue that she has never had a fall when she used her cane (so I reinforced that if she was not going to use the RW she needed to use the cane at all times both inside and outside of her apartment).  Pt was more agreeable to this.            Balance Overall balance assessment: Needs assistance Sitting-balance  support: Feet supported Sitting balance-Leahy Scale: Good     Standing balance support: Single extremity supported Standing balance-Leahy Scale: Poor                      Cognition Arousal/Alertness: Awake/alert Behavior During Therapy: WFL for tasks assessed/performed Overall Cognitive Status: Within Functional Limits for tasks assessed                         General Comments General comments (skin integrity, edema, etc.): Re-tested her R ear for posterior canal  BPPV and it was completely clear with dix hallpike testing today, so I did not proceed with the Epley's.  I continued to recommend she go to her appointment at OP Neurorehab to be re-checked again for vestibular dysfunction and then to continue with them if clear for vestibular, for balance training.  Her daughter was especially agreeable to this.       Pertinent Vitals/Pain See vitals flow sheet.            PT Goals (current goals can now be found in the care plan section) Acute Rehab PT Goals Patient Stated Goal: to stop falling Progress towards PT goals: Progressing toward goals    Frequency  Min 4X/week    PT Plan Current plan remains appropriate       End of Session   Activity Tolerance: Patient tolerated treatment well Patient left: in chair;with call bell/phone within reach;with family/visitor present     Time: 8144-8185 PT Time Calculation (min): 42 min  Charges:  $Gait Training: 8-22 mins $Self Care/Home Management: 23-37                    G Codes:  Functional Assessment Tool Used: assist level Functional Limitation: Mobility: Walking and moving around Mobility: Walking and Moving Around Goal Status [phone]): At least 1 percent but less than 20 percent impaired, limited or restricted Mobility: Walking and Moving Around Discharge Status [phone]): At least 1 percent but less than 20 percent impaired, limited or restricted   Jenni Thew B. [place], [place], DPT [phone]   01/13/2014, 1:55 PM

## 2014-01-14 ENCOUNTER — Ambulatory Visit (INDEPENDENT_AMBULATORY_CARE_PROVIDER_SITE_OTHER): Payer: Medicare Other | Admitting: *Deleted

## 2014-01-14 DIAGNOSIS — Z7901 Long term (current) use of anticoagulants: Secondary | ICD-10-CM

## 2014-01-14 DIAGNOSIS — Z5181 Encounter for therapeutic drug level monitoring: Secondary | ICD-10-CM

## 2014-01-14 DIAGNOSIS — I4891 Unspecified atrial fibrillation: Secondary | ICD-10-CM

## 2014-01-14 LAB — POCT INR: INR: 2.6

## 2014-01-14 NOTE — Patient Instructions (Signed)
Discontinue Amlodipine ( Norvasc)

## 2014-01-17 ENCOUNTER — Telehealth: Payer: Self-pay | Admitting: Internal Medicine

## 2014-01-17 LAB — CULTURE, BLOOD (ROUTINE X 2)
CULTURE: NO GROWTH
Culture: NO GROWTH

## 2014-01-17 NOTE — Telephone Encounter (Signed)
New message     Pt want Claiborne Billings or Dr Rayann Heman only.  She was discharged from the hospital last Friday--or near that.  Heart rate is 52.  She was in the hosp for passing out.  She want Claiborne Billings or Dr Rayann Heman only---pt knows they are out today.  OK to call tomorrow

## 2014-01-18 NOTE — Telephone Encounter (Signed)
Spoke with patient and let her know to get the monitor and if she is still having symptoms and feels like she has been we can look at the monitor and see what it shows  She agrees with plan and will call me if needed

## 2014-01-19 ENCOUNTER — Other Ambulatory Visit: Payer: Self-pay | Admitting: *Deleted

## 2014-01-19 ENCOUNTER — Encounter: Payer: Self-pay | Admitting: *Deleted

## 2014-01-19 ENCOUNTER — Encounter (INDEPENDENT_AMBULATORY_CARE_PROVIDER_SITE_OTHER): Payer: Medicare Other

## 2014-01-19 DIAGNOSIS — R55 Syncope and collapse: Secondary | ICD-10-CM

## 2014-01-19 DIAGNOSIS — I4891 Unspecified atrial fibrillation: Secondary | ICD-10-CM

## 2014-01-19 LAB — VITAMIN D 1,25 DIHYDROXY
VITAMIN D 1, 25 (OH) TOTAL: 47 pg/mL (ref 18–72)
Vitamin D3 1, 25 (OH)2: 47 pg/mL

## 2014-01-19 NOTE — Progress Notes (Unsigned)
Patient ID: Anne Hayes, female   DOB: 1923-12-01, 80 y.o.   MRN: [id number] E-Cardio Verite 30 day cardiac event monitor applied to patient.  Patient complained [employer] blackberry screen to small to see.  She has also used the [facility] monitor in the past and felt more comfortable using it.

## 2014-01-24 ENCOUNTER — Ambulatory Visit (INDEPENDENT_AMBULATORY_CARE_PROVIDER_SITE_OTHER): Payer: Medicare Other | Admitting: *Deleted

## 2014-01-24 ENCOUNTER — Ambulatory Visit (INDEPENDENT_AMBULATORY_CARE_PROVIDER_SITE_OTHER): Payer: Medicare Other | Admitting: Podiatry

## 2014-01-24 ENCOUNTER — Encounter: Payer: Self-pay | Admitting: Podiatry

## 2014-01-24 VITALS — BP 149/72 | HR 51 | Resp 15 | Ht 66.0 in | Wt 134.0 lb

## 2014-01-24 DIAGNOSIS — Z5181 Encounter for therapeutic drug level monitoring: Secondary | ICD-10-CM

## 2014-01-24 DIAGNOSIS — Z7901 Long term (current) use of anticoagulants: Secondary | ICD-10-CM

## 2014-01-24 DIAGNOSIS — M722 Plantar fascial fibromatosis: Secondary | ICD-10-CM

## 2014-01-24 DIAGNOSIS — I4891 Unspecified atrial fibrillation: Secondary | ICD-10-CM

## 2014-01-24 LAB — POCT INR: INR: 1.9

## 2014-01-24 NOTE — Patient Instructions (Signed)
Over-the-counter 1/8-1/4 inch soft heel cushion to protect the heel from associated thinning of the fat pad and plantar fasciitis.

## 2014-01-24 NOTE — Progress Notes (Signed)
   Subjective:    Patient ID: Anne Hayes, female    DOB: [birth year], 80 y.o.   MRN: [id number]  HPI Comments: N callous L right heel pad D 3 to 4 weeks O C hard, painful skin A weightbearing T lotion, oil olive     Review of Systems  All other systems reviewed and are negative.      Objective:   Physical Exam 80 year old white female presents with caregiver it appears orientated x3  Vascular: DP and PT pulses 2/4 bilaterally  Neurological: Sensation to 10 g monofilament wire intact 1/5 right and 0/5 left. Vibratory sensation nonreactive bilaterally. Ankle reflexes reactive bilaterally.  Dermatological: Diffuse callus central right heel, with the overlying fat pad atrophic.  Musculoskeletal: Right HAV deformity noted        Assessment & Plan:   Assessment: Sensory neuropathy bilaterally Atrophy of plantar fat pad right heel Plantar fasciitis right Plantar callus right  Plan: Patient advised to insert a soft gel heel cushion in the right heel to accommodate the atrophic fat pad which is her primary problem.

## 2014-01-25 ENCOUNTER — Encounter: Payer: Self-pay | Admitting: Podiatry

## 2014-01-25 ENCOUNTER — Ambulatory Visit: Payer: Medicare Other | Attending: Internal Medicine | Admitting: Physical Therapy

## 2014-01-25 DIAGNOSIS — IMO0001 Reserved for inherently not codable concepts without codable children: Secondary | ICD-10-CM | POA: Insufficient documentation

## 2014-01-25 DIAGNOSIS — M6281 Muscle weakness (generalized): Secondary | ICD-10-CM | POA: Insufficient documentation

## 2014-01-25 DIAGNOSIS — R269 Unspecified abnormalities of gait and mobility: Secondary | ICD-10-CM | POA: Insufficient documentation

## 2014-01-31 ENCOUNTER — Ambulatory Visit: Payer: Medicare Other | Attending: Internal Medicine

## 2014-01-31 DIAGNOSIS — M6281 Muscle weakness (generalized): Secondary | ICD-10-CM | POA: Insufficient documentation

## 2014-01-31 DIAGNOSIS — IMO0001 Reserved for inherently not codable concepts without codable children: Secondary | ICD-10-CM | POA: Insufficient documentation

## 2014-01-31 DIAGNOSIS — R269 Unspecified abnormalities of gait and mobility: Secondary | ICD-10-CM | POA: Insufficient documentation

## 2014-02-02 ENCOUNTER — Ambulatory Visit: Payer: Medicare Other | Admitting: Physical Therapy

## 2014-02-07 ENCOUNTER — Ambulatory Visit: Payer: Medicare Other | Admitting: Physical Therapy

## 2014-02-07 ENCOUNTER — Ambulatory Visit: Payer: Medicare Other | Admitting: Pulmonary Disease

## 2014-02-08 ENCOUNTER — Ambulatory Visit: Payer: Medicare Other

## 2014-02-08 ENCOUNTER — Telehealth: Payer: Self-pay | Admitting: *Deleted

## 2014-02-08 NOTE — Telephone Encounter (Signed)
Pt came in with her caregiver stating that her BP was low and she has been dizzy today. Pt has a history of vertigo which she said her physical therapist said has cured. BP was taking right arm 148/58 left arm 150/70. Now pt states it was her HR too low epical heart rate is 52 beats/minute. On the last office visit on 08/03/13 pt's BP was 152/78 HR 58 beats/minute. Pt is wearing an event monitor at this time. Monitor was checked with Royal Piedra at Holter room  States there were no reports of low heart rate today. Pt was reassures. Pt was made aware that she is to push the button on the event monitor if she has any symptoms,. Pt is to keep her appointment with Dr. Lennart Pall on 03/04/14 pt verbalized understanding.

## 2014-02-09 ENCOUNTER — Ambulatory Visit: Payer: Medicare Other | Admitting: Physical Therapy

## 2014-02-10 ENCOUNTER — Ambulatory Visit (INDEPENDENT_AMBULATORY_CARE_PROVIDER_SITE_OTHER): Payer: Medicare Other

## 2014-02-10 ENCOUNTER — Ambulatory Visit: Payer: Medicare Other | Admitting: Physical Therapy

## 2014-02-10 DIAGNOSIS — I4891 Unspecified atrial fibrillation: Secondary | ICD-10-CM

## 2014-02-10 DIAGNOSIS — Z5181 Encounter for therapeutic drug level monitoring: Secondary | ICD-10-CM

## 2014-02-10 DIAGNOSIS — Z7901 Long term (current) use of anticoagulants: Secondary | ICD-10-CM

## 2014-02-10 LAB — POCT INR: INR: 2.5

## 2014-02-11 ENCOUNTER — Ambulatory Visit: Payer: Medicare Other

## 2014-02-12 ENCOUNTER — Ambulatory Visit (INDEPENDENT_AMBULATORY_CARE_PROVIDER_SITE_OTHER): Payer: Medicare Other | Admitting: Internal Medicine

## 2014-02-12 VITALS — BP 150/70 | HR 58 | Temp 97.8°F | Resp 14 | Ht 64.5 in | Wt 138.0 lb

## 2014-02-12 DIAGNOSIS — R509 Fever, unspecified: Secondary | ICD-10-CM

## 2014-02-12 DIAGNOSIS — J209 Acute bronchitis, unspecified: Secondary | ICD-10-CM

## 2014-02-12 MED ORDER — AZITHROMYCIN 250 MG PO TABS: ORAL_TABLET | ORAL | Status: DC

## 2014-02-12 NOTE — Progress Notes (Signed)
   Subjective:    Patient ID: Anne Hayes, female    DOB: [birth year], 80 y.o.   MRN: [id number]  HPI Cough and chest congestion, low grade fever, body aches. No sob, cp. Sputum yellow copious  Review of Systems     Objective:   Physical Exam  Constitutional: She is oriented to person, place, and time. She appears well-developed and well-nourished. No distress.  HENT:  Head: Normocephalic.  Right Ear: External ear normal.  Left Ear: External ear normal.  Nose: Mucosal edema and rhinorrhea present. Right sinus exhibits no maxillary sinus tenderness and no frontal sinus tenderness. Left sinus exhibits no maxillary sinus tenderness and no frontal sinus tenderness.  Mouth/Throat: Oropharynx is clear and moist.  Eyes: EOM are normal. Pupils are equal, round, and reactive to light.  Neck: Normal range of motion.  Pulmonary/Chest: Not tachypneic. No respiratory distress. She has no decreased breath sounds. She has no wheezes. She has rhonchi. She has rales.  Rales both bases suggestive of atelectasis  Lymphadenopathy:    She has no cervical adenopathy.  Neurological: She is alert and oriented to person, place, and time. She exhibits normal muscle tone. Coordination normal.  Skin: No rash noted.  Psychiatric: She has a normal mood and affect.          Assessment & Plan:  Zpak To rtc if no improvement 48hrs

## 2014-02-12 NOTE — Patient Instructions (Signed)
Acute Bronchitis Bronchitis is inflammation of the airways that extend from the windpipe into the lungs (bronchi). The inflammation often causes mucus to develop. This leads to a cough, which is the most common symptom of bronchitis.  In acute bronchitis, the condition usually develops suddenly and goes away over time, usually in a couple weeks. Smoking, allergies, and asthma can make bronchitis worse. Repeated episodes of bronchitis may cause further lung problems.  CAUSES Acute bronchitis is most often caused by the same virus that causes a cold. The virus can spread from person to person (contagious).  SIGNS AND SYMPTOMS   Cough.   Fever.   Coughing up mucus.   Body aches.   Chest congestion.   Chills.   Shortness of breath.   Sore throat.  DIAGNOSIS  Acute bronchitis is usually diagnosed through a physical exam. Tests, such as chest X-rays, are sometimes done to rule out other conditions.  TREATMENT  Acute bronchitis usually goes away in a couple weeks. Often times, no medical treatment is necessary. Medicines are sometimes given for relief of fever or cough. Antibiotics are usually not needed but may be prescribed in certain situations. In some cases, an inhaler may be recommended to help reduce shortness of breath and control the cough. A cool mist vaporizer may also be used to help thin bronchial secretions and make it easier to clear the chest.  HOME CARE INSTRUCTIONS  Get plenty of rest.   Drink enough fluids to keep your urine clear or pale yellow (unless you have a medical condition that requires fluid restriction). Increasing fluids may help thin your secretions and will prevent dehydration.   Only take over-the-counter or prescription medicines as directed by your health care provider.   Avoid smoking and secondhand smoke. Exposure to cigarette smoke or irritating chemicals will make bronchitis worse. If you are a smoker, consider using nicotine gum or skin  patches to help control withdrawal symptoms. Quitting smoking will help your lungs heal faster.   Reduce the chances of another bout of acute bronchitis by washing your hands frequently, avoiding people with cold symptoms, and trying not to touch your hands to your mouth, nose, or eyes.   Follow up with your health care provider as directed.  SEEK MEDICAL CARE IF: Your symptoms do not improve after 1 week of treatment.  SEEK IMMEDIATE MEDICAL CARE IF:  You develop an increased fever or chills.   You have chest pain.   You have severe shortness of breath.  You have bloody sputum.   You develop dehydration.  You develop fainting.  You develop repeated vomiting.  You develop a severe headache. MAKE SURE YOU:   Understand these instructions.  Will watch your condition.  Will get help right away if you are not doing well or get worse. Document Released: 10/24/2004 Document Revised: 05/19/2013 Document Reviewed: 03/09/2013 [employer] Patient Information 2014 [employer].  

## 2014-02-14 ENCOUNTER — Ambulatory Visit: Payer: Medicare Other | Admitting: Physical Therapy

## 2014-02-16 ENCOUNTER — Ambulatory Visit: Payer: Medicare Other | Admitting: Physical Therapy

## 2014-02-23 ENCOUNTER — Ambulatory Visit: Payer: Medicare Other | Admitting: Physical Therapy

## 2014-02-25 ENCOUNTER — Ambulatory Visit: Payer: Medicare Other | Admitting: Physical Therapy

## 2014-02-28 ENCOUNTER — Ambulatory Visit: Payer: Medicare Other | Attending: Internal Medicine | Admitting: Physical Therapy

## 2014-02-28 DIAGNOSIS — R269 Unspecified abnormalities of gait and mobility: Secondary | ICD-10-CM | POA: Insufficient documentation

## 2014-02-28 DIAGNOSIS — M6281 Muscle weakness (generalized): Secondary | ICD-10-CM | POA: Insufficient documentation

## 2014-02-28 DIAGNOSIS — IMO0001 Reserved for inherently not codable concepts without codable children: Secondary | ICD-10-CM | POA: Insufficient documentation

## 2014-03-02 ENCOUNTER — Telehealth: Payer: Self-pay | Admitting: Internal Medicine

## 2014-03-02 ENCOUNTER — Ambulatory Visit: Payer: Medicare Other | Admitting: Physical Therapy

## 2014-03-02 NOTE — Telephone Encounter (Signed)
Dr Allred aware 

## 2014-03-02 NOTE — Telephone Encounter (Signed)
Pt has a upcoming appointment on Friday. Her daughter is calling concerned that she doesn't need to be driving. She would like for Dr. Rayann Heman to tell patient she is unable to drive. Any questions please call daughter.

## 2014-03-04 ENCOUNTER — Encounter: Payer: Self-pay | Admitting: Internal Medicine

## 2014-03-04 ENCOUNTER — Encounter: Payer: Medicare Other | Admitting: Internal Medicine

## 2014-03-04 ENCOUNTER — Ambulatory Visit (INDEPENDENT_AMBULATORY_CARE_PROVIDER_SITE_OTHER): Payer: Medicare Other | Admitting: Internal Medicine

## 2014-03-04 ENCOUNTER — Ambulatory Visit (INDEPENDENT_AMBULATORY_CARE_PROVIDER_SITE_OTHER): Payer: Medicare Other | Admitting: *Deleted

## 2014-03-04 VITALS — BP 158/68 | HR 57 | Ht 66.0 in | Wt 138.8 lb

## 2014-03-04 DIAGNOSIS — I4891 Unspecified atrial fibrillation: Secondary | ICD-10-CM

## 2014-03-04 DIAGNOSIS — Z7901 Long term (current) use of anticoagulants: Secondary | ICD-10-CM

## 2014-03-04 DIAGNOSIS — I495 Sick sinus syndrome: Secondary | ICD-10-CM

## 2014-03-04 DIAGNOSIS — R55 Syncope and collapse: Secondary | ICD-10-CM

## 2014-03-04 DIAGNOSIS — Z5181 Encounter for therapeutic drug level monitoring: Secondary | ICD-10-CM

## 2014-03-04 LAB — POCT INR: INR: 2.2

## 2014-03-04 MED ORDER — AMIODARONE HCL 200 MG PO TABS: 100.0000 mg | ORAL_TABLET | Freq: Every day | ORAL | Status: DC

## 2014-03-04 NOTE — Patient Instructions (Addendum)
Your physician recommends that you schedule a follow-up appointment in 3 months with Dr Rayann Heman   Your physician has recommended you make the following change in your medication:  1) Decrease Amiodarone to 100mg  daily 2) Stop Aspirin

## 2014-03-06 NOTE — Progress Notes (Signed)
PCP: Mathews Argyle, MD Primary Cardiologist: Previously Dr Drake Leach is a 80 y.o. female who presents today for routine electrophysiology followup.  Since leaving the hospital, the patient reports doing very well.  Today, she denies symptoms of palpitations, chest pain, shortness of breath,  lower extremity edema, dizziness, presyncope, or syncope.  The patient is otherwise without complaint today.   Past Medical History  Diagnosis Date  . Esophageal stricture   . Hypertension   . Persistent atrial fibrillation   . Cerebrovascular disease, unspecified   . Hyperlipidemia   . Unspecified disorder of kidney and ureter   . Renal cancer   . Generalized anxiety disorder   . Hiatal hernia   . PN (peripheral neuropathy)     in feet  . Rectal fissure   . Gastric ulcer   . IBS (irritable bowel syndrome)   . Insomnia   . Adenomatous colon polyp 2013  . Diverticulosis   . Skin cancer of nose   . Hx of cardiovascular stress test     Lexiscan Myoview (07/2013): No ischemia, EF 69%, normal study  . PONV (postoperative nausea and vomiting)   . Heart murmur   . Chronic bronchitis     "most q yr"   . GERD (gastroesophageal reflux disease)     "not bothered w/it lately" (01/11/2014)  . DJD (degenerative joint disease)   . Osteoarthrosis, unspecified whether generalized or localized, unspecified site   . Chronic lower back pain   . Ankle fracture, right     treated conservatively, non-displaced    Past Surgical History  Procedure Laterality Date  . Appendectomy    . Cholecystectomy    . Bunionectomies  Bilateral   . Anterior and posterior vaginal repair  1989    AP REPAIR & RAZ URETHROPEXY  . Renal cryoablation Right 07/2009    s/p percutaneous cryoablation of right renal cell cancer; by [name]  . Ablation  11/07/10    PVI by Dr Rayann Heman  . Tonsillectomy    . Abdominal hysterectomy  1977    "partial"  . Cataract extraction w/ intraocular lens  implant, bilateral  Bilateral   . Skin cancer excision      "nose; took skin from face and grafted area"    Current Outpatient Prescriptions  Medication Sig Dispense Refill  . acetaminophen (TYLENOL) 500 MG tablet Take 1,000 mg by mouth every 6 (six) hours as needed for mild pain or headache.      [name] amiodarone (PACERONE) 200 MG tablet Take 0.5 tablets (100 mg total) by mouth daily.      . budesonide (PULMICORT) 0.25 MG/2ML nebulizer solution Take 2 mLs (0.25 mg total) by nebulization 2 (two) times daily.  60 mL  12  . calcium-vitamin D (OSCAL WITH D) 500-200 MG-UNIT per tablet Take 1 tablet by mouth daily.      . Coenzyme Q10 (CO Q-10) 100 MG CAPS Take 100 mg by mouth daily.      [name] diltiazem (CARDIZEM CD) 120 MG 24 hr capsule Take 1 capsule (120 mg total) by mouth daily.  30 capsule  0  . docusate sodium 100 MG CAPS Take 100 mg by mouth 2 (two) times daily.  10 capsule  0  . donepezil (ARICEPT) 10 MG tablet Take 10 mg by mouth at bedtime.      [name] ezetimibe (ZETIA) 10 MG tablet Take 10 mg by mouth daily.      . folic acid (FOLVITE) 1 MG tablet Take  1 mg by mouth daily.      [name] gabapentin (NEURONTIN) 100 MG capsule Take 300 mg by mouth 2 (two) times daily.       [name] lactose free nutrition (BOOST PLUS) LIQD Take 237 mLs by mouth 2 (two) times daily between meals.  30 Can  0  . Multiple Vitamin (MULTIVITAMIN WITH MINERALS) TABS tablet Take 1 tablet by mouth daily.      [name] omega-3 acid ethyl esters (LOVAZA) 1 G capsule Take 1 g by mouth daily.      Vladimir Faster Glycol-Propyl Glycol (SYSTANE ULTRA OP) Place 1 drop into both eyes daily as needed (dry eyes).      . pravastatin (PRAVACHOL) 80 MG tablet Take 80 mg by mouth every evening.       . pyridOXINE (VITAMIN B-6) 100 MG tablet Take 100 mg by mouth daily.      . ranitidine (ZANTAC) 300 MG capsule Take 300 mg by mouth every evening.      . traZODone (DESYREL) 50 MG tablet Take 1 tablet by mouth at bedtime.      [name] warfarin (COUMADIN) 5 MG tablet Take 5 mg by mouth daily.        [name] zolpidem (AMBIEN) 10 MG tablet Take 10 mg by mouth at bedtime. For sleep       No current facility-administered medications for this visit.   ROS- all systems reviewed and negative except as per HPI above  Physical Exam: Filed Vitals:   03/04/14 1504  BP: 158/68  Pulse: 57  Height: 5\' 6"  (1.676 m)  Weight: 138 lb 12.8 oz (62.959 kg)    GEN- The patient is well appearing, alert and oriented x 3 today.   Head- normocephalic, atraumatic Eyes-  Sclera clear, conjunctiva pink Ears- hearing intact Oropharynx- clear Lungs- Clear to ausculation bilaterally, normal work of breathing Heart- Regular rate and rhythm, no murmurs, rubs or gallops, PMI not laterally displaced GI- soft, NT, ND, + BS Extremities- no clubbing, cyanosis, or edema  ekg today reveals sinus rhythm 58 bpm, PR 198  Event monitor is reviewed and reveals predominantly sinus rhythm with sinus bradycardia, rare afib, no pauses or other arrhythmias.  Assessment and Plan:  1. afib Controlled Stable No change required today  Amiodarone 100mg  daily Stop ASA  2. HTN Stable No change required today  3. ? Syncope She doesn't think that she actually passed out, though remain concerned that she did No driving x 6 months from time of her recent hospitalization

## 2014-03-07 ENCOUNTER — Telehealth: Payer: Self-pay | Admitting: *Deleted

## 2014-03-07 NOTE — Telephone Encounter (Signed)
Dr. Rayann Heman reviewed monitor. Findings: sinus rhythm, frequent sinus bradycardia, occasional A Fib, no pauses.

## 2014-03-14 ENCOUNTER — Ambulatory Visit: Payer: Medicare Other | Admitting: Physical Therapy

## 2014-03-21 ENCOUNTER — Other Ambulatory Visit: Payer: Self-pay | Admitting: Internal Medicine

## 2014-03-21 ENCOUNTER — Ambulatory Visit
Admission: RE | Admit: 2014-03-21 | Discharge: 2014-03-21 | Disposition: A | Discharge status: Home / Self Care | Payer: Medicare Other | Source: Ambulatory Visit | Attending: Internal Medicine | Admitting: Internal Medicine

## 2014-03-21 DIAGNOSIS — M25551 Pain in right hip: Secondary | ICD-10-CM

## 2014-03-28 ENCOUNTER — Ambulatory Visit: Payer: Medicare Other | Admitting: Physical Therapy

## 2014-03-30 ENCOUNTER — Ambulatory Visit: Payer: Medicare Other | Attending: Internal Medicine | Admitting: Physical Therapy

## 2014-03-30 DIAGNOSIS — M6281 Muscle weakness (generalized): Secondary | ICD-10-CM | POA: Insufficient documentation

## 2014-03-30 DIAGNOSIS — R269 Unspecified abnormalities of gait and mobility: Secondary | ICD-10-CM | POA: Insufficient documentation

## 2014-03-30 DIAGNOSIS — IMO0001 Reserved for inherently not codable concepts without codable children: Secondary | ICD-10-CM | POA: Insufficient documentation

## 2014-04-04 ENCOUNTER — Other Ambulatory Visit: Payer: Self-pay | Admitting: Geriatric Medicine

## 2014-04-04 ENCOUNTER — Ambulatory Visit: Payer: Medicare Other | Admitting: Physical Therapy

## 2014-04-04 DIAGNOSIS — M6281 Muscle weakness (generalized): Secondary | ICD-10-CM | POA: Diagnosis not present

## 2014-04-04 DIAGNOSIS — IMO0001 Reserved for inherently not codable concepts without codable children: Secondary | ICD-10-CM | POA: Diagnosis present

## 2014-04-04 DIAGNOSIS — R269 Unspecified abnormalities of gait and mobility: Secondary | ICD-10-CM | POA: Diagnosis not present

## 2014-04-04 DIAGNOSIS — R911 Solitary pulmonary nodule: Secondary | ICD-10-CM

## 2014-04-06 ENCOUNTER — Ambulatory Visit: Payer: Medicare Other | Admitting: Physical Therapy

## 2014-04-06 ENCOUNTER — Ambulatory Visit (INDEPENDENT_AMBULATORY_CARE_PROVIDER_SITE_OTHER): Payer: Medicare Other | Admitting: *Deleted

## 2014-04-06 DIAGNOSIS — IMO0001 Reserved for inherently not codable concepts without codable children: Secondary | ICD-10-CM | POA: Diagnosis not present

## 2014-04-06 DIAGNOSIS — Z5181 Encounter for therapeutic drug level monitoring: Secondary | ICD-10-CM

## 2014-04-06 DIAGNOSIS — Z7901 Long term (current) use of anticoagulants: Secondary | ICD-10-CM

## 2014-04-06 DIAGNOSIS — I4891 Unspecified atrial fibrillation: Secondary | ICD-10-CM | POA: Diagnosis not present

## 2014-04-06 LAB — POCT INR: INR: 2.7

## 2014-04-08 ENCOUNTER — Encounter (HOSPITAL_COMMUNITY): Payer: Self-pay | Admitting: Emergency Medicine

## 2014-04-08 ENCOUNTER — Emergency Department (HOSPITAL_COMMUNITY)
Admission: EM | Admit: 2014-04-08 | Discharge: 2014-04-09 | Disposition: A | Discharge status: Home / Self Care | Payer: Medicare Other | Attending: Emergency Medicine | Admitting: Emergency Medicine

## 2014-04-08 DIAGNOSIS — Z7901 Long term (current) use of anticoagulants: Secondary | ICD-10-CM | POA: Insufficient documentation

## 2014-04-08 DIAGNOSIS — G8929 Other chronic pain: Secondary | ICD-10-CM | POA: Insufficient documentation

## 2014-04-08 DIAGNOSIS — Z8781 Personal history of (healed) traumatic fracture: Secondary | ICD-10-CM | POA: Insufficient documentation

## 2014-04-08 DIAGNOSIS — E785 Hyperlipidemia, unspecified: Secondary | ICD-10-CM | POA: Insufficient documentation

## 2014-04-08 DIAGNOSIS — Z8719 Personal history of other diseases of the digestive system: Secondary | ICD-10-CM | POA: Insufficient documentation

## 2014-04-08 DIAGNOSIS — Z880 Allergy status to penicillin: Secondary | ICD-10-CM | POA: Insufficient documentation

## 2014-04-08 DIAGNOSIS — Z79899 Other long term (current) drug therapy: Secondary | ICD-10-CM | POA: Insufficient documentation

## 2014-04-08 DIAGNOSIS — Z85828 Personal history of other malignant neoplasm of skin: Secondary | ICD-10-CM | POA: Insufficient documentation

## 2014-04-08 DIAGNOSIS — I679 Cerebrovascular disease, unspecified: Secondary | ICD-10-CM | POA: Insufficient documentation

## 2014-04-08 DIAGNOSIS — G4700 Insomnia, unspecified: Secondary | ICD-10-CM | POA: Insufficient documentation

## 2014-04-08 DIAGNOSIS — Z860100 Personal history of colon polyps, unspecified: Secondary | ICD-10-CM | POA: Insufficient documentation

## 2014-04-08 DIAGNOSIS — IMO0002 Reserved for concepts with insufficient information to code with codable children: Secondary | ICD-10-CM | POA: Insufficient documentation

## 2014-04-08 DIAGNOSIS — I10 Essential (primary) hypertension: Secondary | ICD-10-CM | POA: Insufficient documentation

## 2014-04-08 DIAGNOSIS — I4891 Unspecified atrial fibrillation: Secondary | ICD-10-CM | POA: Insufficient documentation

## 2014-04-08 DIAGNOSIS — G608 Other hereditary and idiopathic neuropathies: Secondary | ICD-10-CM | POA: Insufficient documentation

## 2014-04-08 DIAGNOSIS — R011 Cardiac murmur, unspecified: Secondary | ICD-10-CM | POA: Insufficient documentation

## 2014-04-08 DIAGNOSIS — Z87891 Personal history of nicotine dependence: Secondary | ICD-10-CM | POA: Insufficient documentation

## 2014-04-08 DIAGNOSIS — Z85528 Personal history of other malignant neoplasm of kidney: Secondary | ICD-10-CM | POA: Insufficient documentation

## 2014-04-08 DIAGNOSIS — Z87448 Personal history of other diseases of urinary system: Secondary | ICD-10-CM | POA: Insufficient documentation

## 2014-04-08 DIAGNOSIS — M1990 Unspecified osteoarthritis, unspecified site: Secondary | ICD-10-CM | POA: Insufficient documentation

## 2014-04-08 NOTE — ED Notes (Signed)
Pt reports not being able to sleep x 48 hours which is making her feel weak.  Pt was taking ambien and was changed to trazadone 3 days ago.  On the first day she was able to sleep however has not been able to sleep since then.  Pt is A&O x 4.  No other c/o.

## 2014-04-08 NOTE — ED Notes (Signed)
Pt is from [facility] ,  She was prescribed a new sleeping medication a few days ago and she took it as prescribed and slept the first night but then last night slept none,  She has chills, and feels sick.  No stroke like symptoms

## 2014-04-09 MED ORDER — LORAZEPAM 0.5 MG PO TABS
0.5000 mg | ORAL_TABLET | Freq: Once | ORAL | Status: AC
  Administered 2014-04-09: 0.5 mg via ORAL
  Filled 2014-04-09: qty 1

## 2014-04-09 MED ORDER — LORAZEPAM 1 MG PO TABS: 1.0000 mg | ORAL_TABLET | Freq: Four times a day (QID) | ORAL | Status: DC | PRN

## 2014-04-09 MED ORDER — LORAZEPAM 1 MG PO TABS
1.0000 mg | ORAL_TABLET | Freq: Once | ORAL | Status: AC
  Administered 2014-04-09: 1 mg via ORAL
  Filled 2014-04-09: qty 1

## 2014-04-09 NOTE — ED Provider Notes (Signed)
Medical screening examination/treatment/procedure(s) were performed by non-physician practitioner and as supervising physician I was immediately available for consultation/collaboration.    Johnna Acosta, MD 04/09/14 [phone]

## 2014-04-09 NOTE — Discharge Instructions (Signed)
Please follow up with your doctor.   Insomnia Insomnia is frequent trouble falling and/or staying asleep. Insomnia can be a long term problem or a short term problem. Both are common. Insomnia can be a short term problem when the wakefulness is related to a certain stress or worry. Long term insomnia is often related to ongoing stress during waking hours and/or poor sleeping habits. Overtime, sleep deprivation itself can make the problem worse. Every little thing feels more severe because you are overtired and your ability to cope is decreased. CAUSES   Stress, anxiety, and depression.  Poor sleeping habits.  Distractions such as TV in the bedroom.  Naps close to bedtime.  Engaging in emotionally charged conversations before bed.  Technical reading before sleep.  Alcohol and other sedatives. They may make the problem worse. They can hurt normal sleep patterns and normal dream activity.  Stimulants such as caffeine for several hours prior to bedtime.  Pain syndromes and shortness of breath can cause insomnia.  Exercise late at night.  Changing time zones may cause sleeping problems (jet lag). It is sometimes helpful to have someone observe your sleeping patterns. They should look for periods of not breathing during the night (sleep apnea). They should also look to see how long those periods last. If you live alone or observers are uncertain, you can also be observed at a sleep clinic where your sleep patterns will be professionally monitored. Sleep apnea requires a checkup and treatment. Give your caregivers your medical history. Give your caregivers observations your family has made about your sleep.  SYMPTOMS   Not feeling rested in the morning.  Anxiety and restlessness at bedtime.  Difficulty falling and staying asleep. TREATMENT   Your caregiver may prescribe treatment for an underlying medical disorders. Your caregiver can give advice or help if you are using alcohol or  other drugs for self-medication. Treatment of underlying problems will usually eliminate insomnia problems.  Medications can be prescribed for short time use. They are generally not recommended for lengthy use.  Over-the-counter sleep medicines are not recommended for lengthy use. They can be habit forming.  You can promote easier sleeping by making lifestyle changes such as:  Using relaxation techniques that help with breathing and reduce muscle tension.  Exercising earlier in the day.  Changing your diet and the time of your last meal. No night time snacks.  Establish a regular time to go to bed.  Counseling can help with stressful problems and worry.  Soothing music and white noise may be helpful if there are background noises you cannot remove.  Stop tedious detailed work at least one hour before bedtime. HOME CARE INSTRUCTIONS   Keep a diary. Inform your caregiver about your progress. This includes any medication side effects. See your caregiver regularly. Take note of:  Times when you are asleep.  Times when you are awake during the night.  The quality of your sleep.  How you feel the next day. This information will help your caregiver care for you.  Get out of bed if you are still awake after 15 minutes. Read or do some quiet activity. Keep the lights down. Wait until you feel sleepy and go back to bed.  Keep regular sleeping and waking hours. Avoid naps.  Exercise regularly.  Avoid distractions at bedtime. Distractions include watching television or engaging in any intense or detailed activity like attempting to balance the household checkbook.  Develop a bedtime ritual. Keep a familiar routine of bathing, brushing  your teeth, climbing into bed at the same time each night, listening to soothing music. Routines increase the success of falling to sleep faster.  Use relaxation techniques. This can be using breathing and muscle tension release routines. It can also  include visualizing peaceful scenes. You can also help control troubling or intruding thoughts by keeping your mind occupied with boring or repetitive thoughts like the old concept of counting sheep. You can make it more creative like imagining planting one beautiful flower after another in your backyard garden.  During your day, work to eliminate stress. When this is not possible use some of the previous suggestions to help reduce the anxiety that accompanies stressful situations. MAKE SURE YOU:   Understand these instructions.  Will watch your condition.  Will get help right away if you are not doing well or get worse. Document Released: 09/13/2000 Document Revised: 12/09/2011 Document Reviewed: 10/14/2007 [facility] Patient Information 2015 [place], [place]. This information is not intended to replace advice given to you by your health care provider. Make sure you discuss any questions you have with your health care provider.

## 2014-04-09 NOTE — ED Provider Notes (Signed)
CSN: [id number]     Arrival date & time 04/08/14  2251 History   First MD Initiated Contact with Patient 04/08/14 2355     Chief Complaint  Patient presents with  . Insomnia   HPI  History provided by the patient and daughter. Patient is an 80 year old female with history of hypertension, controlled A. fib on Coumadin, general anxiety disorder, insomnia who presents with symptoms of continued insomnia and fatigue. Patient with long history of difficulty sleeping. Has been on Ambien and trazodone in the past and reports being started on a new medication in the last few days. She was again having difficulty sleeping at night. Also has been feeling very weak and uneasy. She was having her blood pressure checked and it was very high with a systolic blood pressure 132 and so she came to the emergency room. She otherwise did not have any other symptoms although does report the start of a headache. Denies having any chest pain shortness of breath. No recent cough cold or fever. No weakness or numbness in extremities. No other aggravating or alleviating factors. No other associated symptoms.     Past Medical History  Diagnosis Date  . Esophageal stricture   . Hypertension   . Persistent atrial fibrillation   . Cerebrovascular disease, unspecified   . Hyperlipidemia   . Unspecified disorder of kidney and ureter   . Renal cancer   . Generalized anxiety disorder   . Hiatal hernia   . PN (peripheral neuropathy)     in feet  . Rectal fissure   . Gastric ulcer   . IBS (irritable bowel syndrome)   . Insomnia   . Adenomatous colon polyp 2013  . Diverticulosis   . Skin cancer of nose   . Hx of cardiovascular stress test     Lexiscan Myoview (07/2013): No ischemia, EF 69%, normal study  . PONV (postoperative nausea and vomiting)   . Heart murmur   . Chronic bronchitis     "most q yr"   . GERD (gastroesophageal reflux disease)     "not bothered w/it lately" (01/11/2014)  . DJD (degenerative  joint disease)   . Osteoarthrosis, unspecified whether generalized or localized, unspecified site   . Chronic lower back pain   . Ankle fracture, right     treated conservatively, non-displaced    Past Surgical History  Procedure Laterality Date  . Appendectomy    . Cholecystectomy    . Bunionectomies  Bilateral   . Anterior and posterior vaginal repair  1989    AP REPAIR & RAZ URETHROPEXY  . Renal cryoablation Right 07/2009    s/p percutaneous cryoablation of right renal cell cancer; by [name]  . Ablation  11/07/10    PVI by Dr Rayann Heman  . Tonsillectomy    . Abdominal hysterectomy  1977    "partial"  . Cataract extraction w/ intraocular lens  implant, bilateral Bilateral   . Skin cancer excision      "nose; took skin from face and grafted area"   Family History  Problem Relation Age of Onset  . Heart disease Father   . Colon cancer Neg Hx   . Stroke Mother    History  Substance Use Topics  . Smoking status: Former Smoker -- 1.00 packs/day for 50 years    Types: Cigarettes    Quit date: 09/30/1988  . Smokeless tobacco: Never Used  . Alcohol Use: Yes     Comment: 01/11/2014 "glass of wine with dinner occasionally; seldom  ever; <once/month"   OB History   Grav Para Term Preterm Abortions TAB SAB Ect Mult Living                 Review of Systems  Constitutional: Positive for fatigue.  All other systems reviewed and are negative.     Allergies  Other; Codeine; Penicillins; and Sulfa antibiotics  Home Medications   Prior to Admission medications   Medication Sig Start Date End Date Taking? Authorizing Provider  amiodarone (PACERONE) 200 MG tablet Take 100 mg by mouth daily.   Yes Historical Provider, MD  amLODipine (NORVASC) 5 MG tablet Take 2.5 mg by mouth daily.   Yes Historical Provider, MD  benzonatate (TESSALON) 100 MG capsule Take 100 mg by mouth 3 (three) times daily as needed for cough.   Yes Historical Provider, MD  calcium-vitamin D (OSCAL WITH D)  500-200 MG-UNIT per tablet Take 1 tablet by mouth daily.   Yes Historical Provider, MD  Coenzyme Q10 (CO Q-10) 100 MG CAPS Take 100 mg by mouth daily.   Yes Historical Provider, MD  diltiazem (TIAZAC) 300 MG 24 hr capsule Take 300 mg by mouth daily.   Yes Historical Provider, MD  donepezil (ARICEPT) 10 MG tablet Take 10 mg by mouth at bedtime.   Yes Historical Provider, MD  fluticasone (FLONASE) 50 MCG/ACT nasal spray Place 1 spray into both nostrils daily as needed for allergies or rhinitis.   Yes Historical Provider, MD  folic acid (FOLVITE) 1 MG tablet Take 1 mg by mouth daily.   Yes Historical Provider, MD  gabapentin (NEURONTIN) 100 MG capsule Take 300 mg by mouth 2 (two) times daily.    Yes Historical Provider, MD  Multiple Vitamin (MULTIVITAMIN WITH MINERALS) TABS tablet Take 1 tablet by mouth daily.   Yes Historical Provider, MD  omega-3 acid ethyl esters (LOVAZA) 1 G capsule Take 1 g by mouth daily.   Yes Historical Provider, MD  pravastatin (PRAVACHOL) 40 MG tablet Take 40 mg by mouth daily.   Yes Historical Provider, MD  pyridOXINE (VITAMIN B-6) 100 MG tablet Take 100 mg by mouth daily.   Yes Historical Provider, MD  sodium chloride (OCEAN) 0.65 % SOLN nasal spray Place 1 spray into both nostrils 4 (four) times daily as needed for congestion.   Yes Historical Provider, MD  Suvorexant (BELSOMRA) 10 MG TABS Take 1 tablet by mouth at bedtime.   Yes Historical Provider, MD  traZODone (DESYREL) 50 MG tablet Take 50 mg by mouth at bedtime as needed for sleep.   Yes Historical Provider, MD  warfarin (COUMADIN) 5 MG tablet Take 5-7.5 mg by mouth daily. Take 1 tablet all days except Monday & Thursday take 1.5 tablets   Yes Historical Provider, MD  acetaminophen (TYLENOL) 500 MG tablet Take 1,000 mg by mouth every 6 (six) hours as needed for mild pain or headache.    Historical Provider, MD  Polyethyl Glycol-Propyl Glycol (SYSTANE ULTRA OP) Place 1 drop into both eyes daily as needed (dry eyes).     Historical Provider, MD   BP 125/100  Pulse 60  Temp(Src) 99.1 F (37.3 C) (Oral)  Resp 18  SpO2 94% Physical Exam  Nursing note and vitals reviewed. Constitutional: She is oriented to person, place, and time. She appears well-developed and well-nourished. No distress.  HENT:  Head: Normocephalic.  Eyes: Conjunctivae and EOM are normal. Pupils are equal, round, and reactive to light.  Cardiovascular: An irregular rhythm present.  Pulmonary/Chest: Effort normal and breath  sounds normal. No respiratory distress.  Abdominal: Soft.  Musculoskeletal: Normal range of motion.  Neurological: She is alert and oriented to person, place, and time. She has normal strength. No sensory deficit.  Skin: Skin is warm and dry. No rash noted.  Psychiatric: She has a normal mood and affect. Her behavior is normal.    ED Course  Procedures   COORDINATION OF CARE:  Nursing notes reviewed. Vital signs reviewed. Initial pt interview and examination performed.   Filed Vitals:   04/08/14 2252  BP: 125/100  Pulse: 60  Temp: 99.1 F (37.3 C)  TempSrc: Oral  Resp: 18  SpO2: 94%    12:06 AM-patient seen and evaluated. Patient resting, appears comfortable in no acute distress.  Patient feeling better after a dose of Ativan. Her daughter reports that she has had some small catnaps. Patient does feel more relaxed. Headache also improved. Blood pressure continues to be hypertensive with systolic in the 629U and diastolic in the 76L.   patient discussed with attending physician. He agrees with plan for a small prescription of Ativan and patient may continue to followup with PCP on Monday. Patient and family also agree and wish for discharge at this time.  Treatment plan initiated: Medications  LORazepam (ATIVAN) tablet 1 mg (1 mg Oral Given 04/09/14 0019)       MDM   Final diagnoses:  Insomnia        Martie Lee, PA-C 04/09/14 0211

## 2014-04-11 ENCOUNTER — Ambulatory Visit: Admission: RE | Admit: 2014-04-11 | Payer: Medicare Other | Source: Ambulatory Visit

## 2014-04-11 ENCOUNTER — Ambulatory Visit: Payer: Medicare Other | Admitting: Physical Therapy

## 2014-04-11 DIAGNOSIS — IMO0001 Reserved for inherently not codable concepts without codable children: Secondary | ICD-10-CM | POA: Diagnosis not present

## 2014-04-13 ENCOUNTER — Ambulatory Visit: Payer: Medicare Other | Admitting: Physical Therapy

## 2014-04-13 DIAGNOSIS — IMO0001 Reserved for inherently not codable concepts without codable children: Secondary | ICD-10-CM | POA: Diagnosis not present

## 2014-04-18 ENCOUNTER — Ambulatory Visit
Admission: RE | Admit: 2014-04-18 | Discharge: 2014-04-18 | Disposition: A | Discharge status: Home / Self Care | Payer: Medicare Other | Source: Ambulatory Visit | Attending: Geriatric Medicine | Admitting: Geriatric Medicine

## 2014-04-18 ENCOUNTER — Ambulatory Visit: Payer: Medicare Other | Admitting: Physical Therapy

## 2014-04-18 DIAGNOSIS — IMO0001 Reserved for inherently not codable concepts without codable children: Secondary | ICD-10-CM | POA: Diagnosis not present

## 2014-04-18 DIAGNOSIS — R911 Solitary pulmonary nodule: Secondary | ICD-10-CM

## 2014-04-20 ENCOUNTER — Ambulatory Visit: Payer: Medicare Other | Admitting: Physical Therapy

## 2014-04-20 DIAGNOSIS — IMO0001 Reserved for inherently not codable concepts without codable children: Secondary | ICD-10-CM | POA: Diagnosis not present

## 2014-04-25 ENCOUNTER — Other Ambulatory Visit: Payer: Self-pay | Admitting: Internal Medicine

## 2014-04-25 ENCOUNTER — Ambulatory Visit: Payer: Medicare Other | Admitting: Physical Therapy

## 2014-05-25 ENCOUNTER — Ambulatory Visit (INDEPENDENT_AMBULATORY_CARE_PROVIDER_SITE_OTHER): Payer: Medicare Other | Admitting: *Deleted

## 2014-05-25 DIAGNOSIS — I4891 Unspecified atrial fibrillation: Secondary | ICD-10-CM

## 2014-05-25 DIAGNOSIS — Z5181 Encounter for therapeutic drug level monitoring: Secondary | ICD-10-CM

## 2014-05-25 DIAGNOSIS — Z7901 Long term (current) use of anticoagulants: Secondary | ICD-10-CM

## 2014-05-25 LAB — POCT INR: INR: 1.9

## 2014-06-13 ENCOUNTER — Other Ambulatory Visit: Payer: Self-pay

## 2014-06-13 ENCOUNTER — Ambulatory Visit (INDEPENDENT_AMBULATORY_CARE_PROVIDER_SITE_OTHER): Payer: Medicare Other | Admitting: Internal Medicine

## 2014-06-13 ENCOUNTER — Encounter: Payer: Self-pay | Admitting: Internal Medicine

## 2014-06-13 ENCOUNTER — Ambulatory Visit (INDEPENDENT_AMBULATORY_CARE_PROVIDER_SITE_OTHER): Payer: Medicare Other | Admitting: *Deleted

## 2014-06-13 VITALS — BP 138/64 | HR 60 | Ht 66.0 in | Wt 144.4 lb

## 2014-06-13 DIAGNOSIS — I480 Paroxysmal atrial fibrillation: Secondary | ICD-10-CM

## 2014-06-13 DIAGNOSIS — Z5181 Encounter for therapeutic drug level monitoring: Secondary | ICD-10-CM

## 2014-06-13 DIAGNOSIS — I4891 Unspecified atrial fibrillation: Secondary | ICD-10-CM

## 2014-06-13 DIAGNOSIS — I10 Essential (primary) hypertension: Secondary | ICD-10-CM

## 2014-06-13 DIAGNOSIS — Z7901 Long term (current) use of anticoagulants: Secondary | ICD-10-CM

## 2014-06-13 LAB — POCT INR: INR: 5.1

## 2014-06-13 MED ORDER — DILTIAZEM HCL ER COATED BEADS 120 MG PO CP24: 120.0000 mg | ORAL_CAPSULE | Freq: Every day | ORAL | Status: AC

## 2014-06-13 NOTE — Progress Notes (Signed)
PCP: Mathews Argyle, MD Primary Cardiologist: Previously Dr Drake Leach is a 80 y.o. female who presents today for routine electrophysiology followup.  Since her last visit, the patient reports doing very well.  Today, she denies symptoms of palpitations, chest pain, shortness of breath,  lower extremity edema, dizziness, presyncope, or syncope.  The patient is otherwise without complaint today.   Past Medical History  Diagnosis Date  . Esophageal stricture   . Hypertension   . Persistent atrial fibrillation   . Cerebrovascular disease, unspecified   . Hyperlipidemia   . Unspecified disorder of kidney and ureter   . Renal cancer   . Generalized anxiety disorder   . Hiatal hernia   . PN (peripheral neuropathy)     in feet  . Rectal fissure   . Gastric ulcer   . IBS (irritable bowel syndrome)   . Insomnia   . Adenomatous colon polyp 2013  . Diverticulosis   . Skin cancer of nose   . Hx of cardiovascular stress test     Lexiscan Myoview (07/2013): No ischemia, EF 69%, normal study  . PONV (postoperative nausea and vomiting)   . Heart murmur   . Chronic bronchitis     "most q yr"   . GERD (gastroesophageal reflux disease)     "not bothered w/it lately" (01/11/2014)  . DJD (degenerative joint disease)   . Osteoarthrosis, unspecified whether generalized or localized, unspecified site   . Chronic lower back pain   . Ankle fracture, right     treated conservatively, non-displaced    Past Surgical History  Procedure Laterality Date  . Appendectomy    . Cholecystectomy    . Bunionectomies  Bilateral   . Anterior and posterior vaginal repair  1989    AP REPAIR & RAZ URETHROPEXY  . Renal cryoablation Right 07/2009    s/p percutaneous cryoablation of right renal cell cancer; by [name]  . Ablation  11/07/10    [facility] by Dr Rayann Heman  . Tonsillectomy    . Abdominal hysterectomy  1977    "partial"  . Cataract extraction w/ intraocular lens  implant, bilateral Bilateral    . Skin cancer excision      "nose; took skin from face and grafted area"    Current Outpatient Prescriptions  Medication Sig Dispense Refill  . acetaminophen (TYLENOL) 500 MG tablet Take 1,000 mg by mouth every 6 (six) hours as needed for mild pain or headache.      [name] amiodarone (PACERONE) 200 MG tablet take 1 tablet by mouth once daily  30 tablet  6  . amLODipine (NORVASC) 5 MG tablet Take 2.5 mg by mouth daily.      . benzonatate (TESSALON) 100 MG capsule Take 100 mg by mouth 3 (three) times daily as needed for cough.      . Cholecalciferol (VITAMIN D PO) Take 1 capsule by mouth daily.      . ciprofloxacin (CIPRO) 250 MG tablet Take 1 tablet by mouth 2 (two) times daily.      [name] donepezil (ARICEPT) 10 MG tablet Take 10 mg by mouth at bedtime.      . fluticasone (FLONASE) 50 MCG/ACT nasal spray Place 1 spray into both nostrils daily as needed for allergies or rhinitis.      [name] gabapentin (NEURONTIN) 100 MG capsule Take 300 mg by mouth 2 (two) times daily.       [name] LORazepam (ATIVAN) 1 MG tablet Take 1 tablet (1 mg total) by  mouth every 6 (six) hours as needed for anxiety or sleep.  6 tablet  0  . Multiple Vitamin (MULTIVITAMIN WITH MINERALS) TABS tablet Take 1 tablet by mouth daily.      [name] omega-3 acid ethyl esters (LOVAZA) 1 G capsule Take 1 g by mouth daily.      Vladimir Faster Glycol-Propyl Glycol (SYSTANE ULTRA OP) Place 1 drop into both eyes daily as needed (dry eyes).      . sodium chloride (OCEAN) 0.65 % SOLN nasal spray Place 1 spray into both nostrils 4 (four) times daily as needed for congestion.      . traZODone (DESYREL) 50 MG tablet Take 50 mg by mouth at bedtime as needed for sleep.      [name] warfarin (COUMADIN) 5 MG tablet Take 5-7.5 mg by mouth daily. Take 1 tablet all days except Monday & Thursday take 1.5 tablets      . diltiazem (CARDIZEM CD) 180 MG 24 hr capsule Take 1 capsule by mouth daily.      [name] diltiazem (TIAZAC) 300 MG 24 hr capsule Take 300 mg by mouth daily.      . folic  acid (FOLVITE) 1 MG tablet Take 1 mg by mouth daily.       No current facility-administered medications for this visit.   ROS- all systems reviewed and negative except as per HPI above  Physical Exam: Filed Vitals:   06/13/14 1519  BP: 138/64  Pulse: 60  Height: 5\' 6"  (1.676 m)  Weight: 144 lb 6.4 oz (65.499 kg)    GEN- The patient is well appearing, alert and oriented x 3 today.  Appears slightly confused and has difficulty with recent memory recall Head- normocephalic, atraumatic Eyes-  Sclera clear, conjunctiva pink Ears- hearing intact Oropharynx- clear Lungs- Clear to ausculation bilaterally, normal work of breathing Heart- Regular rate and rhythm, no murmurs, rubs or gallops, PMI not laterally displaced GI- soft, NT, ND, + BS Extremities- no clubbing, cyanosis, or edema  ekg today reveals sinus rhythm 60 bpm     Assessment and Plan:  1. afib Controlled Stable No change required today  Amiodarone 100mg  daily Clarification, per last visit, her diltiazem dose should be cardizem CD 120mg  daily (she was not sure which dose she should be taking today)  2. HTN Stable No change required today   Return in 4 months

## 2014-06-13 NOTE — Patient Instructions (Addendum)
Your physician wants you to follow-up in 4 months with Dr. Rayann Heman. You will receive a reminder letter in the mail two months in advance. If you don't receive a letter, please call our office to schedule the follow-up appointment.  Please call our office at [phone] to tell us how much Diltiazem you have been taking.   Patient called back. Per Dr. Rayann Heman, Decrease Dilt to 120mg  daily.

## 2014-06-20 ENCOUNTER — Ambulatory Visit (INDEPENDENT_AMBULATORY_CARE_PROVIDER_SITE_OTHER): Payer: Medicare Other | Admitting: *Deleted

## 2014-06-20 DIAGNOSIS — Z5181 Encounter for therapeutic drug level monitoring: Secondary | ICD-10-CM

## 2014-06-20 DIAGNOSIS — I4891 Unspecified atrial fibrillation: Secondary | ICD-10-CM

## 2014-06-20 DIAGNOSIS — Z7901 Long term (current) use of anticoagulants: Secondary | ICD-10-CM

## 2014-06-20 LAB — POCT INR: INR: 2.4

## 2014-07-11 ENCOUNTER — Ambulatory Visit (INDEPENDENT_AMBULATORY_CARE_PROVIDER_SITE_OTHER): Payer: Medicare Other | Admitting: *Deleted

## 2014-07-11 DIAGNOSIS — Z5181 Encounter for therapeutic drug level monitoring: Secondary | ICD-10-CM

## 2014-07-11 DIAGNOSIS — I4891 Unspecified atrial fibrillation: Secondary | ICD-10-CM

## 2014-07-11 DIAGNOSIS — Z7901 Long term (current) use of anticoagulants: Secondary | ICD-10-CM

## 2014-07-11 LAB — POCT INR: INR: 3.3

## 2014-07-27 ENCOUNTER — Ambulatory Visit (INDEPENDENT_AMBULATORY_CARE_PROVIDER_SITE_OTHER): Payer: Medicare Other | Admitting: *Deleted

## 2014-07-27 DIAGNOSIS — Z5181 Encounter for therapeutic drug level monitoring: Secondary | ICD-10-CM

## 2014-07-27 DIAGNOSIS — I4891 Unspecified atrial fibrillation: Secondary | ICD-10-CM

## 2014-07-27 DIAGNOSIS — Z7901 Long term (current) use of anticoagulants: Secondary | ICD-10-CM

## 2014-07-27 LAB — POCT INR: INR: 2.8

## 2014-09-07 ENCOUNTER — Ambulatory Visit (INDEPENDENT_AMBULATORY_CARE_PROVIDER_SITE_OTHER): Payer: Medicare Other | Admitting: *Deleted

## 2014-09-07 DIAGNOSIS — Z7901 Long term (current) use of anticoagulants: Secondary | ICD-10-CM

## 2014-09-07 DIAGNOSIS — Z5181 Encounter for therapeutic drug level monitoring: Secondary | ICD-10-CM

## 2014-09-07 DIAGNOSIS — I4891 Unspecified atrial fibrillation: Secondary | ICD-10-CM

## 2014-09-07 LAB — POCT INR: INR: 2

## 2014-09-16 ENCOUNTER — Telehealth: Payer: Self-pay | Admitting: *Deleted

## 2014-09-16 ENCOUNTER — Other Ambulatory Visit: Payer: Self-pay | Admitting: Internal Medicine

## 2014-09-16 ENCOUNTER — Other Ambulatory Visit: Payer: Self-pay

## 2014-09-16 MED ORDER — AMIODARONE HCL 200 MG PO TABS: 200.0000 mg | ORAL_TABLET | Freq: Every day | ORAL | Status: DC

## 2014-09-16 NOTE — Telephone Encounter (Signed)
Amiodarone 100mg  daily

## 2014-09-16 NOTE — Telephone Encounter (Signed)
Should this patient be on amiodarone 100mg  or 200mg  qd? The patients last office visit list has 200mg , but further down on the note it has 100mg . Please advise. Thanks, MI

## 2014-09-19 ENCOUNTER — Other Ambulatory Visit: Payer: Self-pay | Admitting: *Deleted

## 2014-09-19 MED ORDER — AMIODARONE HCL 200 MG PO TABS: 100.0000 mg | ORAL_TABLET | Freq: Every day | ORAL | Status: DC

## 2014-10-05 ENCOUNTER — Ambulatory Visit (INDEPENDENT_AMBULATORY_CARE_PROVIDER_SITE_OTHER): Payer: Medicare Other | Admitting: *Deleted

## 2014-10-05 DIAGNOSIS — Z7901 Long term (current) use of anticoagulants: Secondary | ICD-10-CM | POA: Diagnosis not present

## 2014-10-05 DIAGNOSIS — Z5181 Encounter for therapeutic drug level monitoring: Secondary | ICD-10-CM

## 2014-10-05 DIAGNOSIS — I4891 Unspecified atrial fibrillation: Secondary | ICD-10-CM

## 2014-10-05 LAB — POCT INR: INR: 1.8

## 2014-10-26 ENCOUNTER — Ambulatory Visit (INDEPENDENT_AMBULATORY_CARE_PROVIDER_SITE_OTHER): Payer: Medicare Other | Admitting: *Deleted

## 2014-10-26 DIAGNOSIS — I4891 Unspecified atrial fibrillation: Secondary | ICD-10-CM

## 2014-10-26 DIAGNOSIS — Z5181 Encounter for therapeutic drug level monitoring: Secondary | ICD-10-CM

## 2014-10-26 DIAGNOSIS — Z7901 Long term (current) use of anticoagulants: Secondary | ICD-10-CM

## 2014-10-26 LAB — POCT INR: INR: 2

## 2014-11-01 ENCOUNTER — Other Ambulatory Visit: Payer: Self-pay | Admitting: Internal Medicine

## 2014-11-03 ENCOUNTER — Telehealth: Payer: Self-pay | Admitting: Internal Medicine

## 2014-11-03 NOTE — Telephone Encounter (Signed)
New Message       Pt calling stating that she needs to speak to [place] and will not state what it is in regards to. Please call back.

## 2014-11-03 NOTE — Telephone Encounter (Signed)
Was worried about her Amiodarone  It has been filled and will ship to her today

## 2014-11-23 ENCOUNTER — Ambulatory Visit (INDEPENDENT_AMBULATORY_CARE_PROVIDER_SITE_OTHER): Payer: Medicare Other | Admitting: *Deleted

## 2014-11-23 DIAGNOSIS — I4891 Unspecified atrial fibrillation: Secondary | ICD-10-CM

## 2014-11-23 DIAGNOSIS — Z5181 Encounter for therapeutic drug level monitoring: Secondary | ICD-10-CM

## 2014-11-23 DIAGNOSIS — Z7901 Long term (current) use of anticoagulants: Secondary | ICD-10-CM

## 2014-11-23 LAB — POCT INR: INR: 2.3

## 2014-12-21 ENCOUNTER — Ambulatory Visit (INDEPENDENT_AMBULATORY_CARE_PROVIDER_SITE_OTHER): Payer: Medicare Other | Admitting: *Deleted

## 2014-12-21 DIAGNOSIS — I4891 Unspecified atrial fibrillation: Secondary | ICD-10-CM

## 2014-12-21 DIAGNOSIS — Z7901 Long term (current) use of anticoagulants: Secondary | ICD-10-CM | POA: Diagnosis not present

## 2014-12-21 DIAGNOSIS — Z5181 Encounter for therapeutic drug level monitoring: Secondary | ICD-10-CM | POA: Diagnosis not present

## 2014-12-21 LAB — POCT INR: INR: 2.2

## 2015-01-20 ENCOUNTER — Ambulatory Visit (INDEPENDENT_AMBULATORY_CARE_PROVIDER_SITE_OTHER): Payer: Medicare Other | Admitting: *Deleted

## 2015-01-20 DIAGNOSIS — Z7901 Long term (current) use of anticoagulants: Secondary | ICD-10-CM | POA: Diagnosis not present

## 2015-01-20 DIAGNOSIS — Z5181 Encounter for therapeutic drug level monitoring: Secondary | ICD-10-CM | POA: Diagnosis not present

## 2015-01-20 DIAGNOSIS — I4891 Unspecified atrial fibrillation: Secondary | ICD-10-CM

## 2015-01-20 LAB — POCT INR: INR: 4.7

## 2015-01-27 ENCOUNTER — Encounter (HOSPITAL_COMMUNITY): Payer: Self-pay

## 2015-01-27 ENCOUNTER — Observation Stay (HOSPITAL_COMMUNITY)
Admission: EM | Admit: 2015-01-27 | Discharge: 2015-01-30 | Disposition: A | Discharge status: Skilled Nursing Facility | Payer: Medicare Other | Attending: Internal Medicine | Admitting: Internal Medicine

## 2015-01-27 ENCOUNTER — Ambulatory Visit (INDEPENDENT_AMBULATORY_CARE_PROVIDER_SITE_OTHER): Payer: Medicare Other | Admitting: *Deleted

## 2015-01-27 ENCOUNTER — Emergency Department (HOSPITAL_COMMUNITY): Payer: Medicare Other

## 2015-01-27 DIAGNOSIS — I4891 Unspecified atrial fibrillation: Secondary | ICD-10-CM | POA: Diagnosis not present

## 2015-01-27 DIAGNOSIS — I10 Essential (primary) hypertension: Secondary | ICD-10-CM | POA: Diagnosis present

## 2015-01-27 DIAGNOSIS — K219 Gastro-esophageal reflux disease without esophagitis: Secondary | ICD-10-CM | POA: Diagnosis not present

## 2015-01-27 DIAGNOSIS — F0390 Unspecified dementia without behavioral disturbance: Secondary | ICD-10-CM | POA: Diagnosis not present

## 2015-01-27 DIAGNOSIS — E039 Hypothyroidism, unspecified: Secondary | ICD-10-CM | POA: Diagnosis not present

## 2015-01-27 DIAGNOSIS — M1990 Unspecified osteoarthritis, unspecified site: Secondary | ICD-10-CM | POA: Diagnosis not present

## 2015-01-27 DIAGNOSIS — G629 Polyneuropathy, unspecified: Secondary | ICD-10-CM | POA: Diagnosis not present

## 2015-01-27 DIAGNOSIS — Z7901 Long term (current) use of anticoagulants: Secondary | ICD-10-CM | POA: Insufficient documentation

## 2015-01-27 DIAGNOSIS — K5790 Diverticulosis of intestine, part unspecified, without perforation or abscess without bleeding: Secondary | ICD-10-CM | POA: Insufficient documentation

## 2015-01-27 DIAGNOSIS — Z87891 Personal history of nicotine dependence: Secondary | ICD-10-CM | POA: Insufficient documentation

## 2015-01-27 DIAGNOSIS — K449 Diaphragmatic hernia without obstruction or gangrene: Secondary | ICD-10-CM | POA: Insufficient documentation

## 2015-01-27 DIAGNOSIS — E785 Hyperlipidemia, unspecified: Secondary | ICD-10-CM | POA: Diagnosis not present

## 2015-01-27 DIAGNOSIS — Z5181 Encounter for therapeutic drug level monitoring: Secondary | ICD-10-CM | POA: Diagnosis not present

## 2015-01-27 DIAGNOSIS — G9340 Encephalopathy, unspecified: Secondary | ICD-10-CM | POA: Diagnosis not present

## 2015-01-27 DIAGNOSIS — Z8711 Personal history of peptic ulcer disease: Secondary | ICD-10-CM | POA: Insufficient documentation

## 2015-01-27 DIAGNOSIS — Z79899 Other long term (current) drug therapy: Secondary | ICD-10-CM | POA: Diagnosis not present

## 2015-01-27 DIAGNOSIS — I482 Chronic atrial fibrillation, unspecified: Secondary | ICD-10-CM | POA: Diagnosis present

## 2015-01-27 DIAGNOSIS — R0609 Other forms of dyspnea: Secondary | ICD-10-CM | POA: Diagnosis not present

## 2015-01-27 DIAGNOSIS — M545 Low back pain: Secondary | ICD-10-CM | POA: Insufficient documentation

## 2015-01-27 DIAGNOSIS — G8929 Other chronic pain: Secondary | ICD-10-CM | POA: Insufficient documentation

## 2015-01-27 DIAGNOSIS — R300 Dysuria: Secondary | ICD-10-CM | POA: Insufficient documentation

## 2015-01-27 DIAGNOSIS — F411 Generalized anxiety disorder: Secondary | ICD-10-CM | POA: Insufficient documentation

## 2015-01-27 DIAGNOSIS — Z7951 Long term (current) use of inhaled steroids: Secondary | ICD-10-CM | POA: Diagnosis not present

## 2015-01-27 DIAGNOSIS — R531 Weakness: Secondary | ICD-10-CM | POA: Diagnosis present

## 2015-01-27 DIAGNOSIS — I4892 Unspecified atrial flutter: Secondary | ICD-10-CM | POA: Insufficient documentation

## 2015-01-27 LAB — PROTIME-INR
INR: 1.6 — AB (ref 0.00–1.49)
PROTHROMBIN TIME: 19.2 s — AB (ref 11.6–15.2)

## 2015-01-27 LAB — COMPREHENSIVE METABOLIC PANEL
ALT: 20 U/L (ref 0–35)
AST: 26 U/L (ref 0–37)
Albumin: 3.8 g/dL (ref 3.5–5.2)
Alkaline Phosphatase: 46 U/L (ref 39–117)
Anion gap: 6 (ref 5–15)
BUN: 19 mg/dL (ref 6–23)
CALCIUM: 9.3 mg/dL (ref 8.4–10.5)
CHLORIDE: 108 mmol/L (ref 96–112)
CO2: 26 mmol/L (ref 19–32)
Creatinine, Ser: 1.04 mg/dL (ref 0.50–1.10)
GFR calc Af Amer: 54 mL/min — ABNORMAL LOW (ref >90)
GFR, EST NON AFRICAN AMERICAN: 47 mL/min — AB (ref >90)
GLUCOSE: 112 mg/dL — AB (ref 70–99)
Potassium: 4.4 mmol/L (ref 3.5–5.1)
Sodium: 140 mmol/L (ref 135–145)
Total Bilirubin: 0.5 mg/dL (ref 0.3–1.2)
Total Protein: 6.8 g/dL (ref 6.0–8.3)

## 2015-01-27 LAB — CBC
HEMATOCRIT: 40.5 % (ref 36.0–46.0)
Hemoglobin: 12.9 g/dL (ref 12.0–15.0)
MCH: 30.9 pg (ref 26.0–34.0)
MCHC: 31.9 g/dL (ref 30.0–36.0)
MCV: 96.9 fL (ref 78.0–100.0)
PLATELETS: 147 10*3/uL — AB (ref 150–400)
RBC: 4.18 MIL/uL (ref 3.87–5.11)
RDW: 14 % (ref 11.5–15.5)
WBC: 6.2 10*3/uL (ref 4.0–10.5)

## 2015-01-27 LAB — URINALYSIS, ROUTINE W REFLEX MICROSCOPIC
Bilirubin Urine: NEGATIVE
GLUCOSE, UA: NEGATIVE mg/dL
Hgb urine dipstick: NEGATIVE
Ketones, ur: NEGATIVE mg/dL
Nitrite: NEGATIVE
Protein, ur: NEGATIVE mg/dL
Specific Gravity, Urine: 1.022 (ref 1.005–1.030)
Urobilinogen, UA: 0.2 mg/dL (ref 0.0–1.0)
pH: 6 (ref 5.0–8.0)

## 2015-01-27 LAB — URINE MICROSCOPIC-ADD ON

## 2015-01-27 LAB — POCT INR: INR: 1.7

## 2015-01-27 LAB — CBG MONITORING, ED: GLUCOSE-CAPILLARY: 99 mg/dL (ref 70–99)

## 2015-01-27 LAB — POC OCCULT BLOOD, ED: Fecal Occult Bld: NEGATIVE

## 2015-01-27 MED ORDER — SODIUM CHLORIDE 0.9 % IV BOLUS (SEPSIS)
500.0000 mL | Freq: Once | INTRAVENOUS | Status: AC
Start: 2015-01-27 — End: 2015-01-28
  Administered 2015-01-27: 500 mL via INTRAVENOUS

## 2015-01-27 NOTE — ED Notes (Signed)
Bed: [facility] Expected date:  Expected time:  Means of arrival:  Comments: EMS 

## 2015-01-27 NOTE — ED Provider Notes (Signed)
CSN: [id number]     Arrival date & time 01/27/15  2005 History   First MD Initiated Contact with Patient 01/27/15 2045     Chief Complaint  Patient presents with  . Weakness     (Consider location/radiation/quality/duration/timing/severity/associated sxs/prior Treatment) The history is provided by the patient and medical records. No language interpreter was used.     Anne Hayes is a 81 y.o. female  with a hx of HTN, gastric ulcer, IBS, DJD, chronic low back pain, a-fib (with ablation - Dr. Rayann Heman taking coumadin and cardizem)  presents to the Emergency Department complaining of gradual, persistent, progressively worsening generalized weakness onset 3 days ago.  Pt reports she becomes SOB with ambulation to the bathroom.  Pt's daughter reports that she has been sleeping a lot.  Associated symptoms include intermittent dysuria and foul smelling urine.  Nothing makes it better and nothing makes it worse.  Pt denies fever, chills, headache, neck pain, chest pain, abd pain, N/V/D, syncope.     Past Medical History  Diagnosis Date  . Esophageal stricture   . Hypertension   . Persistent atrial fibrillation   . Cerebrovascular disease, unspecified   . Hyperlipidemia   . Unspecified disorder of kidney and ureter   . Renal cancer   . Generalized anxiety disorder   . Hiatal hernia   . PN (peripheral neuropathy)     in feet  . Rectal fissure   . Gastric ulcer   . IBS (irritable bowel syndrome)   . Insomnia   . Adenomatous colon polyp 2013  . Diverticulosis   . Skin cancer of nose   . Hx of cardiovascular stress test     Lexiscan Myoview (07/2013): No ischemia, EF 69%, normal study  . PONV (postoperative nausea and vomiting)   . Heart murmur   . Chronic bronchitis     "most q yr"   . GERD (gastroesophageal reflux disease)     "not bothered w/it lately" (01/11/2014)  . DJD (degenerative joint disease)   . Osteoarthrosis, unspecified whether generalized or localized, unspecified site    . Chronic lower back pain   . Ankle fracture, right     treated conservatively, non-displaced    Past Surgical History  Procedure Laterality Date  . Appendectomy    . Cholecystectomy    . Bunionectomies  Bilateral   . Anterior and posterior vaginal repair  1989    AP REPAIR & RAZ URETHROPEXY  . Renal cryoablation Right 07/2009    s/p percutaneous cryoablation of right renal cell cancer; by [name]  . Ablation  11/07/10    [facility]I by Dr Rayann Heman  . Tonsillectomy    . Abdominal hysterectomy  1977    "partial"  . Cataract extraction w/ intraocular lens  implant, bilateral Bilateral   . Skin cancer excision      "nose; took skin from face and grafted area"   Family History  Problem Relation Age of Onset  . Heart disease Father   . Colon cancer Neg Hx   . Stroke Mother    History  Substance Use Topics  . Smoking status: Former Smoker -- 1.00 packs/day for 50 years    Types: Cigarettes    Quit date: 09/30/1988  . Smokeless tobacco: Never Used  . Alcohol Use: Yes     Comment: 01/11/2014 "glass of wine with dinner occasionally; seldom ever; <once/month"   OB History    No data available     Review of Systems  Constitutional: Negative  for fever, diaphoresis, appetite change, fatigue and unexpected weight change.  HENT: Negative for mouth sores.   Eyes: Negative for visual disturbance.  Respiratory: Positive for shortness of breath (on exertion). Negative for cough, chest tightness and wheezing.   Cardiovascular: Negative for chest pain.  Gastrointestinal: Negative for nausea, vomiting, abdominal pain, diarrhea and constipation.  Endocrine: Negative for polydipsia, polyphagia and polyuria.  Genitourinary: Negative for dysuria, urgency, frequency and hematuria.  Musculoskeletal: Negative for back pain and neck stiffness.  Skin: Negative for rash.  Allergic/Immunologic: Negative for immunocompromised state.  Neurological: Negative for syncope, light-headedness and headaches.   Hematological: Does not bruise/bleed easily.  Psychiatric/Behavioral: Negative for sleep disturbance. The patient is not nervous/anxious.       Allergies  Other; Codeine; Penicillins; and Sulfa antibiotics  Home Medications   Prior to Admission medications   Medication Sig Start Date End Date Taking? Authorizing Provider  acetaminophen (TYLENOL) 500 MG tablet Take 1,000 mg by mouth every 6 (six) hours as needed for mild pain or headache.   Yes Historical Provider, MD  amiodarone (PACERONE) 200 MG tablet TAKE 1/2 TABLET DAILY. 11/03/14  Yes Thompson Grayer, MD  amLODipine (NORVASC) 5 MG tablet Take 2.5 mg by mouth daily.   Yes Historical Provider, MD  diltiazem (CARDIZEM CD) 120 MG 24 hr capsule Take 1 capsule (120 mg total) by mouth daily. 06/13/14  Yes Thompson Grayer, MD  donepezil (ARICEPT) 10 MG tablet Take 10 mg by mouth at bedtime.   Yes Historical Provider, MD  fluticasone (FLONASE) 50 MCG/ACT nasal spray Place 1 spray into both nostrils daily as needed for allergies or rhinitis.   Yes Historical Provider, MD  gabapentin (NEURONTIN) 100 MG capsule Take 300 mg by mouth 2 (two) times daily.    Yes Historical Provider, MD  LORazepam (ATIVAN) 1 MG tablet Take 1 tablet (1 mg total) by mouth every 6 (six) hours as needed for anxiety or sleep. 04/09/14  Yes Peter Dammen, PA-C  omega-3 acid ethyl esters (LOVAZA) 1 G capsule Take 1 g by mouth daily.   Yes Historical Provider, MD  Polyethyl Glycol-Propyl Glycol (SYSTANE ULTRA OP) Place 1 drop into both eyes daily as needed (dry eyes).   Yes Historical Provider, MD  traZODone (DESYREL) 50 MG tablet Take 50 mg by mouth at bedtime as needed for sleep.   Yes Historical Provider, MD  warfarin (COUMADIN) 5 MG tablet Take 5-7.5 mg by mouth daily. Take 1 tablet all days except Thursday take 1.5 tablets   Yes Historical Provider, MD  benzonatate (TESSALON) 100 MG capsule Take 100 mg by mouth 3 (three) times daily as needed for cough.    Historical Provider, MD   Cholecalciferol (VITAMIN D PO) Take 1 capsule by mouth daily.    Historical Provider, MD  ciprofloxacin (CIPRO) 250 MG tablet Take 1 tablet by mouth 2 (two) times daily. 06/10/14   Historical Provider, MD  folic acid (FOLVITE) 1 MG tablet Take 1 mg by mouth daily.    Historical Provider, MD  Multiple Vitamin (MULTIVITAMIN WITH MINERALS) TABS tablet Take 1 tablet by mouth daily.    Historical Provider, MD  sodium chloride (OCEAN) 0.65 % SOLN nasal spray Place 1 spray into both nostrils 4 (four) times daily as needed for congestion.    Historical Provider, MD   BP 132/77 mmHg  Pulse 81  Temp(Src) 98.2 F (36.8 C) (Rectal)  Resp 18  SpO2 96% Physical Exam  Constitutional: She is oriented to person, place, and time. She appears  well-developed and well-nourished. No distress.  Awake, alert, nontoxic appearance  HENT:  Head: Normocephalic and atraumatic.  Right Ear: Tympanic membrane, external ear and ear canal normal.  Left Ear: Tympanic membrane, external ear and ear canal normal.  Nose: Nose normal. No epistaxis. Right sinus exhibits no maxillary sinus tenderness and no frontal sinus tenderness. Left sinus exhibits no maxillary sinus tenderness and no frontal sinus tenderness.  Mouth/Throat: Uvula is midline, oropharynx is clear and moist and mucous membranes are normal. Mucous membranes are not pale and not cyanotic. No oropharyngeal exudate, posterior oropharyngeal edema, posterior oropharyngeal erythema or tonsillar abscesses.  Eyes: Conjunctivae are normal. Pupils are equal, round, and reactive to light. No scleral icterus.  Neck: Normal range of motion and full passive range of motion without pain. Neck supple.  Cardiovascular: Normal rate, normal heart sounds and intact distal pulses.  An irregularly irregular rhythm present.  No murmur heard. Pulses:      Radial pulses are 2+ on the right side, and 2+ on the left side.       Dorsalis pedis pulses are 2+ on the right side, and 2+ on  the left side.  Pulmonary/Chest: Effort normal and breath sounds normal. No stridor. No respiratory distress. She has no wheezes.  Equal chest expansion Clear and equal breath sounds  Abdominal: Soft. Bowel sounds are normal. She exhibits no distension and no mass. There is no tenderness. There is no rebound and no guarding.  abd soft and nontender No CVA tenderness  Musculoskeletal: Normal range of motion. She exhibits no edema.  Lymphadenopathy:    She has no cervical adenopathy.  Neurological: She is alert and oriented to person, place, and time.  Speech is clear and goal oriented Moves extremities without ataxia  Skin: Skin is warm and dry. No rash noted. She is not diaphoretic. No erythema.  Psychiatric: She has a normal mood and affect.  Nursing note and vitals reviewed.   ED Course  Procedures (including critical care time) Labs Review Labs Reviewed  CBC - Abnormal; Notable for the following:    Platelets 147 (*)    All other components within normal limits  URINALYSIS, ROUTINE W REFLEX MICROSCOPIC - Abnormal; Notable for the following:    Leukocytes, UA SMALL (*)    All other components within normal limits  COMPREHENSIVE METABOLIC PANEL - Abnormal; Notable for the following:    Glucose, Bld 112 (*)    GFR calc non Af Amer 47 (*)    GFR calc Af Amer 54 (*)    All other components within normal limits  URINE MICROSCOPIC-ADD ON - Abnormal; Notable for the following:    Squamous Epithelial / LPF FEW (*)    All other components within normal limits  PROTIME-INR - Abnormal; Notable for the following:    Prothrombin Time 19.2 (*)    INR 1.60 (*)    All other components within normal limits  URINE CULTURE  TROPONIN I  D-DIMER, QUANTITATIVE  BRAIN NATRIURETIC PEPTIDE  CBG MONITORING, ED  POC OCCULT BLOOD, ED    Imaging Review Dg Chest Port 1 View  01/27/2015   CLINICAL DATA:  Weakness. Hypertension. Chronic bronchitis. Ex-smoker.  EXAM: PORTABLE CHEST - 1 VIEW   COMPARISON:  11/29/2013  FINDINGS: Midline trachea. Cardiomegaly accentuated by AP portable technique. Atherosclerosis in the transverse aorta. No pleural effusion or pneumothorax. Pulmonary interstitial thickening is moderate, and accentuated by AP portable technique. No lobar consolidation. Mild scarring at the left lung base.  IMPRESSION: COPD/chronic  bronchitis, without superimposed acute finding.  Mild cardiomegaly with aortic atherosclerosis.   Electronically Signed   By: Abigail Miyamoto M.D.   On: 01/27/2015 22:11     EKG Interpretation   Date/Time:  Friday January 27 2015 20:53:47 EDT Ventricular Rate:  92 PR Interval:    QRS Duration: 74 QT Interval:  528 QTC Calculation: 653 R Axis:   -20 Text Interpretation:  Atrial flutter with predominant 3:1 AV block  Borderline left axis deviation Low voltage, precordial leads Borderline  repolarization abnormality Prolonged QT interval SINCE LAST TRACING HEART  RATE HAS INCREASED prior tracing nsr Confirmed by Winfred Leeds  MD, SAM  [phone]) on 01/27/2015 11:01:45 PM      MDM   Final diagnoses:  Dyspnea on exertion  Generalized weakness  Dysuria  Long term current use of anticoagulant therapy  Atrial fibrillation, unspecified    Thedora Rings [place] presents with generalized weakness and shortness of breath on exertion.  EKG with atrial flutter. Patient is on Coumadin for A. fib for which she's had an ablation in the past. She is rate controlled at this time.  She takes Cardizem daily.  Denies CP.    Troponin is negative. Fecal occult negative, CBC and CMP unremarkable. Normal glucose. Patient endorses symptoms of urinary tract infection however urinalysis with small leukocytes and 3-6 white blood cells. Chest x-ray with mild chronic bronchitis without superimposed acute findings and mild cardiomegaly.  Patient is unable to get out of bed without becoming short of breath and tachypnea. No hypoxia here in the emergency department  Patient will need  admission for her generalized weakness and a flutter.  12:38 AM Discussed with Dr Hal Hope who will admit to tele.    The patient was discussed with and seen by Dr. Winfred Leeds who agrees with the treatment plan.   Anne Soho Salaya [place]rop, PA-C 01/28/15 [place], MD 01/28/15 [phone]

## 2015-01-27 NOTE — ED Notes (Signed)
Pt presents via EMS from home with c/o weakness. Pt reported to EMS that she has been feeling sick with no energy since Wednesday of this week. Pt denies any blurred vision, dizziness, nausea, vomiting, fever. Pt reports she has been sleeping well and drinking plenty of fluids. Pt ambulatory at home.

## 2015-01-27 NOTE — ED Provider Notes (Signed)
Plains of generalized weakness and dyspnea on exertion over the past 2 weeks. Worse today to the point where she can't walk across the room without becoming dyspneic. She denies any chest pain denies fever denies cough. On exam no distress. Lungs clear auscultation heart irregular. Patient noted to be in atrial flutter with variable block  Orlie Dakin, MD 01/27/15 2314

## 2015-01-28 ENCOUNTER — Encounter (HOSPITAL_COMMUNITY): Payer: Self-pay | Admitting: Internal Medicine

## 2015-01-28 ENCOUNTER — Observation Stay (HOSPITAL_COMMUNITY): Payer: Medicare Other

## 2015-01-28 DIAGNOSIS — I10 Essential (primary) hypertension: Secondary | ICD-10-CM | POA: Diagnosis not present

## 2015-01-28 DIAGNOSIS — R531 Weakness: Secondary | ICD-10-CM | POA: Diagnosis not present

## 2015-01-28 DIAGNOSIS — I482 Chronic atrial fibrillation, unspecified: Secondary | ICD-10-CM | POA: Diagnosis present

## 2015-01-28 LAB — CBC WITH DIFFERENTIAL/PLATELET
BASOS ABS: 0 10*3/uL (ref 0.0–0.1)
Basophils Relative: 1 % (ref 0–1)
EOS ABS: 0.2 10*3/uL (ref 0.0–0.7)
EOS PCT: 4 % (ref 0–5)
HCT: 40.2 % (ref 36.0–46.0)
Hemoglobin: 12.9 g/dL (ref 12.0–15.0)
LYMPHS PCT: 25 % (ref 12–46)
Lymphs Abs: 1.6 10*3/uL (ref 0.7–4.0)
MCH: 30.9 pg (ref 26.0–34.0)
MCHC: 32.1 g/dL (ref 30.0–36.0)
MCV: 96.2 fL (ref 78.0–100.0)
Monocytes Absolute: 0.8 10*3/uL (ref 0.1–1.0)
Monocytes Relative: 12 % (ref 3–12)
NEUTROS ABS: 3.7 10*3/uL (ref 1.7–7.7)
Neutrophils Relative %: 58 % (ref 43–77)
PLATELETS: 137 10*3/uL — AB (ref 150–400)
RBC: 4.18 MIL/uL (ref 3.87–5.11)
RDW: 13.8 % (ref 11.5–15.5)
WBC: 6.4 10*3/uL (ref 4.0–10.5)

## 2015-01-28 LAB — COMPREHENSIVE METABOLIC PANEL
ALBUMIN: 3.5 g/dL (ref 3.5–5.2)
ALK PHOS: 43 U/L (ref 39–117)
ALT: 20 U/L (ref 0–35)
AST: 22 U/L (ref 0–37)
Anion gap: 8 (ref 5–15)
BUN: 17 mg/dL (ref 6–23)
CALCIUM: 9.1 mg/dL (ref 8.4–10.5)
CHLORIDE: 108 mmol/L (ref 96–112)
CO2: 28 mmol/L (ref 19–32)
CREATININE: 1.02 mg/dL (ref 0.50–1.10)
GFR calc non Af Amer: 48 mL/min — ABNORMAL LOW (ref >90)
GFR, EST AFRICAN AMERICAN: 55 mL/min — AB (ref >90)
GLUCOSE: 90 mg/dL (ref 70–99)
Potassium: 4.3 mmol/L (ref 3.5–5.1)
Sodium: 144 mmol/L (ref 135–145)
Total Bilirubin: 0.8 mg/dL (ref 0.3–1.2)
Total Protein: 6.6 g/dL (ref 6.0–8.3)

## 2015-01-28 LAB — D-DIMER, QUANTITATIVE (NOT AT ARMC)

## 2015-01-28 LAB — TSH: TSH: 9.448 u[IU]/mL — ABNORMAL HIGH (ref 0.350–4.500)

## 2015-01-28 LAB — SEDIMENTATION RATE: SED RATE: 21 mm/h (ref 0–22)

## 2015-01-28 LAB — TROPONIN I: Troponin I: <0.03 ng/mL (ref <0.031)

## 2015-01-28 LAB — BRAIN NATRIURETIC PEPTIDE: B Natriuretic Peptide: 86.2 pg/mL (ref 0.0–100.0)

## 2015-01-28 LAB — VITAMIN B12: Vitamin B-12: 788 pg/mL (ref 211–911)

## 2015-01-28 LAB — CK: CK TOTAL: 81 U/L (ref 7–177)

## 2015-01-28 MED ORDER — SALINE SPRAY 0.65 % NA SOLN
1.0000 | Freq: Four times a day (QID) | NASAL | Status: DC | PRN
  Filled 2015-01-28: qty 44

## 2015-01-28 MED ORDER — AMLODIPINE BESYLATE 5 MG PO TABS
2.5000 mg | ORAL_TABLET | Freq: Every day | ORAL | Status: DC
  Administered 2015-01-28 – 2015-01-30 (×3): 2.5 mg via ORAL
  Filled 2015-01-28 (×3): qty 1

## 2015-01-28 MED ORDER — ONDANSETRON HCL 4 MG PO TABS: 4.0000 mg | ORAL_TABLET | Freq: Four times a day (QID) | ORAL | Status: DC | PRN

## 2015-01-28 MED ORDER — ACETAMINOPHEN 325 MG PO TABS
650.0000 mg | ORAL_TABLET | Freq: Four times a day (QID) | ORAL | Status: DC | PRN
  Administered 2015-01-28 – 2015-01-29 (×3): 650 mg via ORAL
  Filled 2015-01-28 (×3): qty 2

## 2015-01-28 MED ORDER — ACETAMINOPHEN 650 MG RE SUPP: 650.0000 mg | Freq: Four times a day (QID) | RECTAL | Status: DC | PRN

## 2015-01-28 MED ORDER — AMIODARONE HCL 100 MG PO TABS
100.0000 mg | ORAL_TABLET | Freq: Every day | ORAL | Status: DC
Start: 2015-01-28 — End: 2015-01-30
  Administered 2015-01-28 – 2015-01-30 (×3): 100 mg via ORAL

## 2015-01-28 MED ORDER — DONEPEZIL HCL 5 MG PO TABS
10.0000 mg | ORAL_TABLET | Freq: Every day | ORAL | Status: DC
  Administered 2015-01-28 – 2015-01-29 (×2): 10 mg via ORAL
  Filled 2015-01-28 (×2): qty 2

## 2015-01-28 MED ORDER — WARFARIN SODIUM 5 MG PO TABS
7.5000 mg | ORAL_TABLET | Freq: Once | ORAL | Status: AC
  Administered 2015-01-28: 7.5 mg via ORAL
  Filled 2015-01-28 (×2): qty 1

## 2015-01-28 MED ORDER — FLUTICASONE PROPIONATE 50 MCG/ACT NA SUSP: 1.0000 | Freq: Every day | NASAL | Status: DC | PRN

## 2015-01-28 MED ORDER — BENZONATATE 100 MG PO CAPS: 100.0000 mg | ORAL_CAPSULE | Freq: Three times a day (TID) | ORAL | Status: DC | PRN

## 2015-01-28 MED ORDER — GABAPENTIN 100 MG PO CAPS
100.0000 mg | ORAL_CAPSULE | Freq: Two times a day (BID) | ORAL | Status: DC
  Administered 2015-01-28 – 2015-01-30 (×5): 100 mg via ORAL
  Filled 2015-01-28 (×5): qty 1

## 2015-01-28 MED ORDER — ADULT MULTIVITAMIN W/MINERALS CH
1.0000 | ORAL_TABLET | Freq: Every day | ORAL | Status: DC
  Administered 2015-01-28 – 2015-01-30 (×3): 1 via ORAL
  Filled 2015-01-28 (×3): qty 1

## 2015-01-28 MED ORDER — ONDANSETRON HCL 4 MG/2ML IJ SOLN
4.0000 mg | Freq: Four times a day (QID) | INTRAMUSCULAR | Status: DC | PRN
  Administered 2015-01-29: 4 mg via INTRAVENOUS
  Filled 2015-01-28: qty 2

## 2015-01-28 MED ORDER — FOLIC ACID 1 MG PO TABS
1.0000 mg | ORAL_TABLET | Freq: Every day | ORAL | Status: DC
  Administered 2015-01-28 – 2015-01-30 (×3): 1 mg via ORAL
  Filled 2015-01-28 (×3): qty 1

## 2015-01-28 MED ORDER — OMEGA-3-ACID ETHYL ESTERS 1 G PO CAPS
1.0000 g | ORAL_CAPSULE | Freq: Every day | ORAL | Status: DC
  Administered 2015-01-28 – 2015-01-30 (×3): 1 g via ORAL
  Filled 2015-01-28 (×3): qty 1

## 2015-01-28 MED ORDER — SODIUM CHLORIDE 0.9 % IJ SOLN
3.0000 mL | Freq: Two times a day (BID) | INTRAMUSCULAR | Status: DC
  Administered 2015-01-28 – 2015-01-29 (×3): 3 mL via INTRAVENOUS

## 2015-01-28 MED ORDER — TRAZODONE HCL 50 MG PO TABS
50.0000 mg | ORAL_TABLET | Freq: Every evening | ORAL | Status: DC | PRN
  Administered 2015-01-29: 25 mg via ORAL
  Filled 2015-01-28: qty 1

## 2015-01-28 MED ORDER — DILTIAZEM HCL ER COATED BEADS 120 MG PO CP24
120.0000 mg | ORAL_CAPSULE | Freq: Every day | ORAL | Status: DC
  Administered 2015-01-28 – 2015-01-30 (×3): 120 mg via ORAL
  Filled 2015-01-28 (×3): qty 1

## 2015-01-28 MED ORDER — WARFARIN - PHARMACIST DOSING INPATIENT: Freq: Every day | Status: DC

## 2015-01-28 MED ORDER — LORAZEPAM 1 MG PO TABS
1.0000 mg | ORAL_TABLET | Freq: Four times a day (QID) | ORAL | Status: DC | PRN
  Administered 2015-01-28: 1 mg via ORAL
  Filled 2015-01-28: qty 1

## 2015-01-28 NOTE — H&P (Signed)
[facility] History and Physical  Denelda Akerley Wooley BHA:[id number] DOB: Nov 08, 1923 DOA: 01/27/2015  Referring physician: Ms. Jarrett Soho. ER PA. PCP: Mathews Argyle, MD  Specialists: Dr. Rayann Heman. Cardiologist.  Chief Complaint: Generalized weakness.  HPI: Anne Hayes is a 81 y.o. female with history of atrial fibrillation and hypertension presents to the ER because of weakness. Patient states over the last 3 weeks patient has been very weak to the point she becomes very fatigued on minimal exertion. Denies any shortness of breath chest pain dizziness loss of consciousness nausea vomiting diarrhea fever chills. Patient on exam is nonfocal. Patient denies having started on any new medications or missed her medications. Chest x-ray urinalysis were unremarkable in the ER fecal occult blood was negative. EKG showed atrial flutter with controlled rate. Patient has been admitted for further observation.   Review of Systems: As presented in the history of presenting illness, rest negative.  Past Medical History  Diagnosis Date  . Esophageal stricture   . Hypertension   . Persistent atrial fibrillation   . Cerebrovascular disease, unspecified   . Hyperlipidemia   . Unspecified disorder of kidney and ureter   . Renal cancer   . Generalized anxiety disorder   . Hiatal hernia   . PN (peripheral neuropathy)     in feet  . Rectal fissure   . Gastric ulcer   . IBS (irritable bowel syndrome)   . Insomnia   . Adenomatous colon polyp 2013  . Diverticulosis   . Skin cancer of nose   . Hx of cardiovascular stress test     Lexiscan Myoview (07/2013): No ischemia, EF 69%, normal study  . PONV (postoperative nausea and vomiting)   . Heart murmur   . Chronic bronchitis     "most q yr"   . GERD (gastroesophageal reflux disease)     "not bothered w/it lately" (01/11/2014)  . DJD (degenerative joint disease)   . Osteoarthrosis, unspecified whether generalized or localized, unspecified site    . Chronic lower back pain   . Ankle fracture, right     treated conservatively, non-displaced    Past Surgical History  Procedure Laterality Date  . Appendectomy    . Cholecystectomy    . Bunionectomies  Bilateral   . Anterior and posterior vaginal repair  1989    AP REPAIR & RAZ URETHROPEXY  . Renal cryoablation Right 07/2009    s/p percutaneous cryoablation of right renal cell cancer; by [name]  . Ablation  11/07/10    [facility] by Dr Rayann Heman  . Tonsillectomy    . Abdominal hysterectomy  1977    "partial"  . Cataract extraction w/ intraocular lens  implant, bilateral Bilateral   . Skin cancer excision      "nose; took skin from face and grafted area"   Social History:  reports that she quit smoking about 26 years ago. Her smoking use included Cigarettes. She has a 50 pack-year smoking history. She has never used smokeless tobacco. She reports that she drinks alcohol. She reports that she does not use illicit drugs. Where does patient live independent living facility. Can patient participate in ADLs? Yes.  Allergies  Allergen Reactions  . Other Nausea And Vomiting    Any kind of BELL PEPPER.   Extreme, violent nausea and vomiting quickly leading to dehydration  . Codeine Nausea And Vomiting       . Penicillins Nausea And Vomiting  . Sulfa Antibiotics Itching    Family History:  Family History  Problem Relation Age of Onset  . Heart disease Father   . Colon cancer Neg Hx   . Stroke Mother       Prior to Admission medications   Medication Sig Start Date End Date Taking? Authorizing Provider  acetaminophen (TYLENOL) 500 MG tablet Take 1,000 mg by mouth every 6 (six) hours as needed for mild pain or headache.   Yes Historical Provider, MD  amiodarone (PACERONE) 200 MG tablet TAKE 1/2 TABLET DAILY. 11/03/14  Yes Thompson Grayer, MD  amLODipine (NORVASC) 5 MG tablet Take 2.5 mg by mouth daily.   Yes Historical Provider, MD  diltiazem (CARDIZEM CD) 120 MG 24 hr capsule Take 1  capsule (120 mg total) by mouth daily. 06/13/14  Yes Thompson Grayer, MD  donepezil (ARICEPT) 10 MG tablet Take 10 mg by mouth at bedtime.   Yes Historical Provider, MD  fluticasone (FLONASE) 50 MCG/ACT nasal spray Place 1 spray into both nostrils daily as needed for allergies or rhinitis.   Yes Historical Provider, MD  gabapentin (NEURONTIN) 100 MG capsule Take 300 mg by mouth 2 (two) times daily.    Yes Historical Provider, MD  LORazepam (ATIVAN) 1 MG tablet Take 1 tablet (1 mg total) by mouth every 6 (six) hours as needed for anxiety or sleep. 04/09/14  Yes Peter Dammen, PA-C  omega-3 acid ethyl esters (LOVAZA) 1 G capsule Take 1 g by mouth daily.   Yes Historical Provider, MD  Polyethyl Glycol-Propyl Glycol (SYSTANE ULTRA OP) Place 1 drop into both eyes daily as needed (dry eyes).   Yes Historical Provider, MD  traZODone (DESYREL) 50 MG tablet Take 50 mg by mouth at bedtime as needed for sleep.   Yes Historical Provider, MD  warfarin (COUMADIN) 5 MG tablet Take 5-7.5 mg by mouth daily. Take 1 tablet all days except Thursday take 1.5 tablets   Yes Historical Provider, MD  benzonatate (TESSALON) 100 MG capsule Take 100 mg by mouth 3 (three) times daily as needed for cough.    Historical Provider, MD  Cholecalciferol (VITAMIN D PO) Take 1 capsule by mouth daily.    Historical Provider, MD  ciprofloxacin (CIPRO) 250 MG tablet Take 1 tablet by mouth 2 (two) times daily. 06/10/14   Historical Provider, MD  folic acid (FOLVITE) 1 MG tablet Take 1 mg by mouth daily.    Historical Provider, MD  Multiple Vitamin (MULTIVITAMIN WITH MINERALS) TABS tablet Take 1 tablet by mouth daily.    Historical Provider, MD  sodium chloride (OCEAN) 0.65 % SOLN nasal spray Place 1 spray into both nostrils 4 (four) times daily as needed for congestion.    Historical Provider, MD    Physical Exam: Filed Vitals:   01/27/15 2053 01/27/15 2302 01/28/15 0119 01/28/15 0207  BP: 128/73 132/77 141/106 141/67  Pulse: 74 81 89 84   Temp:  98.2 F (36.8 C)  97.8 F (36.6 C)  TempSrc:  Rectal  Oral  Resp: 18 18 18 18   Height:    5\' 7"  (1.702 m)  Weight:    66.8 kg (147 lb 4.3 oz)  SpO2: 94% 96% 95% 95%     General:  Moderately better nourished.  Eyes: Anicteric. No pallor.  ENT: No discharge from the ears eyes nose or mouth.  Neck: No mass felt.  Cardiovascular: S1-S2 heard.  Respiratory: No rhonchi or crepitations.  Abdomen: Soft nontender bowel sounds present.  Skin: No rash.  Musculoskeletal: No edema.  Psychiatric: Appears normal.  Neurologic: Alert awake oriented to time  place and person. Moves all extremities 5 x 5. Poor deep tendon reflexes. PERRLA positive. No facial asymmetry.  Labs on Admission:  Basic Metabolic Panel:  Recent Labs Lab 01/27/15 2109  NA 140  K 4.4  CL 108  CO2 26  GLUCOSE 112*  BUN 19  CREATININE 1.04  CALCIUM 9.3   Liver Function Tests:  Recent Labs Lab 01/27/15 2109  AST 26  ALT 20  ALKPHOS 46  BILITOT 0.5  PROT 6.8  ALBUMIN 3.8   No results for input(s): LIPASE, AMYLASE in the last 168 hours. No results for input(s): AMMONIA in the last 168 hours. CBC:  Recent Labs Lab 01/27/15 2109  WBC 6.2  HGB 12.9  HCT 40.5  MCV 96.9  PLT 147*   Cardiac Enzymes:  Recent Labs Lab 01/27/15 2320  TROPONINI <0.03    BNP (last 3 results)  Recent Labs  01/27/15 2108  BNP 86.2    ProBNP (last 3 results) No results for input(s): PROBNP in the last 8760 hours.  CBG:  Recent Labs Lab 01/27/15 2104  GLUCAP 99    Radiological Exams on Admission: Dg Chest Port 1 View  01/27/2015   CLINICAL DATA:  Weakness. Hypertension. Chronic bronchitis. Ex-smoker.  EXAM: PORTABLE CHEST - 1 VIEW  COMPARISON:  11/29/2013  FINDINGS: Midline trachea. Cardiomegaly accentuated by AP portable technique. Atherosclerosis in the transverse aorta. No pleural effusion or pneumothorax. Pulmonary interstitial thickening is moderate, and accentuated by AP portable  technique. No lobar consolidation. Mild scarring at the left lung base.  IMPRESSION: COPD/chronic bronchitis, without superimposed acute finding.  Mild cardiomegaly with aortic atherosclerosis.   Electronically Signed   By: Abigail Miyamoto M.D.   On: 01/27/2015 22:11    EKG: Independently reviewed. Atrial flutter controlled rate.  Assessment/Plan Principal Problem:   Generalized weakness Active Problems:   Essential hypertension   Chronic atrial fibrillation   1. Generalized weakness - cause not clear. Patient appears nonfocal on exam. Given that patient is on amiodarone we will check TSH for any hypothyroidism. Check CK levels and sedimentation rate for any polymyalgia/myositis. Given the history of atrial flutter of check MRI brain. Get physical therapy consult. 2. Atrial flutter/fibrillation presently controlled rate - chads 2 vasc score is more than 2. Coumadin per pharmacy. Continue amiodarone and metoprolol and Cardizem. 3. Hypertension - continue home medications.   DVT Prophylaxis on Coumadin.  Code Status: Full code.  Family Communication: Discussed with patient.  Disposition Plan: Admit for observation.    KAKRAKANDY,ARSHAD N. [facility] Pager [phone].  If 7PM-7AM, please contact night-coverage [email or web address] Password [facility] 01/28/2015, 3:47 AM

## 2015-01-28 NOTE — Care Management (Signed)
Utilization Review completed.  

## 2015-01-28 NOTE — Progress Notes (Addendum)
Patient ID: Anne Hayes, female   DOB: 14-Nov-1923, 81 y.o.   MRN: [id number] [facility] PROGRESS NOTE  Anne Hayes Seevers YBO:[id number] DOB: Aug 15, 1924 DOA: 02/15/15 PCP: Mathews Argyle, MD  Brief narrative:    Addendum to admission note done today 01/28/2015  81 y.o. female with past medical history of atrial fibrillation (on [facility] with coumadin), hypertension who presented to [facility] ED with generalized weakness over past few weeks prior to this admission associated with difficulty getting around and performing ADL's. No reports of dizziness or lightheadedness or falls.  On admission, pt was hemodynamically stable. CXR and UA were unremarkable. PT has evaluated the pt and recommended SNF.  Assessment/Plan:    Principal Problem:   Generalized weakness - Unclear etiology - Appreciate PT eval and recommendations - SW consulted to assist with discharge plan.   Active Problems:   Essential hypertension - Continue Norvasc, Cardizem    Chronic atrial fibrillation - CHADS vasc score at least 3 - Continue coumadin - Rate controlled with Cardizem    DVT Prophylaxis  - on AC with coumadin   Code Status: Full.    IV access:  Peripheral IV  Procedures and diagnostic studies:    Dg Chest Port 1 View 2015-02-15  COPD/chronic bronchitis, without superimposed acute finding.  Mild cardiomegaly with aortic atherosclerosis.   Electronically Signed   By: Abigail Miyamoto M.D.   On: 2015-02-15 22:11   Medical Consultants:  None   Other Consultants:  Physical therapy  IAnti-Infectives:   None    Leisa Lenz, MD  [facility] Pager [phone]  If 7PM-7AM, please contact night-coverage [email or web address] Password [email or web address] 01/28/2015, 11:45 AM    HPI/Subjective: No acute overnight events. Patient reports no nausea or vomiting. No cough.   Objective: Filed Vitals:   01/28/15 0207 01/28/15 0447 01/28/15 0955 01/28/15 1059  BP: 141/67 151/90 137/95 149/97  Pulse: 84 63  100   Temp: 97.8 F (36.6 C) 97.7 F (36.5 C)    TempSrc: Oral Oral    Resp: 18 18    Height: 5\' 7"  (1.702 m)     Weight: 66.8 kg (147 lb 4.3 oz)     SpO2: 95% 94% 99%     Intake/Output Summary (Last 24 hours) at 01/28/15 1145 Last data filed at 01/28/15 0232  Gross per 24 hour  Intake      0 ml  Output    275 ml  Net   -275 ml    Exam:   General:  Pt is  not in acute distress  Cardiovascular: Regular rate and rhythm, S1/S2 (+)  Respiratory: no wheezing, no crackles, no rhonchi   Data Reviewed: Basic Metabolic Panel:  Recent Labs Lab 2015/02/15 2109 01/28/15 0558  NA 140 144  K 4.4 4.3  CL 108 108  CO2 26 28  GLUCOSE 112* 90  BUN 19 17  CREATININE 1.04 1.02  CALCIUM 9.3 9.1   Liver Function Tests:  Recent Labs Lab 02/15/15 2109 01/28/15 0558  AST 26 22  ALT 20 20  ALKPHOS 46 43  BILITOT 0.5 0.8  PROT 6.8 6.6  ALBUMIN 3.8 3.5   No results for input(s): LIPASE, AMYLASE in the last 168 hours. No results for input(s): AMMONIA in the last 168 hours. CBC:  Recent Labs Lab Feb 15, 2015 2109 01/28/15 0558  WBC 6.2 6.4  NEUTROABS  --  3.7  HGB 12.9 12.9  HCT 40.5 40.2  MCV 96.9 96.2  PLT 147* 137*   Cardiac Enzymes:  Recent  Labs Lab 01/27/15 2320 01/28/15 0558  CKTOTAL  --  81  TROPONINI <0.03  --    BNP: Invalid input(s): POCBNP CBG:  Recent Labs Lab 01/27/15 2104  GLUCAP 99    No results found for this or any previous visit (from the past 240 hour(s)).   Scheduled Meds: . amiodarone  100 mg Oral Daily  . amLODipine  2.5 mg Oral Daily  . diltiazem  120 mg Oral Daily  . donepezil  10 mg Oral QHS  . folic acid  1 mg Oral Daily  . gabapentin  100 mg Oral BID  . multivitamin with min  1 tablet Oral Daily  . omega-3 acid ethyl est  1 g Oral Daily  . Warfarin    Does not apply q1800

## 2015-01-28 NOTE — Evaluation (Signed)
Physical Therapy Evaluation Patient Details Name: Anne Hayes MRN: [id number] DOB: 08/27/1924 Today's Date: 01/28/2015   History of Present Illness  pt admit with complaints of being very weak over last few weeks, outside of her normal active ability.   Clinical Impression  Pt repeorting feeling :wiped out" adn very weak. Noticeable even sitting EOB , with variable HR fluciations and pt systomatic. sitting EOB very variable with pt feeling systioms when HR would rise above 120s to 130s , then quickly back down to 60s within minutes.. vary variable within our sitting EOB and with short walk to doorway, pt very weak and HR up to 134. To continue to benefit from PT while here and recommend ST-SNF unless pt progresses /improves ability while here.      Follow Up Recommendations SNF (depending on progress here)    Equipment Recommendations  None recommended by PT    Recommendations for Other Services OT consult     Precautions / Restrictions        Mobility  Bed Mobility Overal bed mobility: Needs Assistance Bed Mobility: Supine to Sit     Supine to sit: Min assist Sit to supine: Min assist   General bed mobility comments: pt just seeming " wiped out" with enery therefore requiring assistance to get LEs into bed and to help scoot.   Transfers Overall transfer level: Needs assistance Equipment used: Rolling walker (2 wheeled) Transfers: Sit to/from Stand Sit to Stand: Mod assist         General transfer comment: assist to rise due to wekaness feeling   Ambulation/Gait Ambulation/Gait assistance: Min assist Ambulation Distance (Feet): 10 Feet Assistive device: Rolling walker (2 wheeled) Gait Pattern/deviations: Step-through pattern     General Gait Details: limited due to pt feelign very "wiped out" and noticed HR up to 130s.   Stairs            Wheelchair Mobility    Modified Rankin (Stroke Patients Only)       Balance                                              Pertinent Vitals/Pain Pain Assessment: No/denies pain    Home Living Family/patient expects to be discharged to:: Private residence Living Arrangements: Alone Available Help at Discharge: Other (Comment) (has apersonal care attendane t who can check on her, her dtr lives at [facility]) Type of Home: [place] Access: Level entry     Home Layout: One level Home Equipment: [place] - 2 wheels;Walker - 4 wheels;Cane - single point      Prior Function Level of Independence: Independent         Comments: pt very worried for prior to 2-3 weeks ago she was using cane only outside of her apartment, however now she is using RW in apratment and so weak that she cannot manage at this time. Very worried.      Hand Dominance        Extremity/Trunk Assessment               Lower Extremity Assessment: Generalized weakness         Communication   Communication: No difficulties  Cognition Arousal/Alertness: Awake/alert Behavior During Therapy: WFL for tasks assessed/performed Overall Cognitive Status: Within Functional Limits for tasks assessed  General Comments General comments (skin integrity, edema, etc.): reported dizziness on and off while sitting EOB , however reported not feeling this while rolling in bed.     Exercises        Assessment/Plan    PT Assessment Patient needs continued PT services  PT Diagnosis Generalized weakness   PT Problem List Decreased strength;Decreased activity tolerance;Decreased mobility;Decreased balance;Decreased safety awareness  PT Treatment Interventions Gait training;DME instruction;Functional mobility training;Therapeutic activities;Therapeutic exercise   PT Goals (Current goals can be found in the Care Plan section) Acute Rehab PT Goals Patient Stated Goal: I want to get my to my normal self PT Goal Formulation: With patient Time For Goal Achievement:  02/06/15 Potential to Achieve Goals: Good    Frequency Min 3X/week   Barriers to discharge        Co-evaluation               End of Session Equipment Utilized During Treatment: Gait belt Activity Tolerance: Patient limited by fatigue Patient left: in bed Nurse Communication: Mobility status    Functional Assessment Tool Used: clincial reasoning Functional Limitation: Mobility: Walking and moving around Mobility: Walking and Moving Around Current Status (Z6109): At least 1 percent but less than 20 percent impaired, limited or restricted Mobility: Walking and Moving Around Goal Status [phone]): At least 1 percent but less than 20 percent impaired, limited or restricted    Time: 0930-1000 PT Time Calculation (min) (ACUTE ONLY): 30 min   Charges:   PT Evaluation $Initial PT Evaluation Tier I: 1 Procedure PT Treatments $Therapeutic Activity: 8-22 mins   PT G Codes:   PT G-Codes **NOT FOR INPATIENT CLASS** Functional Assessment Tool Used: clincial reasoning Functional Limitation: Mobility: Walking and moving around Mobility: Walking and Moving Around Current Status (U9811): At least 1 percent but less than 20 percent impaired, limited or restricted Mobility: Walking and Moving Around Goal Status [phone]): At least 1 percent but less than 20 percent impaired, limited or restricted    Clide Dales 01/28/2015, 10:10 AM Clide Dales, PT Pager: [phone] 01/28/2015

## 2015-01-28 NOTE — Progress Notes (Signed)
ANTICOAGULATION CONSULT NOTE - Initial Consult  Pharmacy Consult for Warfarin Indication: atrial fibrillation  Allergies  Allergen Reactions  . Other Nausea And Vomiting    Any kind of BELL PEPPER.   Extreme, violent nausea and vomiting quickly leading to dehydration  . Codeine Nausea And Vomiting       . Penicillins Nausea And Vomiting  . Sulfa Antibiotics Itching    Patient Measurements: Height: 5\' 7"  (170.2 cm) Weight: 147 lb 4.3 oz (66.8 kg) IBW/kg (Calculated) : 61.6   Vital Signs: Temp: 97.7 F (36.5 C) (04/30 0447) Temp Source: Oral (04/30 0447) BP: 151/90 mmHg (04/30 0447) Pulse Rate: 63 (04/30 0447)  Labs:  Recent Labs  01/27/15 1515 01/27/15 2109 01/27/15 2320  HGB  --  12.9  --   HCT  --  40.5  --   PLT  --  147*  --   LABPROT  --   --  19.2*  INR 1.7  --  1.60*  CREATININE  --  1.04  --   TROPONINI  --   --  <0.03    Estimated Creatinine Clearance: 36.4 mL/min (by C-G formula based on Cr of 1.04).   Medical History: Past Medical History  Diagnosis Date  . Esophageal stricture   . Hypertension   . Persistent atrial fibrillation   . Cerebrovascular disease, unspecified   . Hyperlipidemia   . Unspecified disorder of kidney and ureter   . Renal cancer   . Generalized anxiety disorder   . Hiatal hernia   . PN (peripheral neuropathy)     in feet  . Rectal fissure   . Gastric ulcer   . IBS (irritable bowel syndrome)   . Insomnia   . Adenomatous colon polyp 2013  . Diverticulosis   . Skin cancer of nose   . Hx of cardiovascular stress test     Lexiscan Myoview (07/2013): No ischemia, EF 69%, normal study  . PONV (postoperative nausea and vomiting)   . Heart murmur   . Chronic bronchitis     "most q yr"   . GERD (gastroesophageal reflux disease)     "not bothered w/it lately" (01/11/2014)  . DJD (degenerative joint disease)   . Osteoarthrosis, unspecified whether generalized or localized, unspecified site   . Chronic lower back pain    . Ankle fracture, right     treated conservatively, non-displaced     Medications:  Prescriptions prior to admission  Medication Sig Dispense Refill Last Dose  . acetaminophen (TYLENOL) 500 MG tablet Take 1,000 mg by mouth every 6 (six) hours as needed for mild pain or headache.   Past Month at Unknown time  . amiodarone (PACERONE) 200 MG tablet TAKE 1/2 TABLET DAILY. 45 tablet 0 01/27/2015 at Unknown time  . amLODipine (NORVASC) 5 MG tablet Take 2.5 mg by mouth daily.   01/27/2015 at Unknown time  . diltiazem (CARDIZEM CD) 120 MG 24 hr capsule Take 1 capsule (120 mg total) by mouth daily. 90 capsule 3 01/27/2015 at Unknown time  . donepezil (ARICEPT) 10 MG tablet Take 10 mg by mouth at bedtime.   01/26/2015 at Unknown time  . fluticasone (FLONASE) 50 MCG/ACT nasal spray Place 1 spray into both nostrils daily as needed for allergies or rhinitis.   Past Month at Unknown time  . gabapentin (NEURONTIN) 100 MG capsule Take 300 mg by mouth 2 (two) times daily.    01/27/2015 at Unknown time  . LORazepam (ATIVAN) 1 MG tablet Take 1 tablet (  1 mg total) by mouth every 6 (six) hours as needed for anxiety or sleep. 6 tablet 0 01/27/2015 at Unknown time  . omega-3 acid ethyl esters (LOVAZA) 1 G capsule Take 1 g by mouth daily.   01/27/2015 at Unknown time  . Polyethyl Glycol-Propyl Glycol (SYSTANE ULTRA OP) Place 1 drop into both eyes daily as needed (dry eyes).   Past Week at Unknown time  . traZODone (DESYREL) 50 MG tablet Take 50 mg by mouth at bedtime as needed for sleep.   Past Week at Unknown time  . warfarin (COUMADIN) 5 MG tablet Take 5-7.5 mg by mouth daily. Take 1 tablet all days except Thursday take 1.5 tablets   01/26/2015 at Unknown time  . benzonatate (TESSALON) 100 MG capsule Take 100 mg by mouth 3 (three) times daily as needed for cough.   Taking  . Cholecalciferol (VITAMIN D PO) Take 1 capsule by mouth daily.   Taking  . ciprofloxacin (CIPRO) 250 MG tablet Take 1 tablet by mouth 2 (two) times  daily.   Taking  . folic acid (FOLVITE) 1 MG tablet Take 1 mg by mouth daily.   Unknown  . Multiple Vitamin (MULTIVITAMIN WITH MINERALS) TABS tablet Take 1 tablet by mouth daily.   Taking  . sodium chloride (OCEAN) 0.65 % SOLN nasal spray Place 1 spray into both nostrils 4 (four) times daily as needed for congestion.   unknown at unknown   Scheduled:  . amiodarone  100 mg Oral Daily  . amLODipine  2.5 mg Oral Daily  . diltiazem  120 mg Oral Daily  . donepezil  10 mg Oral QHS  . folic acid  1 mg Oral Daily  . multivitamin with minerals  1 tablet Oral Daily  . omega-3 acid ethyl esters  1 g Oral Daily  . sodium chloride  3 mL Intravenous Q12H  . warfarin  7.5 mg Oral Once  . Warfarin - Pharmacist Dosing Inpatient   Does not apply q1800   Infusions:    Assessment: 67 yoF c/o generalized weakness/dyspnea on exertion x 2 weeks.  Warfarin per Rx for A-fib.   Goal of Therapy:  INR 2-3    Plan:   Warfarin 7.5mg  x1 this am  Daily PT/INR  Education  Anne Hayes R 01/28/2015,6:10 AM

## 2015-01-29 DIAGNOSIS — G9340 Encephalopathy, unspecified: Secondary | ICD-10-CM | POA: Diagnosis present

## 2015-01-29 DIAGNOSIS — R531 Weakness: Secondary | ICD-10-CM | POA: Diagnosis not present

## 2015-01-29 DIAGNOSIS — I4891 Unspecified atrial fibrillation: Secondary | ICD-10-CM | POA: Diagnosis not present

## 2015-01-29 DIAGNOSIS — I10 Essential (primary) hypertension: Secondary | ICD-10-CM | POA: Diagnosis not present

## 2015-01-29 LAB — URINE CULTURE

## 2015-01-29 LAB — PROTIME-INR
INR: 1.55 — ABNORMAL HIGH (ref 0.00–1.49)
Prothrombin Time: 18.8 seconds — ABNORMAL HIGH (ref 11.6–15.2)

## 2015-01-29 MED ORDER — WARFARIN SODIUM 2.5 MG PO TABS
7.5000 mg | ORAL_TABLET | Freq: Once | ORAL | Status: AC
  Administered 2015-01-29: 7.5 mg via ORAL
  Filled 2015-01-29 (×2): qty 1

## 2015-01-29 NOTE — Clinical Social Work Note (Signed)
Clinical Social Work Assessment  Patient Details  Name: Anne Hayes MRN: [id number] Date of Birth: 03-03-1924  Date of referral:  01/29/15               Reason for consult:  Facility Placement                Permission sought to share information with:  Facility Art therapist granted to share information::  Yes, Verbal Permission Granted  Name::        Agency::     Relationship::     Contact Information:     Housing/Transportation Living arrangements for the past 2 months:  [place] of Information:  Patient, Facility Patient Interpreter Needed:  None Criminal Activity/Legal Involvement Pertinent to Current Situation/Hospitalization:    Significant Relationships:  Adult Children Lives with:  Self Do you feel safe going back to the place where you live?  No Need for family participation in patient care:  No (Coment)  Care giving concerns:  CSW received consult for SNF placement, reviewed PT evaluation recommending SNF at discharge.    Social Worker assessment / plan:  CSW spoke with patient to confirm that she was living in the independent cottage at [place], due to patient being in the hospital under "observation status" Medicare would not cover her stay at [facility]. CSW confirmed with Claiborne Billings at [facility] that patient would be able to use her "free days" as a resident of [facility], they get 30 "free days" each year to use in the [facility]/SNF.   Employment status:  Retired Forensic scientist:  Medicare PT Recommendations:  [place] / Referral to community resources:  [place]  Patient/Family's Response to care:  Patient is agreeable with going to the SNF at discharge.   Patient/Family's Understanding of and Emotional Response to Diagnosis, Current Treatment, and Prognosis:  Patient requested a private room at [facility] but [place] checked and they do not have any available, patient will be on  the waiting list for a private room.   Emotional Assessment Appearance:  Appears younger than stated age Attitude/Demeanor/Rapport:    Affect (typically observed):  Calm, Pleasant, Accepting Orientation:  Oriented to Self, Oriented to Place, Oriented to  Time, Oriented to Situation Alcohol / Substance use:    Psych involvement (Current and /or in the community):  No (Comment)  Discharge Needs  Concerns to be addressed:    Readmission within the last 30 days:    Current discharge risk:    Barriers to Discharge:      Standley Brooking, LCSW 01/29/2015, 4:31 PM

## 2015-01-29 NOTE — Clinical Social Work Placement (Signed)
   CLINICAL SOCIAL WORK PLACEMENT  NOTE  Date:  01/29/2015  Patient Details  Name: Anne Hayes MRN: [id number] Date of Birth: 30-Dec-1923  Clinical Social Work is seeking post-discharge placement for this patient at the [place] level of care (*CSW will initial, date and re-position this form in  chart as items are completed):  Yes   Patient/family provided with [place] Work Department's list of facilities offering this level of care within the geographic area requested by the patient (or if unable, by the patient's family).  Yes   Patient/family informed of their freedom to choose among providers that offer the needed level of care, that participate in Medicare, Medicaid or managed care program needed by the patient, have an available bed and are willing to accept the patient.  Yes   Patient/family informed of [facility]'s ownership interest in [facility] and [facility], as well as of the fact that they are under no obligation to receive care at these facilities.  PASRR submitted to EDS on 01/29/15     PASRR number received on 01/29/15     Existing PASRR number confirmed on       FL2 transmitted to all facilities in geographic area requested by pt/family on 01/29/15     FL2 transmitted to all facilities within larger geographic area on       Patient informed that his/her managed care company has contracts with or will negotiate with certain facilities, including the following:        Yes   Patient/family informed of bed offers received.  Patient chooses bed at [facility]     Physician recommends and patient chooses bed at      Patient to be transferred to [facility] on  .  Patient to be transferred to facility by       Patient family notified on   of transfer.  Name of family member notified:        PHYSICIAN       Additional Comment:    _______________________________________________ Standley Brooking,  LCSW 01/29/2015, 4:36 PM

## 2015-01-29 NOTE — Progress Notes (Addendum)
Patient ID: Anne Hayes, female   DOB: Jul 01, 1924, 81 y.o.   MRN: [id number] [facility] PROGRESS NOTE  Anne Hayes RFF:[id number] DOB: 1924/02/03 DOA: 01/27/2015 PCP: Mathews Argyle, MD  Brief narrative:    81 y.o. female with past medical history of atrial fibrillation (on [facility] with coumadin), hypertension who presented to [facility] ED with generalized weakness over past few weeks prior to this admission associated with difficulty getting around and performing ADL's. No reports of dizziness or lightheadedness or falls.   On admission, pt was hemodynamically stable. CXR and UA were unremarkable. PT has evaluated the pt and recommended SNF.  Barrier to discharge: awaiting SW consult to see if pt qualifies for SNF placement considering she is admitted as observation.   Assessment/Plan:    Principal Problem:   Generalized weakness - Unclear etiology. Per PT eval - SNF recommended. - Awaiting SW consult to see if we can start the process of SNF placement if she qualifies.  Active Problems:   Acute encephalopathy / Dementia - MRI brain showed chronic small vessel ischemic changes but other interpretation mentions white matter signal in right temporal lobe ? Herpes encephalitis or neoplasm. Pt has no fever and normal WBC count. May be worthwhile repeating MRI in next week or so to follow up on this finding. - Continue aricept    Essential hypertension - Continue Norvasc, Cardizem - BP stable    Dyslipidemia - Continue omega 3 supplementation     Chronic atrial fibrillation - CHADS vasc score at least 3 - Continue amiodarone 100 mg daily  - Continue coumadin, pharmacy dosing  - Rate controlled with Cardizem    DVT Prophylaxis  - on AC with coumadin   Code Status: Full.  Family communication: updated the daughter over the phone 4/30; left VM 5/1 on her cell phone [phone], Pam.   IV access:  Peripheral IV  Procedures and diagnostic studies:    Dg Chest Port 1  View 01/27/2015  COPD/chronic bronchitis, without superimposed acute finding.  Mild cardiomegaly with aortic atherosclerosis.   Electronically Signed   By: Abigail Miyamoto M.D.   On: 01/27/2015 22:11   MRI brain 01/28/2015 - Atrophy and chronic small vessel ischemic changes. No sign of acute or subacute infarction or other cause of restricted diffusion. Since the study of 01/13/2014, there is focally more abnormal white matter signal in the right temporal lobe. This could be a region that has been affected by interval white matter infarctions. Theoretically, consideration is given to the possibility of an infiltrating white matter neoplasm or a very early manifestation of herpes encephalitis. I cannot specifically establish either of those diagnoses. Should there be clinical worsening, re- imaging might be useful.  Medical Consultants:  None   Other Consultants:  Physical therapy  IAnti-Infectives:   None    Leisa Lenz, MD  [facility] Pager [phone]  If 7PM-7AM, please contact night-coverage [email or web address] Password [facility] 01/29/2015, 5:44 PM    HPI/Subjective: No acute overnight events. Patient reports no nausea or vomiting. No shortness of breath, no cough, no nausea or vomiting.   Objective: Filed Vitals:   01/28/15 2126 01/29/15 0546 01/29/15 1139 01/29/15 1547  BP: 104/77 114/77 131/77 129/85  Pulse: 100 89  93  Temp: 97.8 F (36.6 C) 97.6 F (36.4 C)  97.8 F (36.6 C)  TempSrc: Oral Oral  Oral  Resp: 18 18  20   Height:      Weight:      SpO2: 97% 97%  98%  Intake/Output Summary (Last 24 hours) at 01/29/15 1744 Last data filed at 01/29/15 1547  Gross per 24 hour  Intake    600 ml  Output      0 ml  Net    600 ml    Exam:   General:  Pt is awake, not in acute distress  Cardiovascular: Regular rate and rhythm, S1/S2 appreciated   Respiratory: bilateral air entry, no wheezing  Abdomen: non tender, non distended, (+) BS  Ext: no edema, pulses  palpable   Data Reviewed: Basic Metabolic Panel:  Recent Labs Lab 01/27/15 2109 01/28/15 0558  NA 140 144  K 4.4 4.3  CL 108 108  CO2 26 28  GLUCOSE 112* 90  BUN 19 17  CREATININE 1.04 1.02  CALCIUM 9.3 9.1   Liver Function Tests:  Recent Labs Lab 01/27/15 2109 01/28/15 0558  AST 26 22  ALT 20 20  ALKPHOS 46 43  BILITOT 0.5 0.8  PROT 6.8 6.6  ALBUMIN 3.8 3.5   No results for input(s): LIPASE, AMYLASE in the last 168 hours. No results for input(s): AMMONIA in the last 168 hours. CBC:  Recent Labs Lab 01/27/15 2109 01/28/15 0558  WBC 6.2 6.4  NEUTROABS  --  3.7  HGB 12.9 12.9  HCT 40.5 40.2  MCV 96.9 96.2  PLT 147* 137*   Cardiac Enzymes:  Recent Labs Lab 01/27/15 2320 01/28/15 0558  CKTOTAL  --  81  TROPONINI <0.03  --    BNP: Invalid input(s): POCBNP CBG:  Recent Labs Lab 01/27/15 2104  GLUCAP 99    Recent Results (from the past 240 hour(s))  Urine culture     Status: None   Collection Time: 01/27/15  9:01 PM  Result Value Ref Range Status   Specimen Description URINE, CLEAN CATCH  Final   Special Requests NONE  Final   Colony Count   Final    20,OOO COLONIES/ML Performed at [employer] [facility]   Final    Multiple bacterial morphotypes present, none predominant. Suggest appropriate recollection if clinically indicated. Performed at [facility]    Report Status 01/29/2015 FINAL  Final     Scheduled Meds: . amiodarone  100 mg Oral Daily  . amLODipine  2.5 mg Oral Daily  . diltiazem  120 mg Oral Daily  . donepezil  10 mg Oral QHS  . folic acid  1 mg Oral Daily  . gabapentin  100 mg Oral BID  . multivitamin with min  1 tablet Oral Daily  . omega-3 acid ethyl est  1 g Oral Daily  . Warfarin    Does not apply q1800

## 2015-01-29 NOTE — Progress Notes (Signed)
ANTICOAGULATION CONSULT NOTE - Initial Consult  Pharmacy Consult for Warfarin Indication: atrial fibrillation  Allergies  Allergen Reactions  . Other Nausea And Vomiting    Any kind of BELL PEPPER.   Extreme, violent nausea and vomiting quickly leading to dehydration  . Codeine Nausea And Vomiting       . Penicillins Nausea And Vomiting  . Sulfa Antibiotics Itching    Patient Measurements: Height: 5\' 7"  (170.2 cm) Weight: 147 lb 4.3 oz (66.8 kg) IBW/kg (Calculated) : 61.6   Vital Signs: Temp: 97.6 F (36.4 C) (05/01 0546) Temp Source: Oral (05/01 0546) BP: 114/77 mmHg (05/01 0546) Pulse Rate: 89 (05/01 0546)  Labs:  Recent Labs  01/27/15 1515 01/27/15 2109 01/27/15 2320 01/28/15 0558 01/29/15 0506  HGB  --  12.9  --  12.9  --   HCT  --  40.5  --  40.2  --   PLT  --  147*  --  137*  --   LABPROT  --   --  19.2*  --  18.8*  INR 1.7  --  1.60*  --  1.55*  CREATININE  --  1.04  --  1.02  --   CKTOTAL  --   --   --  81  --   TROPONINI  --   --  <0.03  --   --     Estimated Creatinine Clearance: 37.1 mL/min (by C-G formula based on Cr of 1.02).   Medical History: Past Medical History  Diagnosis Date  . Esophageal stricture   . Hypertension   . Persistent atrial fibrillation   . Cerebrovascular disease, unspecified   . Hyperlipidemia   . Unspecified disorder of kidney and ureter   . Renal cancer   . Generalized anxiety disorder   . Hiatal hernia   . PN (peripheral neuropathy)     in feet  . Rectal fissure   . Gastric ulcer   . IBS (irritable bowel syndrome)   . Insomnia   . Adenomatous colon polyp 2013  . Diverticulosis   . Skin cancer of nose   . Hx of cardiovascular stress test     Lexiscan Myoview (07/2013): No ischemia, EF 69%, normal study  . PONV (postoperative nausea and vomiting)   . Heart murmur   . Chronic bronchitis     "most q yr"   . GERD (gastroesophageal reflux disease)     "not bothered w/it lately" (01/11/2014)  . DJD  (degenerative joint disease)   . Osteoarthrosis, unspecified whether generalized or localized, unspecified site   . Chronic lower back pain   . Ankle fracture, right     treated conservatively, non-displaced     Medications:  Prescriptions prior to admission  Medication Sig Dispense Refill Last Dose  . acetaminophen (TYLENOL) 500 MG tablet Take 1,000 mg by mouth every 6 (six) hours as needed for mild pain or headache.   Past Month at Unknown time  . amiodarone (PACERONE) 200 MG tablet TAKE 1/2 TABLET DAILY. 45 tablet 0 01/27/2015 at Unknown time  . amLODipine (NORVASC) 5 MG tablet Take 2.5 mg by mouth daily.   01/27/2015 at Unknown time  . diltiazem (CARDIZEM CD) 120 MG 24 hr capsule Take 1 capsule (120 mg total) by mouth daily. 90 capsule 3 01/27/2015 at Unknown time  . donepezil (ARICEPT) 10 MG tablet Take 10 mg by mouth at bedtime.   01/26/2015 at Unknown time  . fluticasone (FLONASE) 50 MCG/ACT nasal spray Place 1 spray into  both nostrils daily as needed for allergies or rhinitis.   Past Month at Unknown time  . gabapentin (NEURONTIN) 100 MG capsule Take 300 mg by mouth 2 (two) times daily.    01/27/2015 at Unknown time  . LORazepam (ATIVAN) 1 MG tablet Take 1 tablet (1 mg total) by mouth every 6 (six) hours as needed for anxiety or sleep. 6 tablet 0 01/27/2015 at Unknown time  . omega-3 acid ethyl esters (LOVAZA) 1 G capsule Take 1 g by mouth daily.   01/27/2015 at Unknown time  . Polyethyl Glycol-Propyl Glycol (SYSTANE ULTRA OP) Place 1 drop into both eyes daily as needed (dry eyes).   Past Week at Unknown time  . traZODone (DESYREL) 50 MG tablet Take 50 mg by mouth at bedtime as needed for sleep.   Past Week at Unknown time  . warfarin (COUMADIN) 5 MG tablet Take 5-7.5 mg by mouth daily. Take 1 tablet all days except Thursday take 1.5 tablets   01/26/2015 at Unknown time  . benzonatate (TESSALON) 100 MG capsule Take 100 mg by mouth 3 (three) times daily as needed for cough.   Taking  .  Cholecalciferol (VITAMIN D PO) Take 1 capsule by mouth daily.   Taking  . ciprofloxacin (CIPRO) 250 MG tablet Take 1 tablet by mouth 2 (two) times daily.   Taking  . folic acid (FOLVITE) 1 MG tablet Take 1 mg by mouth daily.   Unknown  . Multiple Vitamin (MULTIVITAMIN WITH MINERALS) TABS tablet Take 1 tablet by mouth daily.   Taking  . sodium chloride (OCEAN) 0.65 % SOLN nasal spray Place 1 spray into both nostrils 4 (four) times daily as needed for congestion.   unknown at unknown   Scheduled:  . amiodarone  100 mg Oral Daily  . amLODipine  2.5 mg Oral Daily  . diltiazem  120 mg Oral Daily  . donepezil  10 mg Oral QHS  . folic acid  1 mg Oral Daily  . gabapentin  100 mg Oral BID  . multivitamin with minerals  1 tablet Oral Daily  . omega-3 acid ethyl esters  1 g Oral Daily  . sodium chloride  3 mL Intravenous Q12H  . Warfarin - Pharmacist Dosing Inpatient   Does not apply q1800   Assessment: 14 yoF c/o generalized weakness/dyspnea on exertion x 2 weeks. Hx of Afib on Amiodarone and chronic Warfarin: home dose 5mg  daily except 7.5mg  Thursday. Admit INR 4/29 was 1.6, last dose Warfarin 4/28.   INR was subtherapeutic on admit, 7.5mg  Warfarin given 4/30 am  CBC low, stable  Amiodarone, Amlodipine & Diltiazem PTA continued  INR 1.55 this am  Goal of Therapy:  INR 2-3   Plan:   Repeat Warfarin 7.5mg  this am  Daily PT/INR  Minda Ditto PharmD Pager [phone] 01/29/2015, 7:40 AM

## 2015-01-29 NOTE — Progress Notes (Signed)
Physical Therapy Treatment Patient Details Name: RINDY KOLLMAN MRN: [id number] DOB: 03-06-1924 Today's Date: 01/29/2015    History of Present Illness pt admit with complaints of being very weak over last few weeks, outside of her normal active ability.     PT Comments    Pt reports she is going to rehab tomorrow at [facility]. Pt lives on campus there in her own Independent living apartment.  Follow Up Recommendations  SNF     Equipment Recommendations  None recommended by PT    Recommendations for Other Services       Precautions / Restrictions Precautions Precautions: Fall    Mobility  Bed Mobility Overal bed mobility: Needs Assistance Bed Mobility: Supine to Sit     Supine to sit: Supervision     General bed mobility comments: S to EOB with use of rail  Transfers Overall transfer level: Needs assistance Equipment used: Rolling walker (2 wheeled) Transfers: Sit to/from Stand Sit to Stand: Min assist         General transfer comment: MIN A for steadying  Ambulation/Gait Ambulation/Gait assistance: Min guard;Min assist Ambulation Distance (Feet): 80 Feet Assistive device: Rolling walker (2 wheeled) Gait Pattern/deviations: Step-through pattern     General Gait Details: Pt fatigued with gait and 1 episode of LOB near end of gait with turning. Pt states she likes rollator better than RW.   Stairs            Wheelchair Mobility    Modified Rankin (Stroke Patients Only)       Balance Overall balance assessment: Needs assistance           Standing balance-Leahy Scale: Fair                      Cognition Arousal/Alertness: Awake/alert Behavior During Therapy: WFL for tasks assessed/performed Overall Cognitive Status: Within Functional Limits for tasks assessed                      Exercises General Exercises - Lower Extremity Ankle Circles/Pumps: AROM;Both;10 reps;Seated Long Arc Quad: Strengthening;Both;10  reps;Seated Hip ABduction/ADduction: Strengthening;Both;10 reps;Seated Hip Flexion/Marching: Strengthening;Both;10 reps;Seated    General Comments General comments (skin integrity, edema, etc.): no dizziness today.      Pertinent Vitals/Pain Pain Assessment: No/denies pain    Home Living                      Prior Function            PT Goals (current goals can now be found in the care plan section) Acute Rehab PT Goals Patient Stated Goal: I want to get my to my normal self PT Goal Formulation: With patient Time For Goal Achievement: 02/06/15 Potential to Achieve Goals: Good Progress towards PT goals: Progressing toward goals    Frequency  Min 3X/week    PT Plan Current plan remains appropriate    Co-evaluation             End of Session Equipment Utilized During Treatment: Gait belt Activity Tolerance: Patient tolerated treatment well Patient left: in chair;with chair alarm set     Time: 1017-1030 PT Time Calculation (min) (ACUTE ONLY): 13 min  Charges:  $Gait Training: 8-22 mins                    G Codes:      Evangelia Whitaker LUBECK 01/29/2015, 11:01 AM

## 2015-01-30 DIAGNOSIS — F0390 Unspecified dementia without behavioral disturbance: Secondary | ICD-10-CM | POA: Diagnosis present

## 2015-01-30 DIAGNOSIS — E032 Hypothyroidism due to medicaments and other exogenous substances: Secondary | ICD-10-CM

## 2015-01-30 DIAGNOSIS — R531 Weakness: Secondary | ICD-10-CM | POA: Diagnosis not present

## 2015-01-30 DIAGNOSIS — I10 Essential (primary) hypertension: Secondary | ICD-10-CM | POA: Diagnosis not present

## 2015-01-30 DIAGNOSIS — E039 Hypothyroidism, unspecified: Secondary | ICD-10-CM | POA: Diagnosis present

## 2015-01-30 LAB — PROTIME-INR
INR: 1.79 — AB (ref 0.00–1.49)
PROTHROMBIN TIME: 20.9 s — AB (ref 11.6–15.2)

## 2015-01-30 IMAGING — CR DG HIP COMPLETE 2+V*R*
2 series · 2 of 2 positions shown · non-contrast
Comparison: None.

CLINICAL DATA: Right hip pain post fall 2 weeks ago

EXAM:
RIGHT HIP - COMPLETE 2+ VIEW

[t hip ap right]
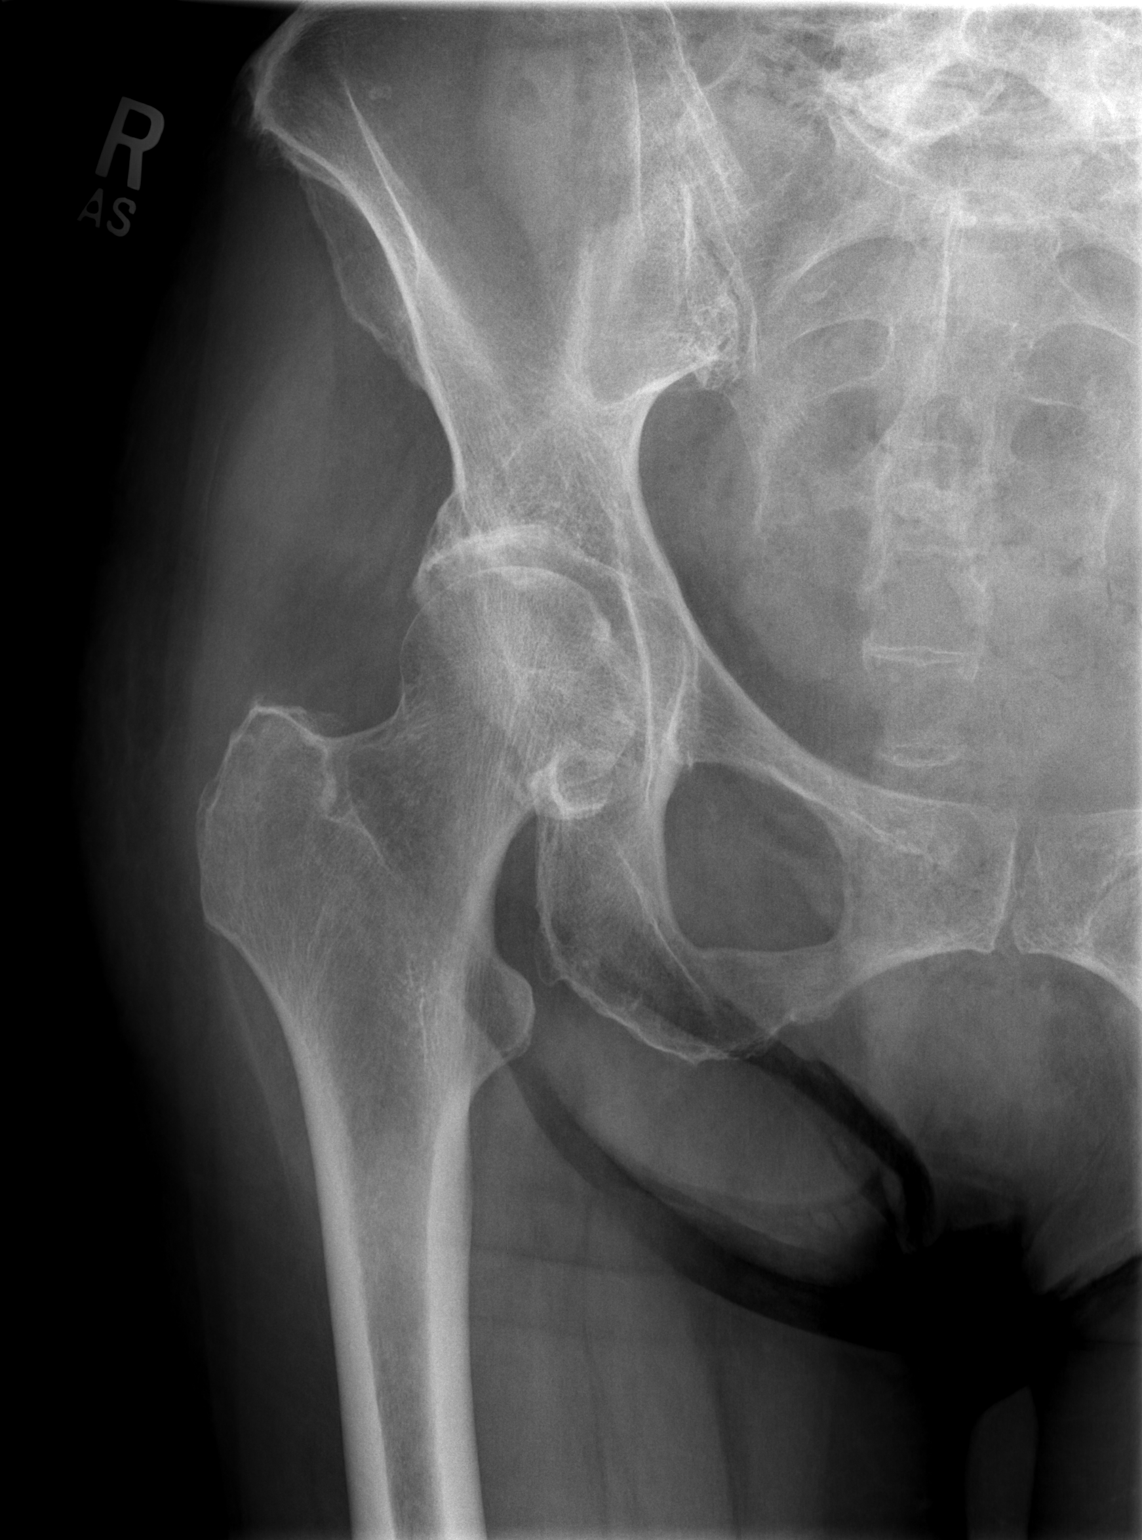

[t hip frog leg right]
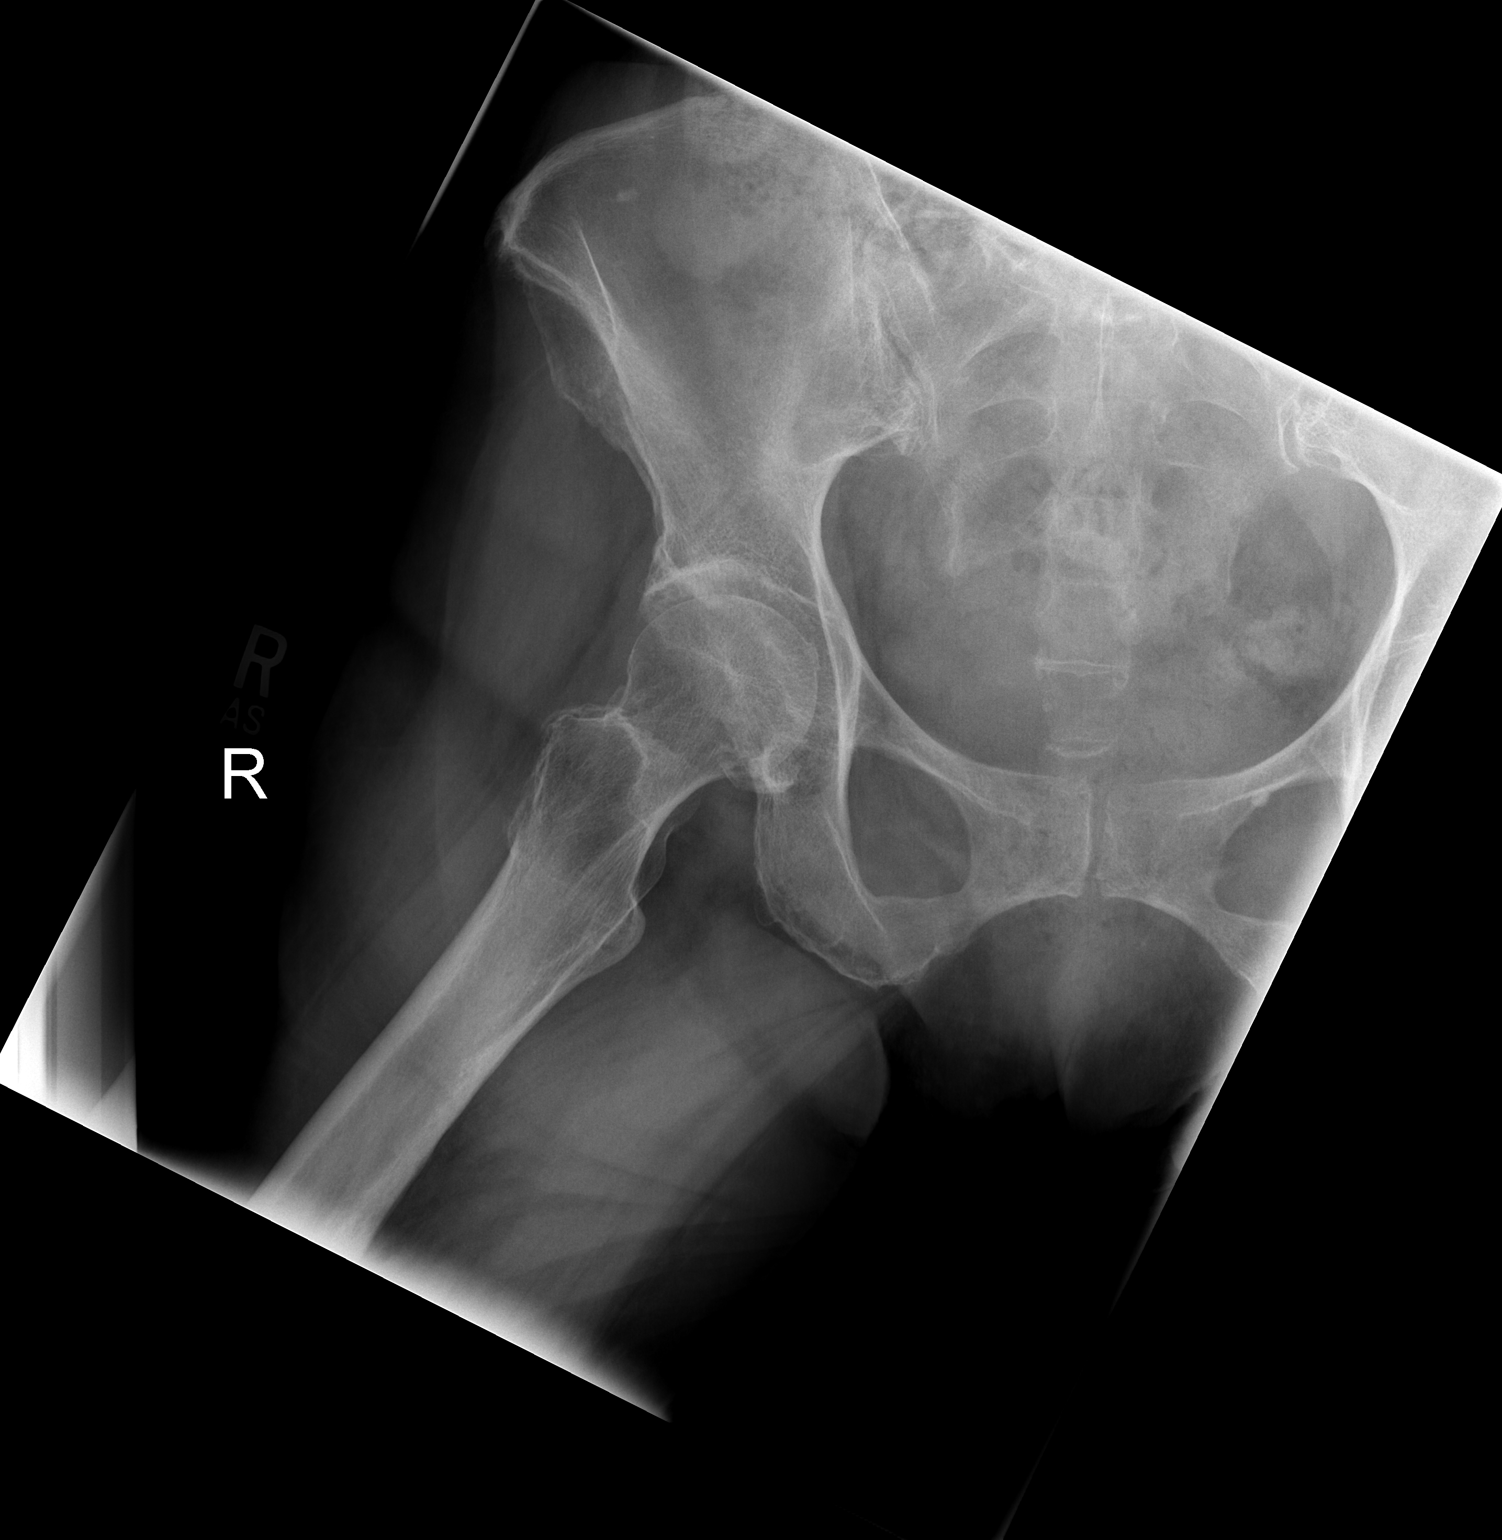

[2 of 2 positions shown; findings below may reference images not displayed]

FINDINGS: Two views of the right hip submitted. No acute fracture or
subluxation. Diffuse osteopenia. Mild degenerative changes pubic
symphysis. Mild spurring of greater femoral trochanter.
IMPRESSION: No acute fracture or subluxation. Diffuse osteopenia. Mild
degenerative changes.

## 2015-01-30 MED ORDER — WARFARIN SODIUM 5 MG PO TABS: 7.5000 mg | ORAL_TABLET | Freq: Every day | ORAL | Status: AC

## 2015-01-30 MED ORDER — LEVOTHYROXINE SODIUM 25 MCG PO TABS: 25.0000 ug | ORAL_TABLET | Freq: Every day | ORAL | Status: AC

## 2015-01-30 MED ORDER — LORAZEPAM 1 MG PO TABS: 1.0000 mg | ORAL_TABLET | Freq: Four times a day (QID) | ORAL | Status: AC | PRN

## 2015-01-30 NOTE — Discharge Summary (Addendum)
Physician Discharge Summary  Elizibeth [place]au Lac QJJ:[id number] DOB: 05-12-1924 DOA: 01/27/2015  PCP: Mathews Argyle, MD  Admit date: 01/27/2015 Discharge date: 01/30/2015  Time spent:<30 minutes  Recommendations for Outpatient Follow-up:  1. Discharge to [facility] home skilled nursing facility. Please follow-up with free T3 and T4 ordered on 5/2. Needs repeat TSH in 6 -8 weeks.  Discharge Diagnoses:  Principal Problem:   Generalized weakness  Active Problems:  Hypothyroidism   Essential hypertension   Chronic atrial fibrillation   Acute encephalopathy   Dementia   Discharge Condition: Fair  Diet recommendation: Heart healthy  CODE STATUS: Full code  [facility] Weights   01/28/15 0207  Weight: 66.8 kg (147 lb 4.3 oz)    History of present illness:  Please refer to admission H&P for details, in brief,81 y.o. female with past medical history of atrial fibrillation (on coumadin and amiodarone), hypertension who presented to [facility] ED with generalized weakness over past few weeks prior to this admission associated with difficulty getting around and performing ADL's. No reports of dizziness or lightheadedness or falls.   On admission, pt was hemodynamically stable. CXR and UA were unremarkable. Patient admitted on observation for further evaluation and workup.   Hospital Course:   Principal Problem:  Generalized weakness possibly related to hypothyroidism -No signs of infection. TSH ordered on admission was elevated to 9.4. Free T3 and T4 ordered. Hypothyroidism likely caused by amiodarone. I have started her on Synthroid 25 g daily. Need to follow free T3 and T4 levels and repeat TSH in 6-8 weeks. Seen by physical therapy and recommended skilled nursing facility placement.   Active Problems:  Acute encephalopathy / Dementia - MRI brain showed chronic small vessel ischemic changes but other interpretation mentions white matter signal in right temporal lobe ? Herpes encephalitis or  neoplasm. Pt has no fever and normal WBC count. Her mental status appears to be at baseline at this time. Continue aricept. -Patient also on Neurontin for? Peripheral neuropathy and Ativan when necessary for anxiety. i have reduced her ativan to 1 mg q 8 hr prn.   Essential hypertension - BP stable - Continue Norvasc, Cardizem    Dyslipidemia - Continue omega 3.   Chronic atrial fibrillation -Continue home dose amiodarone. INR subtherapeutic and warfarin dose increased to 7.5 mg daily upon discharge. Follow-up as outpatient.   Disposition: Skilled nursing facility  Family medical medication: None at bedside  Discharge Exam: Filed Vitals:   01/30/15 0900  BP: 109/50  Pulse: 66  Temp: 97.7 F (36.5 C)  Resp: 20    General: Elderly female in no acute distress HEENT: No pallor, moist oral mucosa, supple neck Chest: Clear to auscultation bilaterally, no added sounds CVS: S1 and S2 irregularly irregular, no murmurs rub or gallop GI: Soft, nondistended, nontender, bowel sounds present Musculoskeletal: Warm, no edema CNS: Alert and oriented x 2    Discharge Instructions    Current Discharge Medication List    CONTINUE these medications which have CHANGED   Details  LORazepam (ATIVAN) 1 MG tablet Take 1 tablet (1 mg total) by mouth every 8 (six) hours as needed for anxiety or sleep. Qty: 10 tablet, Refills: 0    warfarin (COUMADIN) 5 MG tablet Take 1.5 tablets (7.5 mg total) by mouth daily at 6 PM. Take 1 tablet all days except Thursday take 1.5 tablets Qty: 10 tablet, Refills: 0      CONTINUE these medications which have NOT CHANGED   Details  acetaminophen (TYLENOL) 500 MG tablet Take  1,000 mg by mouth every 6 (six) hours as needed for mild pain or headache.    amiodarone (PACERONE) 200 MG tablet TAKE 1/2 TABLET DAILY. Qty: 45 tablet, Refills: 0    amLODipine (NORVASC) 5 MG tablet Take 2.5 mg by mouth daily.    diltiazem (CARDIZEM CD) 120 MG 24 hr capsule  Take 1 capsule (120 mg total) by mouth daily. Qty: 90 capsule, Refills: 3    donepezil (ARICEPT) 10 MG tablet Take 10 mg by mouth at bedtime.    fluticasone (FLONASE) 50 MCG/ACT nasal spray Place 1 spray into both nostrils daily as needed for allergies or rhinitis.    gabapentin (NEURONTIN) 100 MG capsule Take 300 mg by mouth 2 (two) times daily.     omega-3 acid ethyl esters (LOVAZA) 1 G capsule Take 1 g by mouth daily.    Polyethyl Glycol-Propyl Glycol (SYSTANE ULTRA OP) Place 1 drop into both eyes daily as needed (dry eyes).    traZODone (DESYREL) 50 MG tablet Take 50 mg by mouth at bedtime as needed for sleep.    Cholecalciferol (VITAMIN D PO) Take 1 capsule by mouth daily.    sodium chloride (OCEAN) 0.65 % SOLN nasal spray Place 1 spray into both nostrils 4 (four) times daily as needed for congestion.      STOP taking these medications     benzonatate (TESSALON) 100 MG capsule      ciprofloxacin (CIPRO) 250 MG tablet      folic acid (FOLVITE) 1 MG tablet      Multiple Vitamin (MULTIVITAMIN WITH MINERALS) TABS tablet        Allergies  Allergen Reactions  . Other Nausea And Vomiting    Any kind of BELL PEPPER.   Extreme, violent nausea and vomiting quickly leading to dehydration  . Codeine Nausea And Vomiting       . Penicillins Nausea And Vomiting  . Sulfa Antibiotics Itching   Follow-up Information    Please follow up.   Why:  MD at SNF       The results of significant diagnostics from this hospitalization (including imaging, microbiology, ancillary and laboratory) are listed below for reference.    Significant Diagnostic Studies: Mr Brain Wo Contrast  01/28/2015   CLINICAL DATA:  Generalized weakness, developing over the last 3 weeks. Atrial fibrillation.  EXAM: MRI HEAD WITHOUT CONTRAST  TECHNIQUE: Multiplanar, multiecho pulse sequences of the brain and surrounding structures were obtained without intravenous contrast.  COMPARISON:  01/13/2014  FINDINGS:  Diffusion imaging does not show any acute or subacute infarction. There chronic small-vessel ischemic changes affecting the pons. No focal cerebellar insult. The cerebral hemispheres show a fairly typical degree of generalized brain atrophy for age, with moderate chronic small-vessel ischemic changes within the hemispheric white matter. A few of the old white matter infarctions are associated with hemosiderin deposition. No cortical or large vessel territory infarction. When compared to the previous study, there does seem to be notable progression of white matter signal in the right temporal lobe. There is no restricted diffusion in that area and there does not appear to be cortical insult. It is possible that interval localize white matter infarctions in this region could explain some of the clinical presentation. The pattern could be consistent with other temporal lobe processes including early manifestations of infiltrating neoplasm and very early manifestations of herpes infection. Those are felt to be a unlikely, but if clinical worsening occurs, reimaging should be considered for specific evaluation of this area.  No obstructive hydrocephalus. No extra-axial collection. No pituitary mass. No inflammatory sinus disease. No skull or skullbase lesion.  IMPRESSION: Atrophy and chronic small vessel ischemic changes. No sign of acute or subacute infarction or other cause of restricted diffusion.  Since the study of 01/13/2014, there is focally more abnormal white matter signal in the right temporal lobe. This could be a region that has been affected by interval white matter infarctions. Theoretically, consideration is given to the possibility of an infiltrating white matter neoplasm or a very early manifestation of herpes encephalitis. I cannot specifically establish either of those diagnoses. Should there be clinical worsening, re- imaging might be useful.   Electronically Signed   By: Nelson Chimes M.D.   On:  01/28/2015 15:42   Dg Chest Port 1 View  01/27/2015   CLINICAL DATA:  Weakness. Hypertension. Chronic bronchitis. Ex-smoker.  EXAM: PORTABLE CHEST - 1 VIEW  COMPARISON:  11/29/2013  FINDINGS: Midline trachea. Cardiomegaly accentuated by AP portable technique. Atherosclerosis in the transverse aorta. No pleural effusion or pneumothorax. Pulmonary interstitial thickening is moderate, and accentuated by AP portable technique. No lobar consolidation. Mild scarring at the left lung base.  IMPRESSION: COPD/chronic bronchitis, without superimposed acute finding.  Mild cardiomegaly with aortic atherosclerosis.   Electronically Signed   By: Abigail Miyamoto M.D.   On: 01/27/2015 22:11    Microbiology: Recent Results (from the past 240 hour(s))  Urine culture     Status: None   Collection Time: 01/27/15  9:01 PM  Result Value Ref Range Status   Specimen Description URINE, CLEAN CATCH  Final   Special Requests NONE  Final   Colony Count   Final    20,OOO COLONIES/ML Performed at [facility]    Culture   Final    Multiple bacterial morphotypes present, none predominant. Suggest appropriate recollection if clinically indicated. Performed at [facility]    Report Status 01/29/2015 FINAL  Final     Labs: Basic Metabolic Panel:  Recent Labs Lab 01/27/15 2109 01/28/15 0558  NA 140 144  K 4.4 4.3  CL 108 108  CO2 26 28  GLUCOSE 112* 90  BUN 19 17  CREATININE 1.04 1.02  CALCIUM 9.3 9.1   Liver Function Tests:  Recent Labs Lab 01/27/15 2109 01/28/15 0558  AST 26 22  ALT 20 20  ALKPHOS 46 43  BILITOT 0.5 0.8  PROT 6.8 6.6  ALBUMIN 3.8 3.5   No results for input(s): LIPASE, AMYLASE in the last 168 hours. No results for input(s): AMMONIA in the last 168 hours. CBC:  Recent Labs Lab 01/27/15 2109 01/28/15 0558  WBC 6.2 6.4  NEUTROABS  --  3.7  HGB 12.9 12.9  HCT 40.5 40.2  MCV 96.9 96.2  PLT 147* 137*   Cardiac Enzymes:  Recent Labs Lab 01/27/15 2320  01/28/15 0558  CKTOTAL  --  81  TROPONINI <0.03  --    BNP: BNP (last 3 results)  Recent Labs  01/27/15 2108  BNP 86.2    ProBNP (last 3 results) No results for input(s): PROBNP in the last 8760 hours.  CBG:  Recent Labs Lab 01/27/15 2104  GLUCAP 99       Signed:  Purva Vessell  [facility] 01/30/2015, 10:11 AM

## 2015-01-30 NOTE — Progress Notes (Signed)
Spoke with pt's daughter Idelia Salm by phone [phone] to explain that pt did not meet in pt status. Ms. Phill Myron was not pleasant, she did not understand guidelines and care of pt did not meet criteria for in pt status according Medicare guidelines. Pt was admitted as observation.

## 2015-01-30 NOTE — Clinical Social Work Placement (Signed)
Patient is set to discharge to [facility] today. Patient & daughter, Jeannene Patella Apple aware. Discharge packet given to RN, Susie. [place] transportation to pickup at [facility], [place] Social Worker cell #: [phone]    CLINICAL SOCIAL WORK PLACEMENT  NOTE  Date:  01/30/2015  Patient Details  Name: Anne Hayes MRN: [id number] Date of Birth: 1924/08/14  Clinical Social Work is seeking post-discharge placement for this patient at the [place] level of care (*CSW will initial, date and re-position this form in  chart as items are completed):  Yes   Patient/family provided with [place] Work Department's list of facilities offering this level of care within the geographic area requested by the patient (or if unable, by the patient's family).  Yes   Patient/family informed of their freedom to choose among providers that offer the needed level of care, that participate in Medicare, Medicaid or managed care program needed by the patient, have an available bed and are willing to accept the patient.  Yes   Patient/family informed of [facility]'s ownership interest in [facility] and [facility], as well as of the fact that they are under no obligation to receive care at these facilities.  PASRR submitted to EDS on 01/29/15     PASRR number received on 01/29/15     Existing PASRR number confirmed on       FL2 transmitted to all facilities in geographic area requested by pt/family on 01/29/15     FL2 transmitted to all facilities within larger geographic area on       Patient informed that his/her managed care company has contracts with or will negotiate with certain facilities, including the following:        Yes   Patient/family informed of bed offers received.  Patient chooses bed at [facility]     Physician recommends and patient chooses bed at      Patient to be transferred  to [facility] on 01/30/15.  Patient to be transferred to facility by [facility] transportation to pickup at 11am     Patient family notified on 01/30/15 of transfer.  Name of family member notified:  patient's daughter, Jeannene Patella Apple aware via phone     PHYSICIAN       Additional Comment:    _______________________________________________ Standley Brooking, LCSW 01/30/2015, 10:59 AM

## 2015-02-03 ENCOUNTER — Ambulatory Visit (INDEPENDENT_AMBULATORY_CARE_PROVIDER_SITE_OTHER): Payer: Medicare Other | Admitting: *Deleted

## 2015-02-03 DIAGNOSIS — Z7901 Long term (current) use of anticoagulants: Secondary | ICD-10-CM

## 2015-02-03 DIAGNOSIS — I4891 Unspecified atrial fibrillation: Secondary | ICD-10-CM | POA: Diagnosis not present

## 2015-02-03 DIAGNOSIS — Z5181 Encounter for therapeutic drug level monitoring: Secondary | ICD-10-CM

## 2015-02-03 LAB — POCT INR: INR: 2.7

## 2015-02-07 ENCOUNTER — Encounter: Payer: Self-pay | Admitting: Internal Medicine

## 2015-02-22 ENCOUNTER — Ambulatory Visit (INDEPENDENT_AMBULATORY_CARE_PROVIDER_SITE_OTHER): Payer: Medicare Other | Admitting: *Deleted

## 2015-02-22 DIAGNOSIS — Z7901 Long term (current) use of anticoagulants: Secondary | ICD-10-CM | POA: Diagnosis not present

## 2015-02-22 DIAGNOSIS — I4891 Unspecified atrial fibrillation: Secondary | ICD-10-CM

## 2015-02-22 DIAGNOSIS — Z5181 Encounter for therapeutic drug level monitoring: Secondary | ICD-10-CM | POA: Diagnosis not present

## 2015-02-22 LAB — POCT INR: INR: 5.3

## 2015-03-03 ENCOUNTER — Ambulatory Visit (INDEPENDENT_AMBULATORY_CARE_PROVIDER_SITE_OTHER): Payer: Medicare Other | Admitting: *Deleted

## 2015-03-03 DIAGNOSIS — I4891 Unspecified atrial fibrillation: Secondary | ICD-10-CM

## 2015-03-03 DIAGNOSIS — Z5181 Encounter for therapeutic drug level monitoring: Secondary | ICD-10-CM

## 2015-03-03 DIAGNOSIS — Z7901 Long term (current) use of anticoagulants: Secondary | ICD-10-CM

## 2015-03-03 LAB — POCT INR: INR: 2.4

## 2015-03-14 ENCOUNTER — Emergency Department (HOSPITAL_COMMUNITY): Payer: Medicare Other

## 2015-03-14 ENCOUNTER — Inpatient Hospital Stay (HOSPITAL_COMMUNITY)
Admission: EM | Admit: 2015-03-14 | Discharge: 2015-03-31 | DRG: 064 | Disposition: E | Payer: Medicare Other | Attending: Neurology | Admitting: Neurology

## 2015-03-14 ENCOUNTER — Other Ambulatory Visit (HOSPITAL_COMMUNITY): Payer: Self-pay

## 2015-03-14 ENCOUNTER — Encounter (HOSPITAL_COMMUNITY): Payer: Self-pay | Admitting: Emergency Medicine

## 2015-03-14 DIAGNOSIS — Z885 Allergy status to narcotic agent status: Secondary | ICD-10-CM

## 2015-03-14 DIAGNOSIS — T45515A Adverse effect of anticoagulants, initial encounter: Secondary | ICD-10-CM | POA: Diagnosis present

## 2015-03-14 DIAGNOSIS — K219 Gastro-esophageal reflux disease without esophagitis: Secondary | ICD-10-CM | POA: Diagnosis present

## 2015-03-14 DIAGNOSIS — Z515 Encounter for palliative care: Secondary | ICD-10-CM

## 2015-03-14 DIAGNOSIS — I481 Persistent atrial fibrillation: Secondary | ICD-10-CM | POA: Diagnosis present

## 2015-03-14 DIAGNOSIS — I619 Nontraumatic intracerebral hemorrhage, unspecified: Secondary | ICD-10-CM | POA: Diagnosis present

## 2015-03-14 DIAGNOSIS — Z85828 Personal history of other malignant neoplasm of skin: Secondary | ICD-10-CM

## 2015-03-14 DIAGNOSIS — G8194 Hemiplegia, unspecified affecting left nondominant side: Secondary | ICD-10-CM | POA: Diagnosis present

## 2015-03-14 DIAGNOSIS — Z880 Allergy status to penicillin: Secondary | ICD-10-CM | POA: Diagnosis not present

## 2015-03-14 DIAGNOSIS — J42 Unspecified chronic bronchitis: Secondary | ICD-10-CM | POA: Diagnosis present

## 2015-03-14 DIAGNOSIS — Z823 Family history of stroke: Secondary | ICD-10-CM

## 2015-03-14 DIAGNOSIS — R471 Dysarthria and anarthria: Secondary | ICD-10-CM | POA: Diagnosis present

## 2015-03-14 DIAGNOSIS — I4891 Unspecified atrial fibrillation: Secondary | ICD-10-CM

## 2015-03-14 DIAGNOSIS — R739 Hyperglycemia, unspecified: Secondary | ICD-10-CM | POA: Diagnosis present

## 2015-03-14 DIAGNOSIS — I610 Nontraumatic intracerebral hemorrhage in hemisphere, subcortical: Secondary | ICD-10-CM | POA: Diagnosis present

## 2015-03-14 DIAGNOSIS — Z79899 Other long term (current) drug therapy: Secondary | ICD-10-CM

## 2015-03-14 DIAGNOSIS — F411 Generalized anxiety disorder: Secondary | ICD-10-CM | POA: Diagnosis present

## 2015-03-14 DIAGNOSIS — I10 Essential (primary) hypertension: Secondary | ICD-10-CM | POA: Diagnosis present

## 2015-03-14 DIAGNOSIS — I629 Nontraumatic intracranial hemorrhage, unspecified: Secondary | ICD-10-CM

## 2015-03-14 DIAGNOSIS — Z791 Long term (current) use of non-steroidal anti-inflammatories (NSAID): Secondary | ICD-10-CM

## 2015-03-14 DIAGNOSIS — G629 Polyneuropathy, unspecified: Secondary | ICD-10-CM | POA: Diagnosis present

## 2015-03-14 DIAGNOSIS — I618 Other nontraumatic intracerebral hemorrhage: Secondary | ICD-10-CM | POA: Diagnosis not present

## 2015-03-14 DIAGNOSIS — R0902 Hypoxemia: Secondary | ICD-10-CM | POA: Insufficient documentation

## 2015-03-14 DIAGNOSIS — Z7901 Long term (current) use of anticoagulants: Secondary | ICD-10-CM

## 2015-03-14 DIAGNOSIS — Z87891 Personal history of nicotine dependence: Secondary | ICD-10-CM

## 2015-03-14 DIAGNOSIS — Z66 Do not resuscitate: Secondary | ICD-10-CM | POA: Diagnosis not present

## 2015-03-14 DIAGNOSIS — G936 Cerebral edema: Secondary | ICD-10-CM | POA: Diagnosis present

## 2015-03-14 DIAGNOSIS — E785 Hyperlipidemia, unspecified: Secondary | ICD-10-CM | POA: Diagnosis present

## 2015-03-14 DIAGNOSIS — D6832 Hemorrhagic disorder due to extrinsic circulating anticoagulants: Secondary | ICD-10-CM | POA: Diagnosis present

## 2015-03-14 DIAGNOSIS — R58 Hemorrhage, not elsewhere classified: Secondary | ICD-10-CM | POA: Diagnosis not present

## 2015-03-14 DIAGNOSIS — F0390 Unspecified dementia without behavioral disturbance: Secondary | ICD-10-CM | POA: Diagnosis present

## 2015-03-14 DIAGNOSIS — Z881 Allergy status to other antibiotic agents status: Secondary | ICD-10-CM | POA: Diagnosis not present

## 2015-03-14 DIAGNOSIS — I611 Nontraumatic intracerebral hemorrhage in hemisphere, cortical: Secondary | ICD-10-CM | POA: Diagnosis not present

## 2015-03-14 DIAGNOSIS — R0600 Dyspnea, unspecified: Secondary | ICD-10-CM | POA: Diagnosis present

## 2015-03-14 LAB — RAPID URINE DRUG SCREEN, HOSP PERFORMED
Amphetamines: NOT DETECTED
BENZODIAZEPINES: NOT DETECTED
Barbiturates: NOT DETECTED
Cocaine: NOT DETECTED
Opiates: NOT DETECTED
Tetrahydrocannabinol: NOT DETECTED

## 2015-03-14 LAB — URINE MICROSCOPIC-ADD ON

## 2015-03-14 LAB — DIFFERENTIAL
Basophils Absolute: 0 10*3/uL (ref 0.0–0.1)
Basophils Relative: 0 % (ref 0–1)
Eosinophils Absolute: 0.1 10*3/uL (ref 0.0–0.7)
Eosinophils Relative: 1 % (ref 0–5)
Lymphocytes Relative: 14 % (ref 12–46)
Lymphs Abs: 1.7 10*3/uL (ref 0.7–4.0)
MONO ABS: 0.7 10*3/uL (ref 0.1–1.0)
Monocytes Relative: 6 % (ref 3–12)
NEUTROS ABS: 9.6 10*3/uL — AB (ref 1.7–7.7)
Neutrophils Relative %: 79 % — ABNORMAL HIGH (ref 43–77)

## 2015-03-14 LAB — I-STAT CHEM 8, ED
BUN: 17 mg/dL (ref 6–20)
CALCIUM ION: 1.14 mmol/L (ref 1.13–1.30)
Chloride: 103 mmol/L (ref 101–111)
Creatinine, Ser: 0.8 mg/dL (ref 0.44–1.00)
GLUCOSE: 145 mg/dL — AB (ref 65–99)
HEMATOCRIT: 52 % — AB (ref 36.0–46.0)
HEMOGLOBIN: 17.7 g/dL — AB (ref 12.0–15.0)
Potassium: 3.9 mmol/L (ref 3.5–5.1)
Sodium: 138 mmol/L (ref 135–145)
TCO2: 20 mmol/L (ref 0–100)

## 2015-03-14 LAB — CBC
HCT: 46.6 % — ABNORMAL HIGH (ref 36.0–46.0)
HEMOGLOBIN: 15.7 g/dL — AB (ref 12.0–15.0)
MCH: 31.3 pg (ref 26.0–34.0)
MCHC: 33.7 g/dL (ref 30.0–36.0)
MCV: 93 fL (ref 78.0–100.0)
PLATELETS: 173 10*3/uL (ref 150–400)
RBC: 5.01 MIL/uL (ref 3.87–5.11)
RDW: 13.5 % (ref 11.5–15.5)
WBC: 12.1 10*3/uL — AB (ref 4.0–10.5)

## 2015-03-14 LAB — COMPREHENSIVE METABOLIC PANEL
ALT: 27 U/L (ref 14–54)
ANION GAP: 11 (ref 5–15)
AST: 35 U/L (ref 15–41)
Albumin: 4.2 g/dL (ref 3.5–5.0)
Alkaline Phosphatase: 54 U/L (ref 38–126)
BILIRUBIN TOTAL: 0.5 mg/dL (ref 0.3–1.2)
BUN: 14 mg/dL (ref 6–20)
CO2: 22 mmol/L (ref 22–32)
Calcium: 9.3 mg/dL (ref 8.9–10.3)
Chloride: 103 mmol/L (ref 101–111)
Creatinine, Ser: 0.91 mg/dL (ref 0.44–1.00)
GFR calc Af Amer: >60 mL/min (ref >60)
GFR calc non Af Amer: 55 mL/min — ABNORMAL LOW (ref >60)
Glucose, Bld: 144 mg/dL — ABNORMAL HIGH (ref 65–99)
Potassium: 3.9 mmol/L (ref 3.5–5.1)
Sodium: 136 mmol/L (ref 135–145)
Total Protein: 7.6 g/dL (ref 6.5–8.1)

## 2015-03-14 LAB — PROTIME-INR
INR: 2.88 — AB (ref 0.00–1.49)
INR: 1.2 (ref 0.00–1.49)
INR: 1.21 (ref 0.00–1.49)
INR: 1.14 (ref 0.00–1.49)
Prothrombin Time: 29.7 seconds — ABNORMAL HIGH (ref 11.6–15.2)
Prothrombin Time: 15.3 seconds — ABNORMAL HIGH (ref 11.6–15.2)
Prothrombin Time: 15.4 seconds — ABNORMAL HIGH (ref 11.6–15.2)
Prothrombin Time: 14.8 seconds (ref 11.6–15.2)

## 2015-03-14 LAB — I-STAT TROPONIN, ED: Troponin i, poc: 0 ng/mL (ref 0.00–0.08)

## 2015-03-14 LAB — ETHANOL

## 2015-03-14 LAB — URINALYSIS, ROUTINE W REFLEX MICROSCOPIC
Bilirubin Urine: NEGATIVE
Glucose, UA: NEGATIVE mg/dL
Ketones, ur: NEGATIVE mg/dL
Nitrite: POSITIVE — AB
PH: 7.5 (ref 5.0–8.0)
Protein, ur: 30 mg/dL — AB
Specific Gravity, Urine: 1.014 (ref 1.005–1.030)
Urobilinogen, UA: 0.2 mg/dL (ref 0.0–1.0)

## 2015-03-14 LAB — MRSA PCR SCREENING: MRSA by PCR: NEGATIVE

## 2015-03-14 LAB — APTT: aPTT: 42 seconds — ABNORMAL HIGH (ref 24–37)

## 2015-03-14 MED ORDER — DEXTROSE 5 % IV SOLN
10.0000 mg | INTRAVENOUS | Status: AC
  Administered 2015-03-14: 10 mg via INTRAVENOUS
  Filled 2015-03-14: qty 1

## 2015-03-14 MED ORDER — LABETALOL HCL 5 MG/ML IV SOLN: 10.0000 mg | INTRAVENOUS | Status: DC | PRN

## 2015-03-14 MED ORDER — DILTIAZEM HCL 100 MG IV SOLR
5.0000 mg/h | INTRAVENOUS | Status: DC
  Administered 2015-03-14: 15 mg/h via INTRAVENOUS
  Administered 2015-03-14: 5 mg/h via INTRAVENOUS
  Administered 2015-03-15 (×2): 15 mg/h via INTRAVENOUS
  Filled 2015-03-14 (×4): qty 100

## 2015-03-14 MED ORDER — ONDANSETRON HCL 4 MG/2ML IJ SOLN
INTRAMUSCULAR | Status: AC
  Administered 2015-03-14: 10:00:00
  Filled 2015-03-14: qty 2

## 2015-03-14 MED ORDER — PANTOPRAZOLE SODIUM 40 MG IV SOLR
40.0000 mg | Freq: Every day | INTRAVENOUS | Status: DC
  Administered 2015-03-14: 40 mg via INTRAVENOUS
  Filled 2015-03-14 (×2): qty 40

## 2015-03-14 MED ORDER — ACETAMINOPHEN 325 MG PO TABS: 650.0000 mg | ORAL_TABLET | ORAL | Status: DC | PRN

## 2015-03-14 MED ORDER — ONDANSETRON HCL 4 MG/2ML IJ SOLN
4.0000 mg | Freq: Once | INTRAMUSCULAR | Status: AC
  Administered 2015-03-14: 4 mg via INTRAVENOUS

## 2015-03-14 MED ORDER — ONDANSETRON HCL 4 MG/2ML IJ SOLN
4.0000 mg | Freq: Four times a day (QID) | INTRAMUSCULAR | Status: DC | PRN
  Administered 2015-03-14 – 2015-03-15 (×2): 4 mg via INTRAVENOUS
  Filled 2015-03-14 (×3): qty 2

## 2015-03-14 MED ORDER — SENNOSIDES-DOCUSATE SODIUM 8.6-50 MG PO TABS
1.0000 | ORAL_TABLET | Freq: Two times a day (BID) | ORAL | Status: DC
  Filled 2015-03-14 (×2): qty 1

## 2015-03-14 MED ORDER — ONDANSETRON HCL 4 MG/2ML IJ SOLN
4.0000 mg | Freq: Once | INTRAMUSCULAR | Status: AC
  Administered 2015-03-14: 4 mg via INTRAVENOUS
  Filled 2015-03-14: qty 2

## 2015-03-14 MED ORDER — STROKE: EARLY STAGES OF RECOVERY BOOK
Freq: Once | Status: AC
  Administered 2015-03-14: 11:00:00
  Filled 2015-03-14: qty 1

## 2015-03-14 MED ORDER — CETYLPYRIDINIUM CHLORIDE 0.05 % MT LIQD
7.0000 mL | Freq: Two times a day (BID) | OROMUCOSAL | Status: DC
  Administered 2015-03-14 – 2015-03-15 (×2): 7 mL via OROMUCOSAL

## 2015-03-14 MED ORDER — PROTHROMBIN COMPLEX CONC HUMAN 500 UNITS IV KIT
1551.0000 [IU] | PACK | Status: AC
  Administered 2015-03-14: 1551 [IU] via INTRAVENOUS
  Filled 2015-03-14: qty 62

## 2015-03-14 MED ORDER — ACETAMINOPHEN 650 MG RE SUPP
650.0000 mg | RECTAL | Status: DC | PRN
  Administered 2015-03-14 – 2015-03-17 (×4): 650 mg via RECTAL
  Filled 2015-03-14 (×5): qty 1

## 2015-03-14 MED ORDER — CETYLPYRIDINIUM CHLORIDE 0.05 % MT LIQD
7.0000 mL | Freq: Two times a day (BID) | OROMUCOSAL | Status: DC
  Administered 2015-03-14: 7 mL via OROMUCOSAL

## 2015-03-14 MED ORDER — FENTANYL CITRATE (PF) 100 MCG/2ML IJ SOLN
50.0000 ug | Freq: Once | INTRAMUSCULAR | Status: AC
  Administered 2015-03-14: 50 ug via INTRAVENOUS
  Filled 2015-03-14: qty 2

## 2015-03-14 MED ORDER — PROCHLORPERAZINE EDISYLATE 5 MG/ML IJ SOLN
10.0000 mg | Freq: Four times a day (QID) | INTRAMUSCULAR | Status: DC | PRN
  Administered 2015-03-14: 10 mg via INTRAVENOUS
  Filled 2015-03-14 (×2): qty 2

## 2015-03-14 NOTE — ED Notes (Signed)
Portable xray at bedside.

## 2015-03-14 NOTE — ED Notes (Signed)
Pharmacist has delivered [place] and vit K to bedside. Per Pharmacist give Anne Hayes first, followed by Vitamin K

## 2015-03-14 NOTE — ED Notes (Signed)
Neurology at bedside assessing pt. Pt began vomiting. Verbal order by Dr. Leonel Ramsay for 4 mg zofran obtained.

## 2015-03-14 NOTE — ED Notes (Signed)
After being placed on 6L nasal cannula pt O2 sats continuing to drop below 88%, placed pt on NRB at 15 L, sats at 96% at current time. Pt remains alert. Complaining of headache. States her daughter Jeannene Patella in [place] has her advance directive documents.

## 2015-03-14 NOTE — ED Notes (Signed)
Pt back from CT. Very drowsy at this time. Airway remains intact.

## 2015-03-14 NOTE — H&P (Addendum)
Neurology H&P  CC: headache  History is obtained from:patient  HPI: Anne Hayes is a 81 y.o. female who presents with ICH. She began having a severe headache last night and took tyelenol which "did not touch it." this morning, she was awoke with sevre headaceh and used her life alert to call for help and was brought to the ED via EMS.   Here, she was found to have a large subcortical hemorrhage with elevated INR due to coumadin.    LKW: 06/13 evening tpa given?: no, ICH    ROS: Unable to obtain as patient is actively vomiting, and does not want to speak due to nausea.   Past Medical History  Diagnosis Date  . Esophageal stricture   . Hypertension   . Persistent atrial fibrillation   . Cerebrovascular disease, unspecified   . Hyperlipidemia   . Unspecified disorder of kidney and ureter   . Renal cancer   . Generalized anxiety disorder   . Hiatal hernia   . PN (peripheral neuropathy)     in feet  . Rectal fissure   . Gastric ulcer   . IBS (irritable bowel syndrome)   . Insomnia   . Adenomatous colon polyp 2013  . Diverticulosis   . Skin cancer of nose   . Hx of cardiovascular stress test     Lexiscan Myoview (07/2013): No ischemia, EF 69%, normal study  . PONV (postoperative nausea and vomiting)   . Heart murmur   . Chronic bronchitis     "most q yr"   . GERD (gastroesophageal reflux disease)     "not bothered w/it lately" (01/11/2014)  . DJD (degenerative joint disease)   . Osteoarthrosis, unspecified whether generalized or localized, unspecified site   . Chronic lower back pain   . Ankle fracture, right     treated conservatively, non-displaced     Family History:  Unable to obtain as patient is actively vomiting, and does not want to speak due to nausea.   Social History: Lives in [place] home  Exam: Current vital signs: BP 149/94 mmHg  Pulse 92  Resp 14  SpO2 94% Vital signs in last 24 hours: Pulse Rate:  [77-102] 92 (06/14 0954) Resp:  [14-26]  14 (06/14 1000) BP: (115-150)/(82-97) 149/94 mmHg (06/14 1000) SpO2:  [94 %-99 %] 94 % (06/14 1000)  Physical Exam  Constitutional: Appears well-developed and well-nourished.  Psych: Affect appropriate to situation Eyes: No scleral injection HENT: No OP obstrucion Head: Normocephalic.  Cardiovascular: irregularly irregular Respiratory: Effort normal initially, significant upper airway sounds after emesis.  GI: Soft.   Skin: WDI  Neuro: Mental Status: Patient is drowsy but easily rousable. Able to answer questions readily prior to emesis, following emesis, she does not want   Cranial Nerves: II: She does not clearly blink to threat from the left. Pupils are equal, round, and reactive to light.   III,IV, VI: does not look to the left,  V: Facial sensation is symmetric to touch VII: Facial movement is mild left facial weakness VIII, X, XI, XII: Unable to assess secondary to patient's altered mental status.  Motor: Tone is normal. Bulk is normal. 5/5 strength was present on the right, 3/5 weakness of the left  arm, 4/5 in the left leg Sensory: ? Decrease on teh left, patient not very cooperative when testing.  Cerebellar: Pt does not comply.    I have reviewed labs in epic and the results pertinent to this consultation are: INR 2.8  I  have reviewed the images obtained:CT head - large right subcortical hemorrhage. Volume(A*B*C/2) =31 ml. There is also a remote area of hemorrhage near the initial one. There is hypoattenuation near these hemorrhages and I wonder about hemorrhageic infarct.   Impression: 81 yo F with coumadin related ICH. ICH score of two. I do wonder about hemorrhagic conversion of infarct given the multifocal nature. Right now, I would not pursue MRI given her nausea, but this will likely be needed.   Recommendations: 1) Admit to ICU 2) no antiplatelets or anticoagulants 3) blood pressure control with goal systolic <182 4) Frequent neuro checks 5) If symptoms  worsen or there is decreased mental status, repeat stat head CT. Routine repate tomorrow am.  6) PT,OT,ST 7) If stable and nausea controlled, MRI brain 8) will start diltiazem drip for afib with RVR as pt unable to take PO meds 9) odansetron for nausea, compazine if does not respond.    This patient is critically ill and at significant risk of neurological worsening, death and care requires constant monitoring of vital signs, hemodynamics,respiratory and cardiac monitoring, neurological assessment, discussion with family, other specialists and medical decision making of high complexity. I spent 90 minutes of neurocritical care time  in the care of  this patient.  Roland Rack, MD [facility] [phone]  If 7pm- 7am, please page neurology on call as listed in [place]. 03/02/2015  10:30 AM

## 2015-03-14 NOTE — ED Notes (Signed)
To ED from [facility] independent living, with c/o left sided weakness and slurred speech. Unknown time last seen normal. Lives alone-- used her life alert button. Upon EMS arrival- pt has slurred speech

## 2015-03-14 NOTE — ED Notes (Signed)
Pt transferred to CT, this RN to go to CT with pt.

## 2015-03-14 NOTE — ED Notes (Signed)
Pt actively vomiting at this time, sats from 96% to 86% on RA, placed on 4L, sats at 92%.

## 2015-03-14 NOTE — ED Notes (Signed)
Spoke with pt daughter Shana Chute is heading to hospital at this time, will take approximately 4 hours to get here from her home.

## 2015-03-14 NOTE — ED Notes (Signed)
Daughter in law Anne Hayes is at bedside at this time. Dr. Eulis Foster speaking with daughter in law.

## 2015-03-14 NOTE — ED Notes (Signed)
Pt sats continuing to drop below 90%, placed pt on 6L nasal cannula sats at 90%

## 2015-03-14 NOTE — ED Provider Notes (Signed)
CSN: [id number]     Arrival date & time 03/06/2015  0730 History   First MD Initiated Contact with Patient 03/24/2015 [phone]     Chief Complaint  Patient presents with  . Stroke Symptoms     (Consider location/radiation/quality/duration/timing/severity/associated sxs/prior Treatment) HPI  Anne Hayes is a 81 y.o. female who presents for evaluation of weakness and start speech. Patient activated her life alert this morning because she apparently felt poorly. She was found altered, with weakness, left arm start speech and difficulty swallowing. The  time of onset of symptoms is unknown as she was not seen since yesterday. She is unable to contribute to history, at this time.  Level V Caveat- altered mental status  Past Medical History  Diagnosis Date  . Esophageal stricture   . Hypertension   . Persistent atrial fibrillation   . Cerebrovascular disease, unspecified   . Hyperlipidemia   . Unspecified disorder of kidney and ureter   . Renal cancer   . Generalized anxiety disorder   . Hiatal hernia   . PN (peripheral neuropathy)     in feet  . Rectal fissure   . Gastric ulcer   . IBS (irritable bowel syndrome)   . Insomnia   . Adenomatous colon polyp 2013  . Diverticulosis   . Skin cancer of nose   . Hx of cardiovascular stress test     Lexiscan Myoview (07/2013): No ischemia, EF 69%, normal study  . PONV (postoperative nausea and vomiting)   . Heart murmur   . Chronic bronchitis     "most q yr"   . GERD (gastroesophageal reflux disease)     "not bothered w/it lately" (01/11/2014)  . DJD (degenerative joint disease)   . Osteoarthrosis, unspecified whether generalized or localized, unspecified site   . Chronic lower back pain   . Ankle fracture, right     treated conservatively, non-displaced    Past Surgical History  Procedure Laterality Date  . Appendectomy    . Cholecystectomy    . Bunionectomies  Bilateral   . Anterior and posterior vaginal repair  1989    AP REPAIR  & RAZ URETHROPEXY  . Renal cryoablation Right 07/2009    s/p percutaneous cryoablation of right renal cell cancer; by [name]  . Ablation  11/07/10    [facility] by Dr Rayann Heman  . Tonsillectomy    . Abdominal hysterectomy  1977    "partial"  . Cataract extraction w/ intraocular lens  implant, bilateral Bilateral   . Skin cancer excision      "nose; took skin from face and grafted area"   Family History  Problem Relation Age of Onset  . Heart disease Father   . Colon cancer Neg Hx   . Stroke Mother    History  Substance Use Topics  . Smoking status: Former Smoker -- 1.00 packs/day for 50 years    Types: Cigarettes    Quit date: 09/30/1988  . Smokeless tobacco: Never Used  . Alcohol Use: Yes     Comment: 01/11/2014 "glass of wine with dinner occasionally; seldom ever; <once/month"   OB History    No data available     Review of Systems  Unable to perform ROS     Allergies  Other; Codeine; Penicillins; and Sulfa antibiotics  Home Medications   Prior to Admission medications   Medication Sig Start Date End Date Taking? Authorizing Provider  acetaminophen (TYLENOL) 500 MG tablet Take 1,000 mg by mouth every 6 (six) hours as  needed for mild pain or headache.    Historical Provider, MD  amiodarone (PACERONE) 200 MG tablet TAKE 1/2 TABLET DAILY. 11/03/14   Thompson Grayer, MD  amLODipine (NORVASC) 5 MG tablet Take 2.5 mg by mouth daily.    Historical Provider, MD  Cholecalciferol (VITAMIN D PO) Take 1 capsule by mouth daily.    Historical Provider, MD  diltiazem (CARDIZEM CD) 120 MG 24 hr capsule Take 1 capsule (120 mg total) by mouth daily. 06/13/14   Thompson Grayer, MD  donepezil (ARICEPT) 10 MG tablet Take 10 mg by mouth at bedtime.    Historical Provider, MD  fluticasone (FLONASE) 50 MCG/ACT nasal spray Place 1 spray into both nostrils daily as needed for allergies or rhinitis.    Historical Provider, MD  gabapentin (NEURONTIN) 100 MG capsule Take 300 mg by mouth 2 (two) times daily.      Historical Provider, MD  levothyroxine (LEVOTHROID) 25 MCG tablet Take 1 tablet (25 mcg total) by mouth daily before breakfast. 01/30/15   Nishant Dhungel, MD  LORazepam (ATIVAN) 1 MG tablet Take 1 tablet (1 mg total) by mouth every 6 (six) hours as needed for anxiety or sleep. 01/30/15   Nishant Dhungel, MD  omega-3 acid ethyl esters (LOVAZA) 1 G capsule Take 1 g by mouth daily.    Historical Provider, MD  Polyethyl Glycol-Propyl Glycol (SYSTANE ULTRA OP) Place 1 drop into both eyes daily as needed (dry eyes).    Historical Provider, MD  sodium chloride (OCEAN) 0.65 % SOLN nasal spray Place 1 spray into both nostrils 4 (four) times daily as needed for congestion.    Historical Provider, MD  traZODone (DESYREL) 50 MG tablet Take 50 mg by mouth at bedtime as needed for sleep.    Historical Provider, MD  warfarin (COUMADIN) 5 MG tablet Take 1.5 tablets (7.5 mg total) by mouth daily at 6 PM. Take 1 tablet all days except Thursday take 1.5 tablets 01/30/15   Nishant Dhungel, MD   BP 150/97 mmHg  Pulse 102  Resp 21  SpO2 99% Physical Exam  Constitutional: She appears well-developed and well-nourished.  HENT:  Head: Normocephalic and atraumatic.  Right Ear: External ear normal.  Left Ear: External ear normal.  She is drooling from left-side of mouth.  Eyes: Conjunctivae and EOM are normal. Pupils are equal, round, and reactive to light.  Neck: Normal range of motion and phonation normal. Neck supple.  Cardiovascular: Normal rate, regular rhythm and normal heart sounds.   Pulmonary/Chest: Effort normal and breath sounds normal. She exhibits no bony tenderness.  Abdominal: Soft. There is no tenderness.  She vomited during the exam.  Musculoskeletal: Normal range of motion.  Neurological: She is alert. No sensory deficit. Coordination normal.  She is dysarthric and cannot answer specific questions. She does follow commands. Appears to be left facial weakness, and left arm as well as leg weakness. She  has a right gaze preference  Skin: Skin is warm, dry and intact.  Psychiatric: Her behavior is normal.  Nursing note and vitals reviewed.   ED Course  Procedures (including critical care time)  07:50- consideration for thrombolysis. Thrombolysis is not an option because time of onset of symptoms is unknown.  Nasal cannula, followed by facemask oxygen, given for abnormally low pulse oxygenation.   Medications  prothrombin complex conc human (KCENTRA) IVPB 1,551 Units (not administered)  phytonadione (VITAMIN K) 10 mg in dextrose 5 % 50 mL IVPB (not administered)  fentaNYL (SUBLIMAZE) injection 50 mcg (not administered)  ondansetron [facility]) injection 4 mg (4 mg Intravenous Given 03/12/2015 0751)    Patient Vitals for the past 24 hrs:  BP Pulse Resp SpO2  03/24/2015 0900 150/97 mmHg 102 21 99 %  03/05/2015 0845 139/84 mmHg - 20 96 %  03/24/2015 0830 132/82 mmHg - - 96 %  03/24/2015 0815 145/89 mmHg 97 16 95 %  03/24/2015 0742 115/94 mmHg 77 26 95 %  03/27/2015 0739 - - - 94 %   Radiologic imaging report reviewed and images by CT  - viewed, by me.  I discussed the case  Images, with the radiologist, Dr. Pollyann Kennedy.  9:10 AM Reevaluation with update and discussion. After initial assessment and treatment, an updated evaluation reveals she is more alert at this time, continues to have dysarthria, and states that she has a headache. Fentanyl ordered. Graycee Greeson L   09:12- case discussed with neuro- hospitalist.  The patient was reported to be full code, and last visit, but is reported to have "advanced directive". He will evaluate and admit the patient.  09:15- a  family member is here. I updated her on the findings.   CRITICAL CARE Performed by: Richarda Blade Total critical care time: 55 minutes Critical care time was exclusive of separately billable procedures and treating other patients. Critical care was necessary to treat or prevent imminent or life-threatening deterioration. Critical  care was time spent personally by me on the following activities: development of treatment plan with patient and/or surrogate as well as nursing, discussions with consultants, evaluation of patient's response to treatment, examination of patient, obtaining history from patient or surrogate, ordering and performing treatments and interventions, ordering and review of laboratory studies, ordering and review of radiographic studies, pulse oximetry and re-evaluation of patient's condition.   Labs Review Labs Reviewed  PROTIME-INR - Abnormal; Notable for the following:    Prothrombin Time 29.7 (*)    INR 2.88 (*)    All other components within normal limits  APTT - Abnormal; Notable for the following:    aPTT 42 (*)    All other components within normal limits  CBC - Abnormal; Notable for the following:    WBC 12.1 (*)    Hemoglobin 15.7 (*)    HCT 46.6 (*)    All other components within normal limits  DIFFERENTIAL - Abnormal; Notable for the following:    Neutrophils Relative % 79 (*)    Neutro Abs 9.6 (*)    All other components within normal limits  COMPREHENSIVE METABOLIC PANEL - Abnormal; Notable for the following:    Glucose, Bld 144 (*)    GFR calc non Af Amer 55 (*)    All other components within normal limits  URINALYSIS, ROUTINE W REFLEX MICROSCOPIC (NOT AT [facility]) - Abnormal; Notable for the following:    APPearance CLOUDY (*)    Hgb urine dipstick SMALL (*)    Protein, ur 30 (*)    Nitrite POSITIVE (*)    Leukocytes, UA MODERATE (*)    All other components within normal limits  URINE MICROSCOPIC-ADD ON - Abnormal; Notable for the following:    Bacteria, UA MANY (*)    All other components within normal limits  I-STAT CHEM 8, ED - Abnormal; Notable for the following:    Glucose, Bld 145 (*)    Hemoglobin 17.7 (*)    HCT 52.0 (*)    All other components within normal limits  ETHANOL  URINE RAPID DRUG SCREEN, [place], ED  Imaging Review Ct  Head Wo Contrast  03/05/2015   CLINICAL DATA:  Right-sided weakness and slurred speech beginning 03/13/2015. The patient is anticoagulated.  EXAM: CT HEAD WITHOUT CONTRAST  TECHNIQUE: Contiguous axial images were obtained from the base of the skull through the vertex without intravenous contrast.  COMPARISON:  Brain MRI 01/28/2015 and head CT scan 01/11/2014.  FINDINGS: A large hematoma is identified centered in the right temporal lobe and right basal ganglia. There is extensive hypoattenuation in the right temporal lobe. There is mild right to left midline shift at 0.3 cm there are likely punctate foci of hemorrhage in the left aspect of the pons. There is no hydrocephalus. The brain is atrophic with chronic microvascular ischemic change. The calvarium is intact.  IMPRESSION: Large intraparenchymal hematoma centered in the right temporal lobe and right basal ganglia with 0.3 cm right to left midline shift.  Likely punctate hemorrhage in the left pons.  Atrophy and chronic microvascular ischemic change.  Critical Value/emergent results were called by telephone at the time of interpretation on 03/24/2015 at 9:00 am to Dr. Daleen Bo , who verbally acknowledged these results.   Electronically Signed   By: Inge Rise M.D.   On: 03/22/2015 09:03   Dg Chest Portable 1 View  03/17/2015   CLINICAL DATA:  Possible stroke  EXAM: PORTABLE CHEST - 1 VIEW  COMPARISON:  01/27/2015  FINDINGS: Cardiomediastinal silhouette is stable. Atherosclerotic calcifications of thoracic aorta again noted. Stable chronic mild interstitial prominence and central bronchitic changes. Stable streaky left basilar scarring. No definite superimposed infiltrate or pulmonary edema.  IMPRESSION: Stable chronic mild interstitial prominence and central bronchitic changes. Stable streaky left basilar scarring. No definite superimposed infiltrate or pulmonary edema.   Electronically Signed   By: Lahoma Crocker M.D.   On: 03/04/2015 08:12     EKG  Interpretation None        Date: 03/21/2015  Rate: 98  Rhythm: atrial fibrillation  QRS Axis: normal  PR and QT Intervals: QT normal  ST/T Wave abnormalities: normal  PR and QRS Conduction Disutrbances:none  Narrative Interpretation:   Old EKG Reviewed: unchanged   MDM   Final diagnoses:  Intracranial hemorrhage  Hypoxia     Acute intracranial bleed, while anticoagulated, this is a very serious Emergent problem that will require ICU admission, and close observation and treatment.incidental hypoxia, is not clear, and may be related to decreased respiratory drive. Patient responded to oxygen by facemask. The extent of her last wishes, and CODE STATUS is not clear at the time of emergency department evaluation and treatment.   Nursing Notes Reviewed/ Care Coordinated, and agree without changes. Applicable Imaging Reviewed.  Interpretation of Laboratory Data incorporated into ED treatment  Plan: Admit   Daleen Bo, MD 03/15/15 [phone]

## 2015-03-15 ENCOUNTER — Inpatient Hospital Stay (HOSPITAL_COMMUNITY): Payer: Medicare Other

## 2015-03-15 ENCOUNTER — Encounter (HOSPITAL_COMMUNITY): Payer: Self-pay

## 2015-03-15 DIAGNOSIS — D688 Other specified coagulation defects: Secondary | ICD-10-CM

## 2015-03-15 DIAGNOSIS — R58 Hemorrhage, not elsewhere classified: Secondary | ICD-10-CM

## 2015-03-15 DIAGNOSIS — R0902 Hypoxemia: Secondary | ICD-10-CM | POA: Insufficient documentation

## 2015-03-15 DIAGNOSIS — I611 Nontraumatic intracerebral hemorrhage in hemisphere, cortical: Secondary | ICD-10-CM

## 2015-03-15 DIAGNOSIS — T45515A Adverse effect of anticoagulants, initial encounter: Secondary | ICD-10-CM

## 2015-03-15 DIAGNOSIS — I4891 Unspecified atrial fibrillation: Secondary | ICD-10-CM

## 2015-03-15 DIAGNOSIS — G936 Cerebral edema: Secondary | ICD-10-CM

## 2015-03-15 LAB — PROTIME-INR
INR: 1.17 (ref 0.00–1.49)
Prothrombin Time: 15.1 seconds (ref 11.6–15.2)

## 2015-03-15 MED ORDER — FENTANYL CITRATE (PF) 100 MCG/2ML IJ SOLN
12.5000 ug | INTRAMUSCULAR | Status: DC | PRN
  Administered 2015-03-15 – 2015-03-17 (×11): 12.5 ug via INTRAVENOUS
  Filled 2015-03-15 (×10): qty 2

## 2015-03-15 MED ORDER — FENTANYL CITRATE (PF) 100 MCG/2ML IJ SOLN
INTRAMUSCULAR | Status: AC
  Filled 2015-03-15: qty 2

## 2015-03-15 NOTE — Care Management Note (Signed)
Case Management Note  Patient Details  Name: Anne Hayes MRN: [id number] Date of Birth: [birth year]  Subjective/Objective:   Pt admitted on 03/28/2015 with large subcortical hemorrhage.  PTA, pt resided at independent living facility.                   Action/Plan: Appears family plans to offer comfort care; plan transfer to medical floor for comfort measures.    Expected Discharge Date:                  Expected Discharge Plan:  [place]  In-House Referral:     Discharge planning Services  CM Consult  Post Acute Care Choice:    Choice offered to:     DME Arranged:    DME Agency:     HH Arranged:    [place] Agency:     Status of Service:  In process, will continue to follow  Medicare Important Message Given:    Date Medicare IM Given:    Medicare IM give by:    Date Additional Medicare IM Given:    Additional Medicare Important Message give by:     If discussed at [place] of Stay Meetings, dates discussed:    Additional Comments:  Reinaldo Raddle, RN, BSN  Trauma/Neuro ICU Case Manager [phone]

## 2015-03-15 NOTE — Progress Notes (Signed)
Patient's family decided to proceed with comfort care measures.  All of their questions were answered and family is comfortable with this decision.  Spoke with Burnetta Sabin, NP and orders have been placed for comfort care.  Pt's living will is also in the chart.  Oxygen has been d/c'd and fentanyl has been given for comfort.  Palliative floor has been requested.  Will continue to provide support for patient and family.

## 2015-03-15 NOTE — Progress Notes (Signed)
PT Cancellation Note  Patient Details Name: [name] MRN: [id number] DOB: Aug 29, 1924   Cancelled Treatment:     Pt has now moved to comfort care only.  Will sign off at this time. 03/15/2015  Donnella Sham, [place] [phone]  (pager)   Britten Parady, Tessie Fass 03/15/2015, 4:28 PM

## 2015-03-15 NOTE — Progress Notes (Signed)
Family has decided on comfort care. Current medical treatment and care adjusted to reflect comfort care. Transfer to floor for ongoing palliative care. Dr. Leonie Man has been notified.  [place] for Pager information 03/15/2015 2:51 PM  I agree with the above Antony Contras, MD

## 2015-03-15 NOTE — Progress Notes (Signed)
STROKE TEAM PROGRESS NOTE   HISTORY Anne Hayes is a 81 y.o. female who presents with ICH. She began having a severe headache last night and took tyelenol which "did not touch it." this morning, she was awoke with severe headache and used her life alert to call for help and was brought to the ED via EMS.  Here, she was found to have a large subcortical hemorrhage with elevated INR due to coumadin. She was last known well 03/13/2015 in the evening. Patient was not administered TPA secondary to [place]. She was admitted to the neuro ICU for further evaluation and treatment.   SUBJECTIVE (INTERVAL HISTORY) Patient has remained drowsy and progressively sleepier overnight. Blood pressure has been adequately controlled.   OBJECTIVE Temp:  [97 F (36.1 C)-101 F (38.3 C)] 99.7 F (37.6 C) (06/15 0816) Pulse Rate:  [68-129] 111 (06/15 0700) Cardiac Rhythm:  [-] Atrial fibrillation (06/14 2000) Resp:  [14-42] 21 (06/15 0700) BP: (113-158)/(56-97) 123/56 mmHg (06/15 0700) SpO2:  [91 %-100 %] 92 % (06/15 0700) FiO2 (%):  [100 %] 100 % (06/14 1112) Weight:  [66.3 kg (146 lb 2.6 oz)] 66.3 kg (146 lb 2.6 oz) (06/14 1112)  No results for input(s): GLUCAP in the last 168 hours.  Recent Labs Lab 03/28/2015 0803 03/21/2015 0811  NA 136 138  K 3.9 3.9  CL 103 103  CO2 22  --   GLUCOSE 144* 145*  BUN 14 17  CREATININE 0.91 0.80  CALCIUM 9.3  --     Recent Labs Lab 03/17/2015 0803  AST 35  ALT 27  ALKPHOS 54  BILITOT 0.5  PROT 7.6  ALBUMIN 4.2    Recent Labs Lab 03/29/2015 0803 03/28/2015 0811  WBC 12.1*  --   NEUTROABS 9.6*  --   HGB 15.7* 17.7*  HCT 46.6* 52.0*  MCV 93.0  --   PLT 173  --    No results for input(s): CKTOTAL, CKMB, CKMBINDEX, TROPONINI in the last 168 hours.  Recent Labs  03/08/2015 0803 03/29/2015 1141 03/04/2015 1841 03/12/2015 2250 03/15/15 0504  LABPROT 29.7* 15.3* 15.4* 14.8 15.1  INR 2.88* 1.20 1.21 1.14 1.17    Recent Labs  03/28/2015 0758  COLORURINE  YELLOW  LABSPEC 1.014  PHURINE 7.5  GLUCOSEU NEGATIVE  HGBUR SMALL*  BILIRUBINUR NEGATIVE  KETONESUR NEGATIVE  PROTEINUR 30*  UROBILINOGEN 0.2  NITRITE POSITIVE*  LEUKOCYTESUR MODERATE*       Component Value Date/Time   CHOL 155 02/27/2010 0709   TRIG 206.0* 02/27/2010 0709   HDL 40.90 02/27/2010 0709   CHOLHDL 4 02/27/2010 0709   VLDL 41.2* 02/27/2010 0709   LDLCALC 82 09/08/2008 0924   No results found for: HGBA1C    Component Value Date/Time   LABOPIA NONE DETECTED 03/09/2015 0758   COCAINSCRNUR NONE DETECTED 03/13/2015 0758   LABBENZ NONE DETECTED 03/10/2015 0758   AMPHETMU NONE DETECTED 03/12/2015 0758   THCU NONE DETECTED 03/23/2015 0758   LABBARB NONE DETECTED 03/01/2015 0758     Recent Labs Lab 03/27/2015 0803  ETH <5    Ct Head Wo Contrast  03/15/2015   CLINICAL DATA:  Increase lethargy, intracerebral hemorrhage.  EXAM: CT HEAD WITHOUT CONTRAST  TECHNIQUE: Contiguous axial images were obtained from the base of the skull through the vertex without intravenous contrast.  COMPARISON:  03/15/2015  FINDINGS: No skull fracture is noted. Again noted large right temporal and right basal ganglia intraparenchymal hematoma. Dominant hematoma measures 5.9 x 3 cm in diameter without change  in prior exam. Again noted surrounding vasogenic edema. Stable mass effect on right lateral ventricle and minimal increased about 5 mm right to left midline shift. On prior exam was 4 mm.  Additional smaller foci of hemorrhage are again noted stable from prior exam. There is no evidence of intraventricular hemorrhage. Again noted hemispheric mass effect with sulcal effacement  IMPRESSION: Again noted large right temporal and right basal ganglia intraparenchymal hematoma. Dominant hematoma measures 5.9 x 3 cm in diameter without change in prior exam. Again noted surrounding vasogenic edema. Stable mass effect on right lateral ventricle and minimal increased about 5 mm right to left midline shift. On  prior exam was 4 mm.  Additional smaller foci of hemorrhage are again noted stable from prior exam. There is no evidence of intraventricular hemorrhage.   Electronically Signed   By: Lahoma Crocker M.D.   On: 03/15/2015 12:40   Ct Head Wo Contrast  03/15/2015   CLINICAL DATA:  Follow-up intracranial hemorrhage.  EXAM: CT HEAD WITHOUT CONTRAST  TECHNIQUE: Contiguous axial images were obtained from the base of the skull through the vertex without intravenous contrast.  COMPARISON:  03/17/2015  FINDINGS: Large right temporal and basal ganglia intraparenchymal hematoma measures 3 x 6 cm. Additional smaller foci of hemorrhage also demonstrated. Edema in the surrounding white matter. Mass effect with sulcal effacement. Right to left midline shift of 4 mm and effacement of the right lateral ventricle. No significant change since previous study. Underlying diffuse atrophy and small vessel ischemic changes.  IMPRESSION: Right temporal and basal ganglia intraparenchymal hematoma with surrounding edema and associated mass effect. 4 mm right to left midline shift. No significant change since previous study.   Electronically Signed   By: Lucienne Capers M.D.   On: 03/15/2015 05:16   Ct Head Wo Contrast  03/29/2015   CLINICAL DATA:  Right-sided weakness and slurred speech beginning 03/13/2015. The patient is anticoagulated.  EXAM: CT HEAD WITHOUT CONTRAST  TECHNIQUE: Contiguous axial images were obtained from the base of the skull through the vertex without intravenous contrast.  COMPARISON:  Brain MRI 01/28/2015 and head CT scan 01/11/2014.  FINDINGS: A large hematoma is identified centered in the right temporal lobe and right basal ganglia. There is extensive hypoattenuation in the right temporal lobe. There is mild right to left midline shift at 0.3 cm there are likely punctate foci of hemorrhage in the left aspect of the pons. There is no hydrocephalus. The brain is atrophic with chronic microvascular ischemic change. The  calvarium is intact.  IMPRESSION: Large intraparenchymal hematoma centered in the right temporal lobe and right basal ganglia with 0.3 cm right to left midline shift.  Likely punctate hemorrhage in the left pons.  Atrophy and chronic microvascular ischemic change.  Critical Value/emergent results were called by telephone at the time of interpretation on 03/05/2015 at 9:00 am to Dr. Daleen Bo , who verbally acknowledged these results.   Electronically Signed   By: Inge Rise M.D.   On: 03/04/2015 09:03   Dg Chest Portable 1 View  03/05/2015   CLINICAL DATA:  Possible stroke  EXAM: PORTABLE CHEST - 1 VIEW  COMPARISON:  01/27/2015  FINDINGS: Cardiomediastinal silhouette is stable. Atherosclerotic calcifications of thoracic aorta again noted. Stable chronic mild interstitial prominence and central bronchitic changes. Stable streaky left basilar scarring. No definite superimposed infiltrate or pulmonary edema.  IMPRESSION: Stable chronic mild interstitial prominence and central bronchitic changes. Stable streaky left basilar scarring. No definite superimposed infiltrate or pulmonary edema.  Electronically Signed   By: Lahoma Crocker M.D.   On: 03/22/2015 08:12     PHYSICAL EXAM Elderly caucasian lady in mild resp distress. . Afebrile. Head is nontraumatic. Neck is supple without bruit.    Cardiac exam no murmur or gallop. Lungs are clear to auscultation. Distal pulses are well felt. Neurological Exam :  Drowsy but can be aroused with some effort. Opens eyes. Follows simple midline and commands on the right side. Pupils are 3 mm sluggishly reactive. Corneal reflexes are present. Fundi were not visualized. Right gaze deviation and cannot cross the midline on left gaze. Vision acuity and fields cannot be reliably tested. Left lower facial asymmetry. Tongue midline. Dense left hemiplegia with hypotonia. Purposeful movements in the right upper and lower extremity and will follow commands on the right. Deep  tendon reflexes are normal on the right and depressed on the left. Right plantar downgoing left upgoing. Mild left hemi-neglect. Does not respond to painful stimuli on the left. Patient thinks her left hand belongs to me when I raise it up in front of her eyes. ASSESSMENT/PLAN Anne Hayes is a 81 y.o. female with history of atrial fibrillation on Coumadin, hypertension, hyperlipidemia, renal cancer presenting with severe headache and nausea. CT shows a large right temporal and basal ganglia hemorrhage with left midline shift. She did not receive IV t-PA due to [place].   Stroke:  Non-dominant large right temporal lobe and basal ganglia hemorrhage with cerebral edema and left shift. Given edema out of proportion to hemorrhage, suspect ischemic ifarct with hemorrhagic transformation due to Coumadin coagulopathy versus primary intracerebral hemorrhage vs hemorrhage due to cerebral amyloid angiopathy.  Repeat CT this a.m. with unchanged cerebral edema/hemorrhage  SCDs for VTE prophylaxis  Diet NPO time specified  warfarin prior to admission Dr. Leonie Man discussed diagnosis, prognosis,  treatment options and plan of care with daughter and family. They are considering 3% saline to decrease cerebral edema versus comfort care.  Overall, patient with poor neurologic prognosis for meaningful recovery.  Therapy recommendations:  pending   Disposition:  pending  (resident at the [facility] home)  Cytotoxic Cerebral edema  Per CT  Considering 3% for induced hypernatremia to decrease cerebral edema  Will require central line placement  Atrial Fibrillation  Home anticoagulation:  warfarin  INR 2.88 on admission  Reversed with Kcentra. INR down to 1.17 this a.m.   Hypertension  Home meds:   Norvasc, Cardizem  Highest blood pressure listed 097/35  Systolic blood pressure goal less than 160  Hyperlipidemia  Home meds:  Lovaza  Hyperglycemia  144-145  Other Stroke Risk  Factors  Advanced age  Other Active Problems  Baseline dementia on Aricept  Respiratory distress on 100%  nonrebreather  Other Pertinent History  History renal cancer  Hospital day # 1  I have personally examined this patient, reviewed notes, independently viewed imaging studies, participated in medical decision making and plan of care. I have made any additions or clarifications directly to the above note. Agree with note above. She has presented with a large right temporal parenchymal hemorrhage with cytotoxic edema and mass effect. Etiology is unclear possibly hemorrhagic infarct versus lobar hemorrhage from hypertension or amyloid angiopathy. Patient clearly has poor prognosis and is at risk for neurological worsening, death and will likely survive after aggressive medical therapy with major disability. I had a long discussion with the patient's daughter at the bedside and explained her prognosis and daughter is not sure at the present time if  she wants to continue aggressive care but has requested more time to discuss with her family and make a decision on palliative care.  This patient is critically ill and at significant risk of neurological worsening, death and care requires constant monitoring of vital signs, hemodynamics,respiratory and cardiac monitoring, extensive review of multiple databases, frequent neurological assessment, discussion with family, other specialists and medical decision making of high complexity.I have made any additions or clarifications directly to the above note.  I spent 50 minutes of neurocritical care time  in the care of  this patient.     Antony Contras, MD Medical Director [facility] Pager: [phone] 03/15/2015 3:46 PM     To contact Stroke Continuity provider, please refer to [email or web address] After hours, contact General Neurology

## 2015-03-15 NOTE — Progress Notes (Signed)
SLP Cancellation Note  Patient Details Name: Anne Hayes MRN: [id number] DOB: 01-21-1924   Cancelled treatment:       Reason Eval/Treat Not Completed: Medical issues which prohibited therapy. Patient nauseated. RN requested SLP check back in am 6/16.   Gabriel Rainwater MA, CCC-SLP [phone]    [name] 03/15/2015, 9:19 AM

## 2015-03-15 NOTE — Progress Notes (Signed)
Patient became less responsive and more lethargic. O2 sats had also decreased slightly from the patient's established ICU baseline. Neuro Md. Notified of changes and STAT head CT was ordered. Will take for CT and continue to monitor.

## 2015-03-16 MED ORDER — ATROPINE SULFATE 1 % OP SOLN
2.0000 [drp] | OPHTHALMIC | Status: DC | PRN
  Administered 2015-03-16 – 2015-03-17 (×8): 2 [drp] via SUBLINGUAL
  Filled 2015-03-16: qty 5

## 2015-03-16 MED ORDER — CHLORHEXIDINE GLUCONATE 0.12 % MT SOLN
15.0000 mL | Freq: Two times a day (BID) | OROMUCOSAL | Status: DC
  Administered 2015-03-16 – 2015-03-17 (×3): 15 mL via OROMUCOSAL
  Filled 2015-03-16 (×3): qty 15

## 2015-03-16 MED ORDER — CETYLPYRIDINIUM CHLORIDE 0.05 % MT LIQD
7.0000 mL | Freq: Two times a day (BID) | OROMUCOSAL | Status: DC
  Administered 2015-03-16 – 2015-03-17 (×4): 7 mL via OROMUCOSAL

## 2015-03-16 NOTE — Progress Notes (Signed)
Nutrition Brief Note  Chart reviewed. Pt now transitioning to comfort care.  No further nutrition interventions warranted at this time.  Please re-consult as needed.   Aanya Haynes A. Cheetara Hoge, RD, LDN, CDE Pager: [phone] After hours Pager: [phone]  

## 2015-03-16 NOTE — Progress Notes (Signed)
STROKE TEAM PROGRESS NOTE   HISTORY Anne Hayes is a 81 y.o. female who presents with ICH. She began having a severe headache last night and took tyelenol which "did not touch it." this morning, she was awoke with severe headache and used her life alert to call for help and was brought to the ED via EMS.  Here, she was found to have a large subcortical hemorrhage with elevated INR due to coumadin. She was last known well 03/13/2015 in the evening. Patient was not administered TPA secondary to [place]. She was admitted to the neuro ICU for further evaluation and treatment.   SUBJECTIVE (INTERVAL HISTORY) Patient has been made comfort care and transferred to hospice unit. She required atropine drops for secretions and frequent suctioning and morphine injections. Both the daughters are at the bedside   OBJECTIVE Temp:  [98.9 F (37.2 C)-101 F (38.3 C)] 101 F (38.3 C) (06/16 0625) Pulse Rate:  [78-116] 116 (06/16 0625) Cardiac Rhythm:  [-]  Resp:  [20-21] 20 (06/16 0625) BP: (123-136)/(68-89) 136/89 mmHg (06/16 0625) SpO2:  [76 %-95 %] 76 % (06/16 0625)  No results for input(s): GLUCAP in the last 168 hours.  Recent Labs Lab 03/29/2015 0803 03/20/2015 0811  NA 136 138  K 3.9 3.9  CL 103 103  CO2 22  --   GLUCOSE 144* 145*  BUN 14 17  CREATININE 0.91 0.80  CALCIUM 9.3  --     Recent Labs Lab 03/20/2015 0803  AST 35  ALT 27  ALKPHOS 54  BILITOT 0.5  PROT 7.6  ALBUMIN 4.2    Recent Labs Lab 03/01/2015 0803 03/24/2015 0811  WBC 12.1*  --   NEUTROABS 9.6*  --   HGB 15.7* 17.7*  HCT 46.6* 52.0*  MCV 93.0  --   PLT 173  --    No results for input(s): CKTOTAL, CKMB, CKMBINDEX, TROPONINI in the last 168 hours.  Recent Labs  03/02/2015 0803 03/02/2015 1141 03/10/2015 1841 03/30/2015 2250 03/15/15 0504  LABPROT 29.7* 15.3* 15.4* 14.8 15.1  INR 2.88* 1.20 1.21 1.14 1.17    Recent Labs  03/29/2015 0758  COLORURINE YELLOW  LABSPEC 1.014  PHURINE 7.5  GLUCOSEU NEGATIVE   HGBUR SMALL*  BILIRUBINUR NEGATIVE  KETONESUR NEGATIVE  PROTEINUR 30*  UROBILINOGEN 0.2  NITRITE POSITIVE*  LEUKOCYTESUR MODERATE*       Component Value Date/Time   CHOL 155 02/27/2010 0709   TRIG 206.0* 02/27/2010 0709   HDL 40.90 02/27/2010 0709   CHOLHDL 4 02/27/2010 0709   VLDL 41.2* 02/27/2010 0709   LDLCALC 82 09/08/2008 0924   No results found for: HGBA1C    Component Value Date/Time   LABOPIA NONE DETECTED 03/23/2015 0758   COCAINSCRNUR NONE DETECTED 03/17/2015 0758   LABBENZ NONE DETECTED 03/13/2015 0758   AMPHETMU NONE DETECTED 03/05/2015 0758   THCU NONE DETECTED 03/21/2015 0758   LABBARB NONE DETECTED 03/19/2015 0758     Recent Labs Lab 03/19/2015 0803  ETH <5    Ct Head Wo Contrast  03/15/2015   CLINICAL DATA:  Increase lethargy, intracerebral hemorrhage.  EXAM: CT HEAD WITHOUT CONTRAST  TECHNIQUE: Contiguous axial images were obtained from the base of the skull through the vertex without intravenous contrast.  COMPARISON:  03/15/2015  FINDINGS: No skull fracture is noted. Again noted large right temporal and right basal ganglia intraparenchymal hematoma. Dominant hematoma measures 5.9 x 3 cm in diameter without change in prior exam. Again noted surrounding vasogenic edema. Stable mass effect on  right lateral ventricle and minimal increased about 5 mm right to left midline shift. On prior exam was 4 mm.  Additional smaller foci of hemorrhage are again noted stable from prior exam. There is no evidence of intraventricular hemorrhage. Again noted hemispheric mass effect with sulcal effacement  IMPRESSION: Again noted large right temporal and right basal ganglia intraparenchymal hematoma. Dominant hematoma measures 5.9 x 3 cm in diameter without change in prior exam. Again noted surrounding vasogenic edema. Stable mass effect on right lateral ventricle and minimal increased about 5 mm right to left midline shift. On prior exam was 4 mm.  Additional smaller foci of  hemorrhage are again noted stable from prior exam. There is no evidence of intraventricular hemorrhage.   Electronically Signed   By: Lahoma Crocker M.D.   On: 03/15/2015 12:40   Ct Head Wo Contrast  03/15/2015   CLINICAL DATA:  Follow-up intracranial hemorrhage.  EXAM: CT HEAD WITHOUT CONTRAST  TECHNIQUE: Contiguous axial images were obtained from the base of the skull through the vertex without intravenous contrast.  COMPARISON:  03/13/2015  FINDINGS: Large right temporal and basal ganglia intraparenchymal hematoma measures 3 x 6 cm. Additional smaller foci of hemorrhage also demonstrated. Edema in the surrounding white matter. Mass effect with sulcal effacement. Right to left midline shift of 4 mm and effacement of the right lateral ventricle. No significant change since previous study. Underlying diffuse atrophy and small vessel ischemic changes.  IMPRESSION: Right temporal and basal ganglia intraparenchymal hematoma with surrounding edema and associated mass effect. 4 mm right to left midline shift. No significant change since previous study.   Electronically Signed   By: Lucienne Capers M.D.   On: 03/15/2015 05:16     PHYSICAL EXAM Elderly caucasian lady in mild resp distress. . Afebrile. Head is nontraumatic. Neck is supple without bruit.    Cardiac exam no murmur or gallop. Lungs are clear to auscultation. Distal pulses are well felt. Neurological Exam :  stuporose and barelyopens eyes to sternal rub. Not following even simple midline commands . Pupils are 3 mm sluggishly reactive. Corneal reflexes are present. Fundi were not visualized. Right gaze deviation and cannot cross the midline on left gaze. Vision acuity and fields cannot be reliably tested. Left lower facial asymmetry. Tongue midline. Dense left hemiplegia with hypotonia. Purposeful movements in the right upper and lower extremity . Deep tendon reflexes are normal on the right and depressed on the left. Right plantar downgoing left upgoing.  Does not respond to painful stimuli on the left.   ASSESSMENT/PLAN Anne Hayes is a 81 y.o. female with history of atrial fibrillation on Coumadin, hypertension, hyperlipidemia, renal cancer presenting with severe headache and nausea. CT shows a large right temporal and basal ganglia hemorrhage with left midline shift. She did not receive IV t-PA due to [place].   Stroke:  Non-dominant large right temporal lobe and basal ganglia hemorrhage with cerebral edema and left shift. Given edema out of proportion to hemorrhage, suspect ischemic ifarct with hemorrhagic transformation due to Coumadin coagulopathy versus primary intracerebral hemorrhage vs hemorrhage due to cerebral amyloid angiopathy.  Repeat CT this a.m. with unchanged cerebral edema/hemorrhage  SCDs for VTE prophylaxis    warfarin prior to admission Dr. Leonie Man discussed diagnosis, prognosis,  treatment options and plan of care with daughter and family. They are considering 3% saline to decrease cerebral edema versus comfort care.  Overall, patient with poor neurologic prognosis for meaningful recovery.  Therapy recommendations:  pending  Disposition:  pending  (resident at the [facility] home)  Cytotoxic Cerebral edema  Per CT  Considering 3% for induced hypernatremia to decrease cerebral edema  Will require central line placement  Atrial Fibrillation  Home anticoagulation:  warfarin  INR 2.88 on admission  Reversed with Kcentra. INR down to 1.17 this a.m.   Hypertension  Home meds:   Norvasc, Cardizem  Highest blood pressure listed 827/07  Systolic blood pressure goal less than 160  Hyperlipidemia  Home meds:  Lovaza  Hyperglycemia  144-145  Other Stroke Risk Factors  Advanced age  Other Active Problems  Baseline dementia on Aricept  Respiratory distress on 100%  nonrebreather  Other Pertinent History  History renal cancer  Hospital day # 2  I have discussed with patient's daughters comfort  care status and they appear comfortable with this decision. We'll continue present care and if patient makes it through the weekend my transfer to nursing home for hospice care if needed next week. Consulted Education officer, museum for the same     Antony Contras, [place] Pager: [phone] 03/16/2015 2:46 PM     To contact Stroke Continuity provider, please refer to [email or web address] After hours, contact General Neurology

## 2015-03-16 NOTE — Clinical Social Work Note (Signed)
Dr informed CSW that patient has been transitioned to comfort care and would potentially pass over the weekend.  If the pt is stable after the weekend the plan would be for the pt to transition to residential hospice care.  CSW met with pts daughters at bedside to discuss residential hospice- family is interested in [facility] and would like to speak with someone about their facilities.  CSW contacted [place] [facility] liaison who will follow up with patients family regarding any questions they may have.  CSW will continue to follow and see what pt disposition is on Monday.  Domenica Reamer, [place] Social Worker [phone]

## 2015-03-17 DIAGNOSIS — I618 Other nontraumatic intracerebral hemorrhage: Secondary | ICD-10-CM

## 2015-03-17 MED ORDER — FENTANYL CITRATE (PF) 100 MCG/2ML IJ SOLN
12.5000 ug | INTRAMUSCULAR | Status: DC | PRN
  Administered 2015-03-17 (×3): 25 ug via INTRAVENOUS
  Administered 2015-03-17: 12.5 ug via INTRAVENOUS
  Administered 2015-03-17 (×3): 25 ug via INTRAVENOUS
  Filled 2015-03-17 (×7): qty 2

## 2015-03-17 NOTE — Progress Notes (Signed)
STROKE TEAM PROGRESS NOTE   HISTORY Anne Hayes is a 82 y.o. female who presents with ICH. She began having a severe headache last night and took tyelenol which "did not touch it." this morning, she was awoke with severe headache and used her life alert to call for help and was brought to the ED via EMS.  Here, she was found to have a large subcortical hemorrhage with elevated INR due to coumadin. She was last known well 03/13/2015 in the evening. Patient was not administered TPA secondary to [place]. She was admitted to the neuro ICU for further evaluation and treatment.   SUBJECTIVE (INTERVAL HISTORY) Patient now on comfort care. Both the daughters are at the bedside. Pt comfortable but with [place] respirations. Family concerned. Pt has been receiving fentanyl.    OBJECTIVE Temp:  [98.7 F (37.1 C)-101.3 F (38.5 C)] 101.3 F (38.5 C) (06/17 1117) Pulse Rate:  [75] 75 (06/17 0510) Cardiac Rhythm:  [-]  Resp:  [21] 21 (06/17 0510) BP: (161)/(104) 161/104 mmHg (06/17 0510) SpO2:  [84 %] 84 % (06/17 0510)   PHYSICAL EXAM Elderly caucasian lady in mild resp distress. . Afebrile. Head is nontraumatic. Neck is supple without bruit.    Cardiac exam no murmur or gallop. Lungs are clear to auscultation. Distal pulses are well felt. Neurological Exam :  stuporose and barelyopens eyes to sternal rub. Not following even simple midline commands . Pupils are 3 mm sluggishly reactive. Corneal reflexes are present. Fundi were not visualized. Right gaze deviation and cannot cross the midline on left gaze. Vision acuity and fields cannot be reliably tested. Left lower facial asymmetry. Tongue midline. Dense left hemiplegia with hypotonia. Purposeful movements in the right upper and lower extremity . Deep tendon reflexes are normal on the right and depressed on the left. Right plantar downgoing left upgoing. Does not respond to painful stimuli on the left.   ASSESSMENT/PLAN [name] is a  81 y.o. female with history of atrial fibrillation on Coumadin, hypertension, hyperlipidemia, renal cancer presenting with severe headache and nausea. CT shows a large right temporal and basal ganglia hemorrhage with left midline shift. She did not receive IV t-PA due to [place].   Stroke:  Non-dominant large right temporal lobe and basal ganglia hemorrhage with cerebral edema and left shift. Given edema out of proportion to hemorrhage, suspect ischemic ifarct with hemorrhagic transformation due to Coumadin coagulopathy versus primary intracerebral hemorrhage vs hemorrhage due to cerebral amyloid angiopathy.  Repeat CT with unchanged cerebral edema/hemorrhage  warfarin prior to admission Dr. Leonie Man discussed diagnosis, prognosis,  treatment options and plan of care with daughter and family. Overall, patient with poor neurologic prognosis for meaningful recovery. They decided on comfrot care.  Receiving fentanyl 12.5 mg q1h prn. Had 5 doses yesterday, 4 doses so far today. Will increase range for 12.5-25. Do not feel pt is in distress. Can consider drip if respiratory struggle develops.  Disposition:  Pending. Arrnagements in place for transfer to [facility] Monday if patient survives  (resident at the [facility] home)  Cytotoxic Cerebral edema  Per CT  Atrial Fibrillation  Home anticoagulation:  warfarin  INR 2.88 on admission  Reversed with Kcentra. INR down to 1.17   Hypertension  Home meds:   Norvasc, Cardizem  Highest blood pressure listed 158/97  Hyperlipidemia  Home meds:  Lovaza  Hyperglycemia  144-145  Other Stroke Risk Factors  Advanced age  Other Active Problems  Baseline dementia on Aricept  Respiratory  distress on 100%  nonrebreather  Other Pertinent History  History renal cancer  Hospital day # [place] for Pager information 03/17/2015 2:23 PM   The patient is an 81 year old female who presented with severe  headache was brought to the emergency room via EMS, she was found to have a large subcortical hemorrhage with elevated INR secondary to Coumadin. Daughter and niece are at the bedside today. She is currently comfort measures. CT showed a large right temporal and basal ganglia hemorrhage with left midline shift. Significant edema and left shift, likely ischemic with secondary hemorrhagic transformation.   Personally examined patient and images, and have participated in and made any corrections needed to history, physical, neuro exam,assessment and plan as stated above.  Sarina Ill, MD Stroke Neurology [phone] [facility]      To contact Stroke Continuity provider, please refer to [email or web address] After hours, contact General Neurology

## 2015-03-17 NOTE — Progress Notes (Signed)
Pt. Referred by nurse. Daughter and niece was in the room. Daughter has been by bedside and is at peace with condition. Chaplain prayed for faily and Pt. Will return as needed.   03/17/15 1000  Clinical Encounter Type  Visited With Family  Visit Type Spiritual support  Referral From Nurse  Spiritual Encounters  Spiritual Needs Prayer;Emotional  Stress Factors  Family Stress Factors Major life changes

## 2015-03-17 NOTE — Consult Note (Signed)
[place] Liaison: (late entry from 03/16/15 1700) Received request from [place] 03/16/15 for family interest in possible [facility] transfer next week if patient survives weekend. Met with daughter, daughter-in-law and a friend at bedside. Explained services and answered questions regarding hospital and hospice processes. Daughter expecting hospital death. Provided [facility] booklet. Will follow up with CSW regarding availability Monday. Daughter has contact information if she would like follow up before Monday. Thank you. Erling Conte, [place]

## 2015-03-29 NOTE — Discharge Summary (Signed)
Patient ID: Anne Hayes MRN: [id number] DOB/AGE: [birth year] 81 y.o.  Admit date: 03/28/2015 Death date: 2015/04/01 4 07 am Admission Diagnoses: Headache  Cause of Death:  Non-dominant large right temporal lobe and basal ganglia intracerebral hemorrhage with cerebral edema and left shift. Etiology unclear-ischemic ifarct with hemorrhagic transformation due to Coumadin coagulopathy versus primary intracerebral hemorrhage vs hemorrhage due to cerebral amyloid angiopathy. Patient made DO NOT RESUSCITATE and comfort care at request of family  Pertinent Medical Diagnosis: Active Problems:   ICH (intracerebral hemorrhage)   Hemorrhage while on warfarin therapy   Atrial fibrillation with RVR   Cytotoxic cerebral edema   Hypoxia   Hospital Course: Anne Hayes is a 81 y.o. female who presents with ICH. She began having a severe headache last night and took tyelenol which "did not touch it." this morning, she was awoke with severe headache and used her life alert to call for help and was brought to the ED via EMS.  Here, she was found to have a large subcortical hemorrhage with elevated INR due to coumadin. She was last known well 03/13/2015 in the evening. Patient was not administered TPA secondary to [place]. She was admitted to the neuro ICU for further evaluation and treatment. Her INR was reversed using K Katharine Look and follow-up CT scan showed stable appearance to the patient's neurological exam remained poor with right gaze deviation dense left hemiplegia and unresponsiveness. The patient's family understood poor prognosis and chances of survival without major disability and going to a nursing home were negligible. Family was clear patient did not want that and hence they made the patient DO NOT RESUSCITATE and comfort care. Patient was transferred to the hospice unit where she was kept comfortable her condition gradually declined and she passed away on Apr 01, 2015 at 42 7 AM.  03/15/2015 CLINICAL DATA:  Increase lethargy, intracerebral hemorrhage. EXAM: CT HEAD WITHOUT CONTRAST TECHNIQUE: Contiguous axial images were obtained from the base of the skull through the vertex without intravenous contrast. COMPARISON: 03/15/2015 FINDINGS: No skull fracture is noted. Again noted large right temporal and right basal ganglia intraparenchymal hematoma. Dominant hematoma measures 5.9 x 3 cm in diameter without change in prior exam. Again noted surrounding vasogenic edema. Stable mass effect on right lateral ventricle and minimal increased about 5 mm right to left midline shift. On prior exam was 4 mm. Additional smaller foci of hemorrhage are again noted stable from prior exam. There is no evidence of intraventricular hemorrhage. Again noted hemispheric mass effect with sulcal effacement IMPRESSION: Again noted large right temporal and right basal ganglia intraparenchymal hematoma. Dominant hematoma measures 5.9 x 3 cm in diameter without change in prior exam. Again noted surrounding vasogenic edema. Stable mass effect on right lateral ventricle and minimal increased about 5 mm right to left midline shift. On prior exam was 4 mm. Additional smaller foci of hemorrhage are again noted stable from prior exam. There is no evidence of intraventricular hemorrhage. Electronically Signed By: Lahoma Crocker M.D. On: 03/15/2015 12:40   Ct Head Wo Contrast  03/15/2015 CLINICAL DATA: Follow-up intracranial hemorrhage. EXAM: CT HEAD WITHOUT CONTRAST TECHNIQUE: Contiguous axial images were obtained from the base of the skull through the vertex without intravenous contrast. COMPARISON: 2015-03-28 FINDINGS: Large right temporal and basal ganglia intraparenchymal hematoma measures 3 x 6 cm. Additional smaller foci of hemorrhage also demonstrated. Edema in the surrounding white matter. Mass effect with sulcal effacement. Right to left midline shift of 4 mm and effacement of the right lateral ventricle. No  significant  change since previous study. Underlying diffuse atrophy and small vessel ischemic changes. IMPRESSION: Right temporal and basal ganglia intraparenchymal hematoma with surrounding edema and associated mass effect. 4 mm right to left midline shift. No significant change since previous study. Electronically Signed By: Lucienne Capers M.D. On: 03/15/2015 05:16   Ct Head Wo Contrast  03/13/2015 CLINICAL DATA: Right-sided weakness and slurred speech beginning 03/13/2015. The patient is anticoagulated. EXAM: CT HEAD WITHOUT CONTRAST TECHNIQUE: Contiguous axial images were obtained from the base of the skull through the vertex without intravenous contrast. COMPARISON: Brain MRI 01/28/2015 and head CT scan 01/11/2014. FINDINGS: A large hematoma is identified centered in the right temporal lobe and right basal ganglia. There is extensive hypoattenuation in the right temporal lobe. There is mild right to left midline shift at 0.3 cm there are likely punctate foci of hemorrhage in the left aspect of the pons. There is no hydrocephalus. The brain is atrophic with chronic microvascular ischemic change. The calvarium is intact. IMPRESSION: Large intraparenchymal hematoma centered in the right temporal lobe and right basal ganglia with 0.3 cm right to left midline shift. Likely punctate hemorrhage in the left pons. Atrophy and chronic microvascular ischemic change. Critical Value/emergent results were called by telephone at the time of interpretation on 03/04/2015 at 9:00 am to Dr. Daleen Bo , who verbally acknowledged these results. Electronically Signed By: Inge Rise M.D. On: 03/11/2015 09:03   Signed: Antony Contras 03/29/2015, 2:53 PM

## 2015-03-31 NOTE — Progress Notes (Signed)
Pt daughter came and gave the funeral name Guerry Bruin and Barbarann Ehlers in [place] , [place].

## 2015-03-31 NOTE — Progress Notes (Signed)
I observed pt unresponsive, pulseless verified by another nurse Beverely Low, RN she died at 43, charge nurse, on call Neurology MD -Yevonne Aline, her daughter Jeannene Patella Apple  informed, [place] [facility]  informed with reference no [phone], not a candidate for organ donor, declined autopsy, her daughter said she's coming today

## 2015-03-31 DEATH — deceased
